# Patient Record
Sex: Female | Born: 1935 | Race: White | Hispanic: No | Smoking: Never smoker
Health system: Southern US, Community
[De-identification: ages and names within clinical notes are randomized; demographics above are authoritative.]

## PROBLEM LIST (undated history)

## (undated) DIAGNOSIS — N6019 Diffuse cystic mastopathy of unspecified breast: Secondary | ICD-10-CM

## (undated) DIAGNOSIS — K4020 Bilateral inguinal hernia, without obstruction or gangrene, not specified as recurrent: Secondary | ICD-10-CM

## (undated) DIAGNOSIS — E782 Mixed hyperlipidemia: Secondary | ICD-10-CM

## (undated) DIAGNOSIS — G5601 Carpal tunnel syndrome, right upper limb: Secondary | ICD-10-CM

## (undated) DIAGNOSIS — M47817 Spondylosis without myelopathy or radiculopathy, lumbosacral region: Secondary | ICD-10-CM

## (undated) DIAGNOSIS — I10 Essential (primary) hypertension: Secondary | ICD-10-CM

## (undated) DIAGNOSIS — K439 Ventral hernia without obstruction or gangrene: Secondary | ICD-10-CM

## (undated) DIAGNOSIS — I341 Nonrheumatic mitral (valve) prolapse: Secondary | ICD-10-CM

## (undated) DIAGNOSIS — E039 Hypothyroidism, unspecified: Secondary | ICD-10-CM

## (undated) DIAGNOSIS — C19 Malignant neoplasm of rectosigmoid junction: Secondary | ICD-10-CM

## (undated) DIAGNOSIS — Z9889 Other specified postprocedural states: Secondary | ICD-10-CM

## (undated) DIAGNOSIS — K458 Other specified abdominal hernia without obstruction or gangrene: Secondary | ICD-10-CM

## (undated) DIAGNOSIS — K635 Polyp of colon: Secondary | ICD-10-CM

## (undated) DIAGNOSIS — J449 Chronic obstructive pulmonary disease, unspecified: Secondary | ICD-10-CM

## (undated) DIAGNOSIS — R112 Nausea with vomiting, unspecified: Secondary | ICD-10-CM

## (undated) DIAGNOSIS — R350 Frequency of micturition: Secondary | ICD-10-CM

## (undated) DIAGNOSIS — N951 Menopausal and female climacteric states: Secondary | ICD-10-CM

## (undated) DIAGNOSIS — Z85828 Personal history of other malignant neoplasm of skin: Secondary | ICD-10-CM

## (undated) HISTORY — DX: Mixed hyperlipidemia: E78.2

## (undated) HISTORY — DX: Nonrheumatic mitral (valve) prolapse: I34.1

## (undated) HISTORY — DX: Diffuse cystic mastopathy of unspecified breast: N60.19

## (undated) HISTORY — DX: Bilateral inguinal hernia, without obstruction or gangrene, not specified as recurrent: K40.20

## (undated) HISTORY — DX: Other specified abdominal hernia without obstruction or gangrene: K45.8

## (undated) HISTORY — DX: Malignant neoplasm of rectosigmoid junction: C19

## (undated) HISTORY — DX: Chronic obstructive pulmonary disease, unspecified: J44.9

## (undated) HISTORY — DX: Essential (primary) hypertension: I10

## (undated) HISTORY — DX: Spondylosis without myelopathy or radiculopathy, lumbosacral region: M47.817

## (undated) HISTORY — DX: Menopausal and female climacteric states: N95.1

## (undated) HISTORY — PX: TONSILLECTOMY: SUR1361

## (undated) HISTORY — PX: APPENDECTOMY: SHX54

## (undated) HISTORY — DX: Polyp of colon: K63.5

## (undated) HISTORY — PX: CATARACT EXTRACTION W/ INTRAOCULAR LENS  IMPLANT, BILATERAL: SHX1307

---

## 1998-11-17 ENCOUNTER — Other Ambulatory Visit: Admission: RE | Admit: 1998-11-17 | Discharge: 1998-11-17 | Payer: Self-pay | Admitting: Family Medicine

## 1999-12-02 ENCOUNTER — Other Ambulatory Visit: Admission: RE | Admit: 1999-12-02 | Discharge: 1999-12-02 | Payer: Self-pay | Admitting: Family Medicine

## 2001-02-25 ENCOUNTER — Other Ambulatory Visit: Admission: RE | Admit: 2001-02-25 | Discharge: 2001-02-25 | Payer: Self-pay | Admitting: Family Medicine

## 2002-10-30 HISTORY — PX: COLON SURGERY: SHX602

## 2003-03-19 ENCOUNTER — Other Ambulatory Visit: Admission: RE | Admit: 2003-03-19 | Discharge: 2003-03-19 | Payer: Self-pay | Admitting: Family Medicine

## 2003-04-16 ENCOUNTER — Ambulatory Visit (HOSPITAL_COMMUNITY): Admission: RE | Admit: 2003-04-16 | Discharge: 2003-04-16 | Payer: Self-pay | Admitting: Gastroenterology

## 2003-04-16 ENCOUNTER — Encounter (INDEPENDENT_AMBULATORY_CARE_PROVIDER_SITE_OTHER): Payer: Self-pay | Admitting: *Deleted

## 2003-05-15 ENCOUNTER — Encounter: Payer: Self-pay | Admitting: General Surgery

## 2003-05-15 ENCOUNTER — Ambulatory Visit (HOSPITAL_COMMUNITY): Admission: RE | Admit: 2003-05-15 | Discharge: 2003-05-15 | Payer: Self-pay | Admitting: General Surgery

## 2003-06-11 ENCOUNTER — Inpatient Hospital Stay (HOSPITAL_COMMUNITY): Admission: RE | Admit: 2003-06-11 | Discharge: 2003-06-20 | Payer: Self-pay | Admitting: General Surgery

## 2003-06-11 ENCOUNTER — Encounter (INDEPENDENT_AMBULATORY_CARE_PROVIDER_SITE_OTHER): Payer: Self-pay | Admitting: Specialist

## 2004-04-04 ENCOUNTER — Other Ambulatory Visit: Admission: RE | Admit: 2004-04-04 | Discharge: 2004-04-04 | Payer: Self-pay | Admitting: Family Medicine

## 2004-07-20 ENCOUNTER — Ambulatory Visit (HOSPITAL_COMMUNITY): Admission: RE | Admit: 2004-07-20 | Discharge: 2004-07-20 | Payer: Self-pay | Admitting: Gastroenterology

## 2004-07-20 ENCOUNTER — Encounter (INDEPENDENT_AMBULATORY_CARE_PROVIDER_SITE_OTHER): Payer: Self-pay | Admitting: *Deleted

## 2006-12-07 ENCOUNTER — Other Ambulatory Visit: Admission: RE | Admit: 2006-12-07 | Discharge: 2006-12-07 | Payer: Self-pay | Admitting: Family Medicine

## 2007-10-17 ENCOUNTER — Ambulatory Visit (HOSPITAL_COMMUNITY): Admission: RE | Admit: 2007-10-17 | Discharge: 2007-10-17 | Payer: Self-pay | Admitting: Ophthalmology

## 2009-07-28 LAB — HM DEXA SCAN

## 2010-03-10 LAB — FECAL OCCULT BLOOD, GUAIAC: Fecal Occult Blood: NEGATIVE

## 2011-02-07 ENCOUNTER — Encounter: Payer: Self-pay | Admitting: Family Medicine

## 2011-02-07 DIAGNOSIS — C19 Malignant neoplasm of rectosigmoid junction: Secondary | ICD-10-CM

## 2011-03-17 NOTE — Op Note (Signed)
Brianna [name], Brianna [name]                           ACCOUNT NO.:  [id number]   MEDICAL RECORD NO.:  [id number]                   PATIENT TYPE:  AMB   LOCATION:  ENDO                                 FACILITY:  [facility]   PHYSICIAN:  Petra Kuba, M.D.                 DATE OF BIRTH:  June 26, 1925   DATE OF PROCEDURE:  05/16/2003  DATE OF DISCHARGE:                                 OPERATIVE REPORT   PROCEDURE:  Colonoscopy with polypectomy.   INDICATIONS FOR PROCEDURE:  Chronic constipation, well overdue  for colonic  screening. Consent was signed  after the risks and benefits, methods and  options were thoroughly discussed in the office.   MEDICATIONS:  Demerol 50, Versed 5.   DESCRIPTION OF PROCEDURE:  The rectal inspection was pertinent for small  external hemorrhoids. The digital examination was negative. The pediatric  video adjustable colonoscope was inserted, and despite a tortuous colon, we  were able to advance to the cecum.   On insertion a large rectal polyp was seen and 2 small  polyps were seen  approximately  at the level of the hepatic flexure. To advance to the cecum  did require rolling her on her back and some abdominal pressure. The cecum  was identified by the appendiceal orifice and the ileocecal valve and we  advanced  into the terminal ileum for a quick look which was normal. The  scope was slowly withdrawn.   The prep was adequate. There was minimal liquid stool that required washing  and suctioning. On slow withdrawal through the colon 2 tiny and small polyps  in the hepatic flexure were seen and hot biopsied x1 and put into the first  container. The scope was further  withdrawn.   In the mid sigmoid at about 30 cm a semi-sessile polyp was seen and snared  x3 and the polyp  was suctioned through the scope and collected in the trap  except for the largest piece which was suctioned onto the head of the scope,  and the scope was withdrawn. We reinserted the scope to  the polypectomy site  and there seemed to be possibly a slight amount of residual polypoid tissue  and 1 hot biopsy of that are was done. These were all put in the 2nd  container.   The scope was then slowly withdrawn back to the rectum and just past the 1st  rectal fold at approximately the sigmoid rectal junction was a large sessile  polyp. We went ahead and took 5 snares of it and pieces were suctioned  through the scope and collected in the trap or suction onto the head of the  scope and removed.   We then went ahead  and took a few hot biopsies of the edge  and put that in  a 4th container. The polyp was not completely removed, but based on the  amount of  cautery done at that junction we elected not to be any further  aggressive, and since we did not know the pathology, elected not to use our  Irby argon plasma coagulator at this time.   The scope was retroflexed which revealed some small internal hemorrhoids.  Again the large rectal polyp was washed and watched. There was no active  bleeding, and again based on the degree of cautery, we elected not  to do  any further therapy. The scope was inserted past the other  polypectomy  site, air was suctioned. The scope was removed.   The patient tolerated the procedure well. There were no obvious immediate  complications.   ENDOSCOPIC DIAGNOSES:  1. Internal and external hemorrhoids.  2. Tortuous colon.  3. Proximal rectal large sessile polyp with 5 snares put in a 3rd container     and the edge hot biopsied and put in the 4th container. It was not     completely removed.  4. Midsigmoid moderately semi-sessile polyp with 3 snares and hot biopsy of     the edge put in the 2nd container.  5. Two tiny small  hepatic flexure polyps, hot biopsied.  6. Otherwise within normal limits to the terminal ileum.   PLAN:  Await pathology to determine surgical options or repeat colonoscopy  with probably using the Irby argon plasma coagulator.  I have discussed post  polypectomy instructions and I have discussed with the patient and her  daughter how there is a small  chance that this dose have some cancerous  cells and we may need to proceed with surgical options, but how I did feel  like there was enough  room for a low anterior resection and not need an  ostomy.                                                 Petra Kuba, M.D.    MEM/MEDQ  D:  04/16/2003  T:  04/17/2003  Job:  [id number]   cc:   Ernestina Penna, M.D.  [address] [place]  [place] [address]  Fax: [phone]

## 2011-03-17 NOTE — Op Note (Signed)
NAMEKRISHANA, Brianna [name]                           ACCOUNT NO.:  [id number]   MEDICAL RECORD NO.:  [id number]                   PATIENT TYPE:  AMB   LOCATION:  DAY                                  FACILITY:  [facility]   PHYSICIAN:  Timothy E. Earlene Plater, M.D.              DATE OF BIRTH:  19-Apr-1925   DATE OF PROCEDURE:  05/16/2003  DATE OF DISCHARGE:                                 OPERATIVE REPORT   PREOPERATIVE DIAGNOSIS:  Villous adenoma of the rectum.   POSTOPERATIVE DIAGNOSIS:  Villous adenoma of the rectum.   PROCEDURES:  1. Examination under anesthesia.  2. Rectal ultrasound.   SURGEON:  Timothy E. Earlene Plater, M.D.   ANESTHESIA:  General.   Brianna [name] was being seen and evaluated by Petra Kuba, M.D., on referral  from Ernestina Penna, M.D., for rectal bleeding, and a colonoscopy was  accomplished revealing multiple villous adenomata; however, a large villous  was found to contain a focal area of intramucosal adenocarcinoma.  The  tissue was pinch biopsies only.  The patient had been seen and evaluated in  the office and is seen today for exam under anesthesia, biopsy if indicated,  and ultrasound.   She was identified and the permit signed and lab data evaluated.   She was taken to the operating room and placed supine, LMA anesthesia  provided.  She was placed in lithotomy.  The perianal area was inspected,  prepped and draped in the usual fashion.  The standard proctoscope was  introduced and advanced with copious lavage, as the patient was not well  cleaned out, and exactly at 14 cm on the right anterior wall of the rectum  was a suspicious-appearing tumor, and grossly it appeared to be carcinoma.  This also appeared to be by gross anatomy to be at the rectosigmoid  junction.  I removed the standard proctoscope, introduced the  ultrasonographic proctoscope, and then introduced the B&K 2201 model  intrarectal ultrasound probe with the water-filled balloon attached.  Multiple  images were made and have been evaluated to indicate a submucosal  component in the right anterior position, as well thickening of the mucosa  as would be expected.  I do not see extension of the tumor beyond this, and  no lymph nodes were clearly identified.  I do believe this, however, to be a  T3 lesion.  The scope was withdrawn and no complications.  The patient was  awakened and taken to the recovery room.   Written and verbal instructions were given her daughter, who attends to her  business with her.  The patient will need a rectosigmoidectomy and low  anterior resection for complete removal of the tumor and clear delineation  of the pathology.  The patient's daughter agrees and understands and will  proceed with scheduling.  Timothy E. Earlene Plater, M.D.    TED/MEDQ  D:  05/15/2003  T:  05/16/2003  Job:  [id number]   cc:   Ernestina Penna, M.D.  [address] [place]  [place] [address]  Fax: [phone]   Petra Kuba, M.D.  [address]  [place]  [place] [address]  Fax: [phone]

## 2011-03-17 NOTE — Op Note (Signed)
Brianna [name], Brianna [name]               ACCOUNT NO.:  [id number]   MEDICAL RECORD NO.:  [id number]          PATIENT TYPE:  AMB   LOCATION:  ENDO                         FACILITY:  [facility]   PHYSICIAN:  Petra Kuba, M.D.    DATE OF BIRTH:  [birth year]   DATE OF PROCEDURE:  07/20/2004  DATE OF DISCHARGE:                                 OPERATIVE REPORT   PROCEDURE:  Colonoscopy with hot biopsy.   INDICATIONS:  Patient with a history of colon cancer and colon polyps, due  for repeat screening.   Consent was signed after risks, benefits, methods, options thoroughly  discussed in the office.   MEDICINES USED:  Demerol 50, Versed 5.   PROCEDURE:  Rectal inspection is pertinent for external hemorrhoids, small.  Digital exam was negative.  The video pediatric adjustable colonoscope was  inserted and easily advanced around the colon to the cecum.  The anastomosis  was seen in the distal sigmoid.  We did require some abdominal pressure to  advance to the cecum.  No obvious abnormality was seen on insertion.  The  cecum was identified by the appendiceal orifice and the ileocecal valve.  The prep was adequate.  There was some liquid stool that required washing  and suctioning.  In the ascending colon, three small sessile polyps were  seen and were all hot biopsied x1-2 and put in the same container.  In the  hepatic flexure and proximal transverse, two additional small polyps were  seen and were hot biopsied as well and put in the same containers.  The  scope was slowly withdrawn.  There was a rare left-sided diverticula seen.  Two tiny mid descending and distal sigmoid polyps were seen, were hot  biopsied, and put in a second container.  The anastomosis appeared normal;  however, there were some slightly raised folds that we went ahead and  biopsied, probably from the sutures.  They were put in the third container.  Anorectal pull-through and retroflexion confirmed some small hemorrhoids.  The  scope was straightened and readvanced a shortways up the left side of  the colon, air was suctioned, and the scope removed.  Patient tolerated the  procedure well.  There was no obvious immediate complication.   ENDOSCOPIC DIAGNOSES:  1.  Internal/external hemorrhoids.  2.  Rare left-sided diverticula.  3.  Sigmoid distal anastomosis, status post biopsy.  4.  Two tiny distal sigmoid and descending polyps, hot biopsied.  5.  Right-sided small sessile polyps, 5-6, hot biopsied.  6.  Otherwise within normal limits to the cecum.   PLAN:  Await pathology to determine future colonic screening.  Happy to see  back p.r.n., otherwise return to the care of Dr. Christell Constant for the customary  health-care maintenance to include yearly rectals and guaiacs.      MEM/MEDQ  D:  07/20/2004  T:  07/20/2004  Job:  [id number]   cc:   Ernestina Penna, M.D.  [address] [place]  [place] [address]  Fax: [phone]   Sheppard Plumber. Earlene Plater, M.D.  [address]  [place]  [place] [address]  Fax: [phone]

## 2011-03-17 NOTE — Op Note (Signed)
Brianna [name], Brianna [name]                           ACCOUNT NO.:  [id number]   MEDICAL RECORD NO.:  [id number]                   PATIENT TYPE:  INP   LOCATION:  X003                                 FACILITY:  [facility]   PHYSICIAN:  Timothy E. Earlene Plater, M.D.              DATE OF BIRTH:  08/22/1925   DATE OF PROCEDURE:  06/11/2003  DATE OF DISCHARGE:                                 OPERATIVE REPORT   PREOPERATIVE DIAGNOSIS:  Carcinoma of the rectum.   POSTOPERATIVE DIAGNOSIS:  Carcinoma of the rectum.   OPERATION PERFORMED:  Rigid sigmoidoscopy with tattoo and low anterior  resection of a rectal carcinoma with a primary anastomosis.   SURGEON:  Timothy E. Earlene Plater, M.D.   ASSISTANT:  Currie Paris, M.D.   ANESTHESIA:  CRNA supervised M.D.   INDICATIONS FOR PROCEDURE:  This is a 77 year old Caucasian female who sees  Dr. Vernon Prey.  She had a routine colonoscopy by Dr. Vida Rigger in May, a  number of tubular adenomas were excised during colonoscopy and one tumor of  the rectum was excised in fragments and showed superficial adenocarcinoma.  The patient has undergone staging by both ultrasonography and scans and is  ready to proceed with a definitive resection.  She was prepared at home, had  considerable nausea, was brought in early this morning.  An IV was started.  Fluids were given.  She was seen, evaluated by anesthesia.  Her laboratory  data are all normal including a normal CEA level.  She said she is ready to  proceed with surgery.  She is identified and the permit signed.   DESCRIPTION OF PROCEDURE:  The patient was taken to the operating room and  placed supine.  General endotracheal anesthesia administered.  PAS hose were  in place.  Nasogastric tube was placed.  A Foley catheter was placed.  The  patient was placed in lithotomy position.  Sigmoidoscopy accomplished.  The  tumor identified at exactly 15 cm on rigid proctoscopy.  Tattooing was  accomplished and the scope  withdrawn.  The patient's perineum was prepped  and then the legs were lowered and then the abdomen was prepped separately.  A long lower abdominal midline incision was made.  The abdomen was entered.  There were no adhesions, careful evaluation of the remainder of the colon  with small bowel was negative for any other masses or tumors.  The appendix  was absent.  The internal genitalia were present.  There was polypoid masses  on the uterus.  The ovaries were atrophic.  The tumor was palpable on the  anterior surface of the highest portion of the rectum at its junction with  the sigmoid colon.  Likewise a small amount of tattoo stain could be seen.  This area was marked.  The liver, gallbladder and upper abdomen were felt to  be normal and the NG tube was positioned.  The  uterus was suspended  inferiorly.  The sigmoid colon was remarkably redundant and because of this,  we decided to resect additional sigmoid colon that would ordinarily be  necessary to prevent any malrotation postoperatively.  The ureters were both  visible through the peritoneum and were kept lateral to the dissection.  The  peritoneum was scored from 4 cm below the tumor along the pelvic rim and up  to a point on the sigmoid colon that was at the proximal sigmoid.  Once that  was accomplished, the area of the sigmoid colon destined for resection was  divided between clamps.  The ends were washed with dilute Betadine and then  the mesentery was divided between clamps carefully down to sacral promontory  and then down into the sacral hollow beyond the tumor distally.  All of  these were securely tied with 2-0 silks.  Then the rectum was carefully  cleared of mesentery at a distance we estimated to be 4 cm below the tumor.  A clamp was placed approximately 6 to 8 cm below the tumor and the rectum  was then divided at what we estimated to be 3 to 4 cm below the tumor.  Specimen was passed off the field.  I did open the  distal specimen and the  tumor was included with the specimen.  It was sent fresh to pathology.  So  the remaining sigmoid easily reached the midpelvis and under direct vision,  a handsewn anastomosis was carried out by placing marker sutures of 2-0 silk  approximating the ends of the bowel and then intervening 3-0 silks were  placed.  The lateral walls of the back margin were placed first and then all  tied at one time.  The colon lay nicely without tension.  Then the corners  and anterior surface were closed in a like fashion.  The anastomosis  appeared well, it was manually and visually inspected and then I went below,  passed a proctoscope.  The new anastomosis lay at about 10 cm and with a  clamp on the descending colon, air was insufflated into the rectum and  sigmoid and there was no air leak.  This was done, of course with saline  filling the pelvis.  The sigmoidoscope was removed.  Instruments and gloves  changed.  The counts were reported as preliminary correct.  The anastomosis  lay nicely in the midpelvis.  The peritoneum could be easily closed on the  medial pelvis and this was done with silk sutures.  All areas were checked  and all the bowel was brought back into its normal anatomic position and all  final counts were correct.  The abdomen was closed in a single layer with #1  PDS suture.  Subcutaneous irrigated.  Skin closed with wide skin staples.  The patient tolerated the procedure well and was removed to the recovery  room in good condition.                                                Timothy E. Earlene Plater, M.D.    TED/MEDQ  D:  06/11/2003  T:  06/11/2003  Job:  [id number]   cc:   Petra Kuba, M.D.  [address]  [place]  [place] [address]  Fax: [phone]   Ernestina Penna, M.D.  [address]  [place]  [place] [address]  Fax: [phone]

## 2011-03-17 NOTE — Discharge Summary (Signed)
   NAMECRISLYN, [name]                           ACCOUNT NO.:  [id number]   MEDICAL RECORD NO.:  [id number]                   PATIENT TYPE:  INP   LOCATION:  0352                                 [facility]ACILITY:  [facility]   PHYSICIAN:  Timothy E. Earlene Plater, M.D.              DATE OF BIRTH:  December 25, 1924   DATE OF ADMISSION:  06/11/2003  DATE OF DISCHARGE:  06/20/2003                                 DISCHARGE SUMMARY   FINAL DIAGNOSES:  Carcinoma rectosigmoid.   HISTORY:  See the enclosed notes.  The patient was seen and evaluated by  Ernestina Penna, M.D., Petra Kuba, M.D.  Colonoscopy showed carcinoma in  the rectosigmoid area.  She was appropriately counseled and prepared for  this surgery.  She was admitted on the day of surgery following outpatient  prep.  Her admitting laboratory data showed a normal CBC, normal chemistry  profile, CEA level 1.7.   She underwent surgery including sigmoidoscopy with tattooing of tumor, low  anterior resection of a high rectal carcinoma with a primary anastomosis.  The patient remained in good spirits, remained alert and appropriate with  satisfactory postoperative progress in terms of bowel function.  She was  ambulatory.  Incision remained clean.  Bowel action did not resume until the  sixth postoperative day when she was tolerating liquids.  Laboratory data  were rechecked at that time and she was advanced on diet and ready for  discharge on August 21.  Staples out.  Steri-Strips applied.  Careful  instructions given.  Vicodin.  Assignment was made for postoperative follow-  up.  A postoperative CBC was also normal.  Postoperative chemistries were  followed.   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  The patient will be  appropriately consulted and appropriate follow-up recommended.                                               Timothy E. Earlene Plater, M.D.    TED/MEDQ  D:   07/07/2003  T:  07/07/2003  Job:  [id number]   cc:   Ernestina Penna, M.D.  [address] [place]  [place] [address]  Fax: [phone]   Petra Kuba, M.D.  [address]  [place]  [place] [address]  Fax: [phone]

## 2011-05-22 ENCOUNTER — Ambulatory Visit: Payer: Medicare Other | Attending: Orthopedic Surgery | Admitting: Physical Therapy

## 2011-05-22 DIAGNOSIS — M545 Low back pain, unspecified: Secondary | ICD-10-CM | POA: Insufficient documentation

## 2011-05-22 DIAGNOSIS — IMO0001 Reserved for inherently not codable concepts without codable children: Secondary | ICD-10-CM | POA: Insufficient documentation

## 2011-05-22 DIAGNOSIS — R5381 Other malaise: Secondary | ICD-10-CM | POA: Insufficient documentation

## 2011-05-22 DIAGNOSIS — R293 Abnormal posture: Secondary | ICD-10-CM | POA: Insufficient documentation

## 2011-05-29 ENCOUNTER — Ambulatory Visit: Payer: Medicare Other | Admitting: Physical Therapy

## 2011-08-07 LAB — BASIC METABOLIC PANEL
Chloride: 101
Creatinine, Ser: 0.83
GFR calc Af Amer: >60
GFR calc non Af Amer: >60
Potassium: 4.1

## 2011-08-07 LAB — HEMOGLOBIN AND HEMATOCRIT, BLOOD
HCT: 36.9
Hemoglobin: 12.4

## 2011-10-06 ENCOUNTER — Encounter (INDEPENDENT_AMBULATORY_CARE_PROVIDER_SITE_OTHER): Payer: Self-pay | Admitting: Surgery

## 2011-10-12 ENCOUNTER — Encounter (INDEPENDENT_AMBULATORY_CARE_PROVIDER_SITE_OTHER): Payer: Self-pay | Admitting: Surgery

## 2011-10-12 ENCOUNTER — Ambulatory Visit (INDEPENDENT_AMBULATORY_CARE_PROVIDER_SITE_OTHER): Payer: Medicare Other | Admitting: Surgery

## 2011-10-12 VITALS — BP 154/96 | HR 76 | Temp 96.9°F | Resp 16 | Ht 60.5 in | Wt 116.4 lb

## 2011-10-12 DIAGNOSIS — M1990 Unspecified osteoarthritis, unspecified site: Secondary | ICD-10-CM | POA: Insufficient documentation

## 2011-10-12 DIAGNOSIS — K432 Incisional hernia without obstruction or gangrene: Secondary | ICD-10-CM | POA: Insufficient documentation

## 2011-10-12 NOTE — Patient Instructions (Signed)
Hernia Repair with Laparoscope A hernia occurs when an internal organ pushes out through a weak spot in the belly (abdominal) wall muscles. Hernias most commonly occur in the groin and around the navel. Hernias can also occur through a cut by the surgeon (incision) after an abdominal operation. A hernia may be caused by:  Lifting heavy objects.   Prolonged coughing.   Straining to move your bowels.  Hernias can often be pushed back into place (reduced). Most hernias tend to get worse over time. Problems occur when abdominal contents get stuck in the opening and the blood supply is blocked or impaired (incarcerated hernia). Because of these risks, you require surgery to repair the hernia. Your hernia will be repaired using a laparoscope. Laparoscopic surgery is a type of minimally invasive surgery. It does not involve making a typical surgical cut (incision) in the skin. A laparoscope is a telescope-like rod and lens system. It is usually connected to a video camera and a light source so your caregiver can clearly see the operative area. The instruments are inserted through  to  inch (5 mm or 10 mm) openings in the skin at specific locations. A working and viewing space is created by blowing a small amount of carbon dioxide gas into the abdominal cavity. The abdomen is essentially blown up like a balloon (insufflated). This elevates the abdominal wall above the internal organs like a dome. The carbon dioxide gas is common to the human body and can be absorbed by tissue and removed by the respiratory system. Once the repair is completed, the small incisions will be closed with either stitches (sutures) or staples (just like a paper stapler only this staple holds the skin together). LET YOUR CAREGIVERS KNOW ABOUT:  Allergies.   Medications taken including herbs, eye drops, over the counter medications, and creams.   Use of steroids (by mouth or creams).   Previous problems with anesthetics or  Novocaine.   Possibility of pregnancy, if this applies.   History of blood clots (thrombophlebitis).   History of bleeding or blood problems.   Previous surgery.   Other health problems.  BEFORE THE PROCEDURE  Laparoscopy can be done either in a hospital or out-patient clinic. You may be given a mild sedative to help you relax before the procedure. Once in the operating room, you will be given a general anesthesia to make you sleep (unless you and your caregiver choose a different anesthetic).  AFTER THE PROCEDURE  After the procedure you will be watched in a recovery area. Depending on what type of hernia was repaired, you might be admitted to the hospital or you might go home the same day. With this procedure you may have less pain and scarring. This usually results in a quicker recovery and less risk of infection. HOME CARE INSTRUCTIONS   Bed rest is not required. You may continue your normal activities but avoid heavy lifting (more than 10 pounds) or straining.   Cough gently. If you are a smoker it is best to stop, as even the best hernia repair can break down with the continual strain of coughing.   Avoid driving until given the OK by your surgeon.   There are no dietary restrictions unless given otherwise.   TAKE ALL MEDICATIONS AS DIRECTED.   Only take over-the-counter or prescription medicines for pain, discomfort, or fever as directed by your caregiver.  SEEK MEDICAL CARE IF:   There is increasing abdominal pain or pain in your incisions.     There is more bleeding from incisions, other than minimal spotting.   You feel light headed or faint.   You develop an unexplained fever, chills, and/or an oral temperature above 102 F (38.9 C).   You have redness, swelling, or increasing pain in the wound.   Pus coming from wound.   A foul smell coming from the wound or dressings.  SEEK IMMEDIATE MEDICAL CARE IF:   You develop a rash.   You have difficulty breathing.    You have any allergic problems.  MAKE SURE YOU:   Understand these instructions.   Will watch your condition.   Will get help right away if you are not doing well or get worse.  Document Released: 10/16/2005 Document Revised: 06/28/2011 Document Reviewed: 09/15/2009 [employer] Patient Information 2012 [employer]. 

## 2011-10-12 NOTE — Progress Notes (Signed)
Subjective:     Patient ID: Brianna [name], female   DOB: [birth year], 77 y.o.   MRN: [id number]  HPI  JESSENIA FILIPPONE  [birth year] [id number]  Patient Care Team: Monica Becton, MD as PCP - General (Family Medicine)  This patient is a 77 y.o.female who presents today for surgical evaluation at the request of Dr. Christell Constant.   Reason for visit: Bulging around bellybutton. A probable hernia.  Patient is an elderly but active woman who was found to have a mass near her belly button. Her primary care physician was concerned about a hernia. Patient does note it is sensitive to touch and uncomfortable with pressure on it. No episodes of severe pain. No nausea or vomiting.  She had been open low anterior resection for ealy Stage 1 cancer within a polyp in 2004. No fevers chills or sweats.  She cannot walk to 4 due to her arthritis. However, she does do home exercise as such it leg lifts etc. a couple times a day. No history of heart attack strokes. Rather active.  Patient Active Problem List  Diagnoses  . Rectosigmoid cancer    Past Medical History  Diagnosis Date  . Other and unspecified hyperlipidemia   . Lumbosacral spondylosis without myelopathy   . Fibrocystic breast   . Osteoporosis   . Symptomatic menopausal or female climacteric states   . COPD (chronic obstructive pulmonary disease)   . Colon polyp   . Hypertension   . 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 Cesarean section   . Tonsillectomy   . Appendectomy   . Cataract extraction w/ intraocular lens  implant, bilateral   . Colon surgery 2004    open LAR for rectosigmoid cancer T1N0 within polyp    History   Social History  . Marital Status: Widowed    Spouse Name: N/A    Number of Children: N/A  . Years of Education: N/A   Occupational History  . Not on file.   Social History Main Topics  . Smoking status: Never Smoker   . Smokeless tobacco: Not on file  . Alcohol  Use: No  . Drug Use: No  . Sexually Active:    Other Topics Concern  . Not on file   Social History Narrative  . No narrative on file    Family History  Problem Relation Age of Onset  . Heart disease Mother   . Heart disease Father   . Cancer Brother     lung    Current outpatient prescriptions:aspirin 81 MG EC tablet, Take 81 mg by mouth daily.  , Disp: , Rfl: ;  atorvastatin (LIPITOR) 10 MG tablet, Take 10 mg by mouth daily.  , Disp: , Rfl: ;  beta carotene w/minerals (OCUVITE) tablet, Take 1 tablet by mouth daily.  , Disp: , Rfl: ;  calcium citrate-vitamin D (CITRACAL+D) 315-200 MG-UNIT per tablet, Take 1 tablet by mouth daily.  , Disp: , Rfl:  Cholecalciferol (VITAMIN D3) 2000 UNITS TABS, Take 1 tablet by mouth. 1 tab qd except 2 tabs on sat and sun , Disp: , Rfl: ;  levothyroxine (SYNTHROID, LEVOTHROID) 25 MCG tablet, Take 25 mcg by mouth daily.  , Disp: , Rfl: ;  meloxicam (MOBIC) 15 MG tablet, Ad lib., Disp: , Rfl: ;  valsartan (DIOVAN) 160 MG tablet, Take 160 mg by mouth daily.  , Disp: , Rfl:   Allergies  Allergen Reactions  .  Actonel   . Fosamax Nausea Only  . Lodine (Etodolac)     BP 154/96  Pulse 76  Temp(Src) 96.9 F (36.1 C) (Temporal)  Resp 16  Ht 5' 0.5" (1.537 m)  Wt 116 lb 6.4 oz (52.799 kg)  BMI 22.36 kg/m2     Review of Systems  Constitutional: Negative for fever, chills, diaphoresis, appetite change and fatigue.  HENT: Negative for ear pain, sore throat, trouble swallowing, neck pain and ear discharge.   Eyes: Negative for photophobia, discharge and visual disturbance.  Respiratory: Negative for cough, choking, chest tightness and shortness of breath.   Cardiovascular: Negative for chest pain and palpitations.  Gastrointestinal: Negative for nausea, vomiting, diarrhea, constipation, blood in stool, abdominal distention, anal bleeding and rectal pain.       Mild sensitivity/disconfort at abd bulge/hernia  Genitourinary: Negative for dysuria,  frequency and difficulty urinating.  Musculoskeletal: Positive for arthralgias. Negative for myalgias and gait problem.  Skin: Negative for color change, pallor and rash.  Neurological: Negative for dizziness, speech difficulty, weakness and numbness.  Hematological: Negative for adenopathy.  Psychiatric/Behavioral: Negative for confusion and agitation. The patient is not nervous/anxious.        Objective:   Physical Exam  Constitutional: She is oriented to person, place, and time. She appears well-developed and well-nourished. No distress.  HENT:  Head: Normocephalic.  Mouth/Throat: Oropharynx is clear and moist. No oropharyngeal exudate.  Eyes: Conjunctivae and EOM are normal. Pupils are equal, round, and reactive to light. No scleral icterus.  Neck: Normal range of motion. Neck supple. No tracheal deviation present.  Cardiovascular: Normal rate, regular rhythm and intact distal pulses.   Pulmonary/Chest: Effort normal and breath sounds normal. No respiratory distress. She exhibits no tenderness.  Abdominal: Soft. She exhibits no distension. There is no tenderness. There is no rebound and no guarding. Hernia confirmed negative in the right inguinal area and confirmed negative in the left inguinal area.       Low midline incision  Periumbilcal 6x6cm mass reduces to 2x3cm VWH  Genitourinary: No vaginal discharge found.  Musculoskeletal: Normal range of motion. She exhibits no tenderness.  Lymphadenopathy:    She has no cervical adenopathy.       Right: No inguinal adenopathy present.       Left: No inguinal adenopathy present.  Neurological: She is alert and oriented to person, place, and time. No cranial nerve deficit. She exhibits normal muscle tone. Coordination normal.  Skin: Skin is warm and dry. No rash noted. She is not diaphoretic. No erythema.  Psychiatric: She has a normal mood and affect. Her behavior is normal. Judgment and thought content normal.       Assessment:       VWH incisional in elderly but active female    Plan:     Given the fact that I strongly suspect a loop of small bowel is up in this hernia, and she is sensitive at the area; I think she would benefit from surgical repair. I think she'll be a good candidate for a laparoscopic approach with an underlay mesh. She may be able to leave that day. However, given her advanced age and likelihood of a moderate-sized mesh to fix this, I would plan for an overnight stay at the least. She is some borderline on her daughter for help postoperatively. Her daughter would like to have this done on Friday if possible.  The anatomy & physiology of the abdominal wall was discussed.  The pathophysiology of hernias was  discussed.  Natural history risks without surgery of enlargement, pain, incarceration & strangulation was discussed.   Contributors to complications such as smoking, obesity, diabetes, prior surgery, etc were discussed.  I feel the risks of no intervention will lead to serious problems that outweigh the operative risks; therefore, I recommended surgery to reduce and repair the hernia.  I explained laparoscopic techniques with possible need for an open approach.  I noted the probable use of mesh to patch and/or buttress hernia repair  Risks such as bleeding, infection, abscess, need for further treatment, heart attack, death, and other risks were discussed.  I noted a good likelihood this will help address the problem.   Goals of post-operative recovery were discussed as well.  Possibility that this will not correct all symptoms was explained.  I stressed the importance of low-impact activity, aggressive pain control, avoiding constipation, & not pushing through pain to minimize risk of post-operative chronic pain or injury. Possibility of reherniation was discussed.  We will work to minimize complications.   An educational handout further explaining the pathology & treatment options was given as well.  Questions  were answered.  The patient expresses understanding & wishes to proceed with surgery.

## 2011-10-18 ENCOUNTER — Other Ambulatory Visit (INDEPENDENT_AMBULATORY_CARE_PROVIDER_SITE_OTHER): Payer: Self-pay | Admitting: Surgery

## 2011-11-07 ENCOUNTER — Encounter (HOSPITAL_COMMUNITY): Payer: Self-pay | Admitting: Pharmacy Technician

## 2011-11-15 ENCOUNTER — Ambulatory Visit (HOSPITAL_COMMUNITY)
Admission: RE | Admit: 2011-11-15 | Discharge: 2011-11-15 | Disposition: A | Discharge status: Home / Self Care | Payer: Medicare Other | Source: Ambulatory Visit | Attending: Surgery | Admitting: Surgery

## 2011-11-15 ENCOUNTER — Encounter (HOSPITAL_COMMUNITY): Payer: Self-pay

## 2011-11-15 ENCOUNTER — Encounter (HOSPITAL_COMMUNITY)
Admission: RE | Admit: 2011-11-15 | Discharge: 2011-11-15 | Disposition: A | Discharge status: Home / Self Care | Payer: Medicare Other | Source: Ambulatory Visit | Attending: Surgery | Admitting: Surgery

## 2011-11-15 ENCOUNTER — Other Ambulatory Visit: Payer: Self-pay

## 2011-11-15 DIAGNOSIS — I10 Essential (primary) hypertension: Secondary | ICD-10-CM | POA: Insufficient documentation

## 2011-11-15 DIAGNOSIS — Z01810 Encounter for preprocedural cardiovascular examination: Secondary | ICD-10-CM | POA: Insufficient documentation

## 2011-11-15 DIAGNOSIS — R9431 Abnormal electrocardiogram [ECG] [EKG]: Secondary | ICD-10-CM | POA: Insufficient documentation

## 2011-11-15 DIAGNOSIS — I77819 Aortic ectasia, unspecified site: Secondary | ICD-10-CM | POA: Insufficient documentation

## 2011-11-15 DIAGNOSIS — Z01812 Encounter for preprocedural laboratory examination: Secondary | ICD-10-CM | POA: Insufficient documentation

## 2011-11-15 DIAGNOSIS — I499 Cardiac arrhythmia, unspecified: Secondary | ICD-10-CM | POA: Insufficient documentation

## 2011-11-15 DIAGNOSIS — Z01818 Encounter for other preprocedural examination: Secondary | ICD-10-CM | POA: Insufficient documentation

## 2011-11-15 HISTORY — DX: Hypothyroidism, unspecified: E03.9

## 2011-11-15 HISTORY — DX: Ventral hernia without obstruction or gangrene: K43.9

## 2011-11-15 HISTORY — DX: Nausea with vomiting, unspecified: R11.2

## 2011-11-15 HISTORY — DX: Other specified postprocedural states: Z98.890

## 2011-11-15 LAB — BASIC METABOLIC PANEL
BUN: 17 mg/dL (ref 6–23)
Chloride: 102 mEq/L (ref 96–112)
Creatinine, Ser: 0.84 mg/dL (ref 0.50–1.10)
GFR calc Af Amer: 71 mL/min — ABNORMAL LOW (ref >90)
GFR calc non Af Amer: 62 mL/min — ABNORMAL LOW (ref >90)

## 2011-11-15 LAB — CBC
HCT: 36.5 % (ref 36.0–46.0)
MCH: 30.2 pg (ref 26.0–34.0)
MCHC: 32.9 g/dL (ref 30.0–36.0)
MCV: 91.9 fL (ref 78.0–100.0)
RDW: 13.3 % (ref 11.5–15.5)

## 2011-11-15 NOTE — Pre-Procedure Instructions (Signed)
11-15-11 EKG/ CXR  To be done today.

## 2011-11-15 NOTE — Patient Instructions (Addendum)
22 Brianna [name]  11/15/2011   Your procedure is scheduled on: 11-21-11  Report to [facility] at 0815 AM.  Call this number if you have problems the morning of surgery: [phone]   Remember:   Do not eat food:After Midnight.  May have clear liquids:until Midnight .  Clear liquids include soda, tea, black coffee, apple or grape juice, broth.  Take these medicines the morning of surgery with A SIP OF WATER: Synthroid only AM of surgery.    Take Diovan, Lipitor, Tylenol as usual, but not am of.   Do not wear jewelry, make-up or nail polish.  Do not wear lotions, powders, or perfumes. You may wear deodorant.  Do not shave 48 hours prior to surgery.  Do not bring valuables to the hospital.  Contacts, dentures or bridgework may not be worn into surgery.  Leave suitcase in the car. After surgery it may be brought to your room.  For patients admitted to the hospital, checkout time is 11:00 AM the day of discharge.   Patients discharged the day of surgery will not be allowed to drive home.  Name and phone number of your driver: Daughter- [name]  Special Instructions: CHG Shower Use Special Wash: 1/2 bottle night before surgery and 1/2 bottle morning of surgery.   Please read over the following fact sheets that you were given: MRSA Information

## 2011-11-21 ENCOUNTER — Ambulatory Visit (HOSPITAL_COMMUNITY): Payer: Medicare Other | Admitting: *Deleted

## 2011-11-21 ENCOUNTER — Encounter (HOSPITAL_COMMUNITY): Payer: Self-pay | Admitting: *Deleted

## 2011-11-21 ENCOUNTER — Inpatient Hospital Stay (HOSPITAL_COMMUNITY)
Admission: AD | Admit: 2011-11-21 | Discharge: 2011-11-23 | DRG: 337 | Disposition: A | Discharge status: Home / Self Care | Payer: Medicare Other | Source: Ambulatory Visit | Attending: Surgery | Admitting: Surgery

## 2011-11-21 ENCOUNTER — Encounter (HOSPITAL_COMMUNITY)
Admission: AD | Disposition: A | Discharge status: Home / Self Care | Payer: Self-pay | Source: Ambulatory Visit | Attending: Surgery

## 2011-11-21 DIAGNOSIS — K660 Peritoneal adhesions (postprocedural) (postinfection): Secondary | ICD-10-CM | POA: Diagnosis present

## 2011-11-21 DIAGNOSIS — K450 Other specified abdominal hernia with obstruction, without gangrene: Secondary | ICD-10-CM | POA: Diagnosis present

## 2011-11-21 DIAGNOSIS — K432 Incisional hernia without obstruction or gangrene: Principal | ICD-10-CM | POA: Diagnosis present

## 2011-11-21 DIAGNOSIS — K4020 Bilateral inguinal hernia, without obstruction or gangrene, not specified as recurrent: Secondary | ICD-10-CM | POA: Diagnosis present

## 2011-11-21 HISTORY — PX: INGUINAL HERNIA REPAIR: SHX194

## 2011-11-21 HISTORY — PX: LAPAROSCOPY: SHX197

## 2011-11-21 HISTORY — PX: VENTRAL HERNIA REPAIR: SHX424

## 2011-11-21 LAB — CBC
Hemoglobin: 10.8 g/dL — ABNORMAL LOW (ref 12.0–15.0)
MCHC: 31.7 g/dL (ref 30.0–36.0)
WBC: 11 10*3/uL — ABNORMAL HIGH (ref 4.0–10.5)

## 2011-11-21 LAB — CREATININE, SERUM
Creatinine, Ser: 0.76 mg/dL (ref 0.50–1.10)
GFR calc Af Amer: 87 mL/min — ABNORMAL LOW (ref >90)
GFR calc non Af Amer: 75 mL/min — ABNORMAL LOW (ref >90)

## 2011-11-21 SURGERY — REPAIR, HERNIA, VENTRAL, LAPAROSCOPIC
Anesthesia: General | Site: Abdomen

## 2011-11-21 MED ORDER — BUPIVACAINE-EPINEPHRINE 0.25% -1:200000 IJ SOLN
INTRAMUSCULAR | Status: DC | PRN
  Administered 2011-11-21: 50 mL

## 2011-11-21 MED ORDER — ASPIRIN 81 MG PO CHEW
81.0000 mg | CHEWABLE_TABLET | ORAL | Status: DC
  Administered 2011-11-22: 81 mg via ORAL
  Filled 2011-11-21: qty 1

## 2011-11-21 MED ORDER — LACTATED RINGERS IR SOLN
Status: DC | PRN
  Administered 2011-11-21: 1000 mL

## 2011-11-21 MED ORDER — OCUVITE PO TABS
1.0000 | ORAL_TABLET | Freq: Every day | ORAL | Status: DC
  Administered 2011-11-21 – 2011-11-22 (×2): 1 via ORAL
  Filled 2011-11-21 (×4): qty 1

## 2011-11-21 MED ORDER — DROPERIDOL 2.5 MG/ML IJ SOLN
INTRAMUSCULAR | Status: DC | PRN
  Administered 2011-11-21: 0.625 mg via INTRAVENOUS

## 2011-11-21 MED ORDER — BISACODYL 10 MG RE SUPP: 10.0000 mg | Freq: Two times a day (BID) | RECTAL | Status: DC | PRN

## 2011-11-21 MED ORDER — EPHEDRINE SULFATE 50 MG/ML IJ SOLN
INTRAMUSCULAR | Status: DC | PRN
  Administered 2011-11-21 (×2): 5 mg via INTRAVENOUS

## 2011-11-21 MED ORDER — PSYLLIUM 95 % PO PACK
1.0000 | PACK | Freq: Two times a day (BID) | ORAL | Status: DC
  Administered 2011-11-21 – 2011-11-23 (×4): 1 via ORAL
  Filled 2011-11-21 (×5): qty 1

## 2011-11-21 MED ORDER — HEPARIN SODIUM (PORCINE) 5000 UNIT/ML IJ SOLN
5000.0000 [IU] | Freq: Three times a day (TID) | INTRAMUSCULAR | Status: DC
  Administered 2011-11-22 – 2011-11-23 (×4): 5000 [IU] via SUBCUTANEOUS
  Filled 2011-11-21 (×8): qty 1

## 2011-11-21 MED ORDER — ALUM & MAG HYDROXIDE-SIMETH 200-200-20 MG/5ML PO SUSP: 30.0000 mL | Freq: Four times a day (QID) | ORAL | Status: DC | PRN

## 2011-11-21 MED ORDER — IBUPROFEN-DIPHENHYDRAMINE HCL 200-25 MG PO CAPS: 1.0000 | ORAL_CAPSULE | Freq: Every day | ORAL | Status: DC

## 2011-11-21 MED ORDER — ONDANSETRON HCL 4 MG/2ML IJ SOLN
INTRAMUSCULAR | Status: DC | PRN
  Administered 2011-11-21: 4 mg via INTRAVENOUS

## 2011-11-21 MED ORDER — GLYCOPYRROLATE 0.2 MG/ML IJ SOLN
INTRAMUSCULAR | Status: DC | PRN
  Administered 2011-11-21: .3 mg via INTRAVENOUS

## 2011-11-21 MED ORDER — LACTATED RINGERS IV SOLN: INTRAVENOUS | Status: DC

## 2011-11-21 MED ORDER — MUSCLE RUB 10-15 % EX CREA
TOPICAL_CREAM | Freq: Two times a day (BID) | CUTANEOUS | Status: DC | PRN
  Filled 2011-11-21: qty 85

## 2011-11-21 MED ORDER — MELOXICAM 15 MG PO TABS
15.0000 mg | ORAL_TABLET | Freq: Every day | ORAL | Status: DC | PRN
  Filled 2011-11-21: qty 1

## 2011-11-21 MED ORDER — GUAIFENESIN-DM 100-10 MG/5ML PO SYRP: 15.0000 mL | ORAL_SOLUTION | ORAL | Status: DC | PRN

## 2011-11-21 MED ORDER — DIPHENHYDRAMINE HCL 25 MG PO CAPS
25.0000 mg | ORAL_CAPSULE | Freq: Every day | ORAL | Status: DC
  Administered 2011-11-21 – 2011-11-22 (×2): 25 mg via ORAL
  Filled 2011-11-21 (×3): qty 1

## 2011-11-21 MED ORDER — OLMESARTAN MEDOXOMIL 20 MG PO TABS
20.0000 mg | ORAL_TABLET | Freq: Every day | ORAL | Status: DC
  Administered 2011-11-21 – 2011-11-23 (×3): 20 mg via ORAL
  Filled 2011-11-21 (×3): qty 1

## 2011-11-21 MED ORDER — PROMETHAZINE HCL 25 MG/ML IJ SOLN: 6.2500 mg | INTRAMUSCULAR | Status: DC | PRN

## 2011-11-21 MED ORDER — METOPROLOL TARTRATE 1 MG/ML IV SOLN
5.0000 mg | Freq: Four times a day (QID) | INTRAVENOUS | Status: DC | PRN
  Administered 2011-11-22: 5 mg via INTRAVENOUS
  Filled 2011-11-21: qty 5

## 2011-11-21 MED ORDER — STERILE WATER FOR IRRIGATION IR SOLN
Status: DC | PRN
  Administered 2011-11-21: 1500 mL

## 2011-11-21 MED ORDER — CEFAZOLIN SODIUM-DEXTROSE 2-3 GM-% IV SOLR
2.0000 g | INTRAVENOUS | Status: AC
  Administered 2011-11-21: 1 g via INTRAVENOUS

## 2011-11-21 MED ORDER — FENTANYL CITRATE 0.05 MG/ML IJ SOLN
INTRAMUSCULAR | Status: DC | PRN
  Administered 2011-11-21 (×2): 50 ug via INTRAVENOUS
  Administered 2011-11-21: 100 ug via INTRAVENOUS
  Administered 2011-11-21: 50 ug via INTRAVENOUS

## 2011-11-21 MED ORDER — PROPOFOL 10 MG/ML IV BOLUS
INTRAVENOUS | Status: DC | PRN
  Administered 2011-11-21: 30 mg via INTRAVENOUS
  Administered 2011-11-21: 100 mg via INTRAVENOUS

## 2011-11-21 MED ORDER — NEOSTIGMINE METHYLSULFATE 1 MG/ML IJ SOLN
INTRAMUSCULAR | Status: DC | PRN
  Administered 2011-11-21: 2 mg via INTRAVENOUS

## 2011-11-21 MED ORDER — LACTATED RINGERS IV SOLN
INTRAVENOUS | Status: DC
  Administered 2011-11-21: 13:00:00 via INTRAVENOUS
  Administered 2011-11-21: 1000 mL via INTRAVENOUS

## 2011-11-21 MED ORDER — LEVOTHYROXINE SODIUM 25 MCG PO TABS
25.0000 ug | ORAL_TABLET | Freq: Every day | ORAL | Status: DC
  Administered 2011-11-22 – 2011-11-23 (×2): 25 ug via ORAL
  Filled 2011-11-21 (×2): qty 1

## 2011-11-21 MED ORDER — ACETAMINOPHEN 10 MG/ML IV SOLN
INTRAVENOUS | Status: DC | PRN
  Administered 2011-11-21: 1000 mg via INTRAVENOUS

## 2011-11-21 MED ORDER — METOCLOPRAMIDE HCL 5 MG/ML IJ SOLN
INTRAMUSCULAR | Status: DC | PRN
  Administered 2011-11-21: 10 mg via INTRAVENOUS

## 2011-11-21 MED ORDER — OXYCODONE HCL 5 MG PO TABS
2.5000 mg | ORAL_TABLET | ORAL | Status: DC | PRN
  Filled 2011-11-21: qty 1

## 2011-11-21 MED ORDER — DIPHENHYDRAMINE HCL 50 MG/ML IJ SOLN
INTRAMUSCULAR | Status: DC | PRN
  Administered 2011-11-21: 12.5 mg via INTRAVENOUS

## 2011-11-21 MED ORDER — LACTATED RINGERS IV SOLN
INTRAVENOUS | Status: DC
  Administered 2011-11-21: 19:00:00 via INTRAVENOUS

## 2011-11-21 MED ORDER — ACETAMINOPHEN 325 MG PO TABS
650.0000 mg | ORAL_TABLET | Freq: Four times a day (QID) | ORAL | Status: DC
  Administered 2011-11-21 – 2011-11-23 (×7): 650 mg via ORAL
  Filled 2011-11-21 (×10): qty 2

## 2011-11-21 MED ORDER — HYDROMORPHONE HCL PF 1 MG/ML IJ SOLN
0.5000 mg | INTRAMUSCULAR | Status: DC | PRN
  Administered 2011-11-21 (×2): 0.5 mg via INTRAVENOUS

## 2011-11-21 MED ORDER — DEXAMETHASONE SODIUM PHOSPHATE 10 MG/ML IJ SOLN
INTRAMUSCULAR | Status: DC | PRN
  Administered 2011-11-21: 10 mg via INTRAVENOUS

## 2011-11-21 MED ORDER — IBUPROFEN 200 MG PO TABS
200.0000 mg | ORAL_TABLET | Freq: Every day | ORAL | Status: DC
  Administered 2011-11-21 – 2011-11-22 (×2): 200 mg via ORAL
  Filled 2011-11-21 (×3): qty 1

## 2011-11-21 MED ORDER — ONDANSETRON HCL 4 MG PO TABS: 4.0000 mg | ORAL_TABLET | Freq: Four times a day (QID) | ORAL | Status: DC | PRN

## 2011-11-21 MED ORDER — DIPHENHYDRAMINE HCL 50 MG/ML IJ SOLN: 12.5000 mg | Freq: Four times a day (QID) | INTRAMUSCULAR | Status: DC | PRN

## 2011-11-21 MED ORDER — TROLAMINE SALICYLATE 10 % EX CREA
TOPICAL_CREAM | Freq: Two times a day (BID) | CUTANEOUS | Status: DC | PRN
  Filled 2011-11-21: qty 85

## 2011-11-21 MED ORDER — TROLAMINE SALICYLATE 10 % EX LOTN: 1.0000 "application " | TOPICAL_LOTION | Freq: Two times a day (BID) | CUTANEOUS | Status: DC | PRN

## 2011-11-21 MED ORDER — FENTANYL BOLUS VIA INFUSION
20.0000 ug | INTRAVENOUS | Status: DC | PRN
  Filled 2011-11-21: qty 50

## 2011-11-21 MED ORDER — ALBUTEROL SULFATE (5 MG/ML) 0.5% IN NEBU: 2.5000 mg | INHALATION_SOLUTION | Freq: Four times a day (QID) | RESPIRATORY_TRACT | Status: DC | PRN

## 2011-11-21 MED ORDER — ROCURONIUM BROMIDE 100 MG/10ML IV SOLN
INTRAVENOUS | Status: DC | PRN
  Administered 2011-11-21: 10 mg via INTRAVENOUS
  Administered 2011-11-21: 30 mg via INTRAVENOUS

## 2011-11-21 MED ORDER — ONDANSETRON HCL 4 MG/2ML IJ SOLN: 4.0000 mg | Freq: Four times a day (QID) | INTRAMUSCULAR | Status: DC | PRN

## 2011-11-21 SURGICAL SUPPLY — 45 items
APPLIER CLIP 5 13 M/L LIGAMAX5 (MISCELLANEOUS)
BINDER ABD UNIV 12 45-62 (WOUND CARE) ×3 IMPLANT
BINDER ABDOMINAL 46IN 62IN (WOUND CARE) ×4
CANISTER SUCTION 2500CC (MISCELLANEOUS) ×4 IMPLANT
CLIP APPLIE 5 13 M/L LIGAMAX5 (MISCELLANEOUS) IMPLANT
CLOSURE STERI STRIP 1/2 X4 (GAUZE/BANDAGES/DRESSINGS) ×4 IMPLANT
CLOTH BEACON ORANGE TIMEOUT ST (SAFETY) ×4 IMPLANT
DECANTER SPIKE VIAL GLASS SM (MISCELLANEOUS) ×4 IMPLANT
DEVICE SECURE STRAP 25 ABSORB (INSTRUMENTS) ×8 IMPLANT
DEVICE TROCAR PUNCTURE CLOSURE (ENDOMECHANICALS) IMPLANT
DRAPE LAPAROSCOPIC ABDOMINAL (DRAPES) ×4 IMPLANT
DRSG TEGADERM 2-3/8X2-3/4 SM (GAUZE/BANDAGES/DRESSINGS) ×4 IMPLANT
ELECT REM PT RETURN 9FT ADLT (ELECTROSURGICAL) ×4
ELECTRODE REM PT RTRN 9FT ADLT (ELECTROSURGICAL) ×3 IMPLANT
FILTER SMOKE EVAC LAPAROSHD (FILTER) IMPLANT
GAUZE SPONGE 2X2 8PLY STRL LF (GAUZE/BANDAGES/DRESSINGS) ×3 IMPLANT
GLOVE ECLIPSE 8.0 STRL XLNG CF (GLOVE) ×4 IMPLANT
GLOVE INDICATOR 8.0 STRL GRN (GLOVE) ×8 IMPLANT
GOWN STRL NON-REIN LRG LVL3 (GOWN DISPOSABLE) ×4 IMPLANT
GOWN STRL REIN XL XLG (GOWN DISPOSABLE) ×8 IMPLANT
HAND ACTIVATED (MISCELLANEOUS) IMPLANT
KIT BASIN OR (CUSTOM PROCEDURE TRAY) ×4 IMPLANT
MESH PHYSIO OVAL 10X15CM (Mesh General) ×4 IMPLANT
MESH ULTRAPRO 6X6 15CM15CM (Mesh General) ×8 IMPLANT
NEEDLE SPNL 22GX3.5 QUINCKE BK (NEEDLE) IMPLANT
NS IRRIG 1000ML POUR BTL (IV SOLUTION) ×4 IMPLANT
PEN SKIN MARKING BROAD (MISCELLANEOUS) ×4 IMPLANT
PENCIL BUTTON HOLSTER BLD 10FT (ELECTRODE) IMPLANT
SCISSORS LAP 5X35 DISP (ENDOMECHANICALS) ×4 IMPLANT
SET IRRIG TUBING LAPAROSCOPIC (IRRIGATION / IRRIGATOR) IMPLANT
SLEEVE Z-THREAD 5X100MM (TROCAR) ×8 IMPLANT
SPONGE GAUZE 2X2 STER 10/PKG (GAUZE/BANDAGES/DRESSINGS) ×1
STRIP CLOSURE SKIN 1/2X4 (GAUZE/BANDAGES/DRESSINGS) IMPLANT
SUT MNCRL AB 4-0 PS2 18 (SUTURE) ×4 IMPLANT
SUT PROLENE 1 CT 1 30 (SUTURE) IMPLANT
SUT VIC AB 2-0 UR6 27 (SUTURE) IMPLANT
SUT VICRYL 0 UR6 27IN ABS (SUTURE) ×4 IMPLANT
TOWEL OR 17X26 10 PK STRL BLUE (TOWEL DISPOSABLE) ×4 IMPLANT
TRAY FOLEY CATH 14FRSI W/METER (CATHETERS) IMPLANT
TRAY LAP CHOLE (CUSTOM PROCEDURE TRAY) ×4 IMPLANT
TROCAR XCEL BLADELESS 5X75MML (TROCAR) ×4 IMPLANT
TROCAR XCEL BLUNT TIP 100MML (ENDOMECHANICALS) ×4 IMPLANT
TROCAR Z-THREAD FIOS 11X100 BL (TROCAR) ×4 IMPLANT
TROCAR Z-THREAD FIOS 5X100MM (TROCAR) ×4 IMPLANT
TUBING INSUFFLATION 10FT LAP (TUBING) ×4 IMPLANT

## 2011-11-21 NOTE — Anesthesia Postprocedure Evaluation (Signed)
  Anesthesia Post-op Note  Patient: Brianna [name]  Procedure(s) Performed:  LAPAROSCOPIC VENTRAL HERNIA - laparoscopic bilateral inguinal hernia repair with mesh and ventral hernia repair with mesh  Patient Location: PACU  Anesthesia Type: General  Level of Consciousness: awake and alert   Airway and Oxygen Therapy: Patient Spontanous Breathing  Post-op Pain: mild  Post-op Assessment: Post-op Vital signs reviewed, Patient's Cardiovascular Status Stable, Respiratory Function Stable, Patent Airway and No signs of Nausea or vomiting  Post-op Vital Signs: stable  Complications: No apparent anesthesia complications

## 2011-11-21 NOTE — Brief Op Note (Signed)
11/21/2011  3:35 PM  PATIENT:  Brianna [name]  78 y.o. female  Patient Care Team: Monica Becton, MD as PCP - General (Family Medicine)  PRE-OPERATIVE DIAGNOSIS:  ventral wall hernia periumbilical  POST-OPERATIVE DIAGNOSIS:    ventral wall hernia periumbilical VWH suprapubic BIH R obturator hernia incarcerated  PROCEDURE:  Procedure(s): LAPAROSCOPIC VENTRAL HERNIA repairs Lap BIH repair Lap R obturator hernia repair  SURGEON:  Surgeon(s): Ardeth Sportsman, MD  PHYSICIAN ASSISTANT:   ASSISTANTS: none   ANESTHESIA:   local and general  EBL:  Total I/O In: 1000 [I.V.:1000] Out: 200 [Urine:200]  BLOOD ADMINISTERED:none  DRAINS: none   LOCAL MEDICATIONS USED:  BUPIVICAINE 50CC  SPECIMEN:  No Specimen  DISPOSITION OF SPECIMEN:  N/A  COUNTS:  YES  TOURNIQUET:  * No tourniquets in log *  DICTATION: .Other Dictation: Dictation Number Dictated  PLAN OF CARE: Admit for overnight observation  PATIENT DISPOSITION:  PACU - hemodynamically stable.   Delay start of Pharmacological VTE agent (>24hrs) due to surgical blood loss or risk of bleeding:  {YES/NO/NOT APPLICABLE:20182

## 2011-11-21 NOTE — Progress Notes (Signed)
Dr. Leta Jungling will tell Dr. Michaell Cowing about patient going to Telemetry.

## 2011-11-21 NOTE — Progress Notes (Signed)
O.K. For patient to go to Telemetry per Dr. Leta Jungling

## 2011-11-21 NOTE — Preoperative (Signed)
Beta Blockers   Reason not to administer Beta Blockers:Not Applicable, pt not on home bb 

## 2011-11-21 NOTE — Anesthesia Procedure Notes (Signed)
Date/Time: 11/21/2011 1:04 PM Performed by: Randon Goldsmith CATHERINE PAYNE Pre-anesthesia Checklist: Patient identified, Emergency Drugs available, Suction available and Patient being monitored Patient Re-evaluated:Patient Re-evaluated prior to inductionOxygen Delivery Method: Circle System Utilized Preoxygenation: Pre-oxygenation with 100% oxygen Intubation Type: IV induction Laryngoscope Size: Mac and 3 Grade View: Grade I Tube type: Oral Tube size: 7.0 mm Number of attempts: 1 Placement Confirmation: ETT inserted through vocal cords under direct vision,  positive ETCO2,  CO2 detector and breath sounds checked- equal and bilateral Secured at: 21 cm Tube secured with: Tape Dental Injury: Teeth and Oropharynx as per pre-operative assessment

## 2011-11-21 NOTE — Progress Notes (Signed)
Foley catheter removed without difficulty- 125 cc of yellow-colored urine in bag. 

## 2011-11-21 NOTE — Transfer of Care (Signed)
Immediate Anesthesia Transfer of Care Note  Patient: Brianna [name]  Procedure(s) Performed:  LAPAROSCOPIC VENTRAL HERNIA - laparoscopic bilateral inguinal hernia repair with mesh and ventral hernia repair with mesh  Patient Location: PACU  Anesthesia Type: General  Level of Consciousness: sedated and patient cooperative  Airway & Oxygen Therapy: Patient Spontanous Breathing and Patient connected to face mask oxygen  Post-op Assessment: Report given to PACU RN, Post -op Vital signs reviewed and stable and Patient moving all extremities  Post vital signs: Reviewed and stable  Complications: No apparent anesthesia complications

## 2011-11-21 NOTE — Anesthesia Preprocedure Evaluation (Addendum)
Anesthesia Evaluation  Patient identified by MRN, date of birth, ID band Patient awake    Reviewed: Allergy & Precautions, H&P , NPO status , Patient's Chart, lab work & pertinent test results  History of Anesthesia Complications (+) PONV  Airway Mallampati: II TM Distance: >3 FB Neck ROM: full    Dental  (+) Caps,    Pulmonary COPD clear to auscultation  Pulmonary exam normal       Cardiovascular Exercise Tolerance: Good hypertension, On Medications neg cardio ROS + dysrhythmias regular Normal    Neuro/Psych Negative Neurological ROS  Negative Psych ROS   GI/Hepatic negative GI ROS, Neg liver ROS,   Endo/Other  Negative Endocrine ROSHypothyroidism   Renal/GU negative Renal ROS  Genitourinary negative   Musculoskeletal   Abdominal   Peds  Hematology negative hematology ROS (+)   Anesthesia Other Findings   Reproductive/Obstetrics negative OB ROS                          Anesthesia Physical Anesthesia Plan  ASA: III  Anesthesia Plan: General   Post-op Pain Management:    Induction: Intravenous  Airway Management Planned: Oral ETT  Additional Equipment:   Intra-op Plan:   Post-operative Plan: Extubation in OR  Informed Consent: I have reviewed the patients History and Physical, chart, labs and discussed the procedure including the risks, benefits and alternatives for the proposed anesthesia with the patient or authorized representative who has indicated his/her understanding and acceptance.   Dental Advisory Given  Plan Discussed with: CRNA and Surgeon  Anesthesia Plan Comments:         Anesthesia Quick Evaluation

## 2011-11-21 NOTE — H&P (Signed)
Brianna [name]  February 20, 1925  [id number]  Patient Care Team:  Monica Becton, MD as PCP - General [facility] Medicine)   Reason for visit: Bulging around bellybutton. A probable hernia.   Her primary care physician was concerned about a hernia. Patient does note it is sensitive to touch and uncomfortable with pressure on it. No episodes of severe pain. No nausea or vomiting.  She had been open low anterior resection for ealy Stage 1 cancer within a polyp in 2004. No fevers chills or sweats. She cannot walk to 4 due to her arthritis. However, she does do home exercise as such it leg lifts etc. a couple times a day. No history of heart attack strokes. Rather active.   No episodes of incarceration/strangulation.  No N/V   Patient Active Problem List  Diagnoses  . Incisional hernia  . Arthritis    Past Medical History  Diagnosis Date  . Other and unspecified hyperlipidemia   . Lumbosacral spondylosis without myelopathy   . Fibrocystic breast   . Osteoporosis   . Symptomatic menopausal or female climacteric states   . Colon polyp   . Hypertension   . 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 PONV (postoperative nausea and vomiting) 11-15-11    after anesthesia  . Dysrhythmia 11-15-11    was told has a slight irregular beat by PCP.  Marland Kitchen COPD (chronic obstructive pulmonary disease) 11-15-11    pt. not aware. &'04 CXR -abnormal findings  . Hypothyroidism 11-15-11    tx. meds.-hypo  . Ventral hernia 11-15-11    surgery is planned    Past Surgical History  Procedure Date  . Cesarean section   . Tonsillectomy   . Appendectomy   . Cataract extraction w/ intraocular lens  implant, bilateral   . Colon surgery 2004    8'04-open LAR for rectosigmoid cancer T1N0 within polyp    History   Social History  . Marital Status: Widowed    Spouse Name: N/A    Number of Children: N/A  . Years of Education: N/A   Occupational History  . Not on file.   Social History Main Topics  . Smoking  status: Never Smoker   . Smokeless tobacco: Not on file  . Alcohol Use: No  . Drug Use: No  . Sexually Active: No   Other Topics Concern  . Not on file   Social History Narrative  . No narrative on file    Family History  Problem Relation Age of Onset  . Heart disease Mother   . Heart disease Father   . Cancer Brother     lung    Current facility-administered medications:ceFAZolin (ANCEF) IVPB 2 g/50 mL premix, 2 g, Intravenous, 60 min Pre-Op, Ardeth Sportsman, MD;  lactated ringers infusion, , Intravenous, Continuous, Gaetano Hawthorne, MD, Last Rate: 100 mL/hr at 11/21/11 0927, 1,000 mL at 11/21/11 4098  Allergies  Allergen Reactions  . Actonel   . Fosamax Nausea Only  . Lodine (Etodolac)     BP 113/75  Pulse 94  Temp(Src) 97.7 F (36.5 C) (Oral)  Resp 18  SpO2 100%     Review of Systems  Constitutional: Negative for fever, chills, diaphoresis, appetite change and fatigue.  HENT: Negative for ear pain, sore throat, trouble swallowing, neck pain and ear discharge.  Eyes: Negative for photophobia, discharge and visual disturbance.  Respiratory: Negative for cough, choking, chest tightness and shortness of breath.  Cardiovascular: Negative for chest pain and palpitations.  Gastrointestinal: Negative for nausea, vomiting, diarrhea, constipation, blood in stool, abdominal distention, anal bleeding and rectal pain.  Mild sensitivity/disconfort at abd bulge/hernia  Genitourinary: Negative for dysuria, frequency and difficulty urinating.  Musculoskeletal: Positive for arthralgias. Negative for myalgias and gait problem.  Skin: Negative for color change, pallor and rash.  Neurological: Negative for dizziness, speech difficulty, weakness and numbness.  Hematological: Negative for adenopathy.  Psychiatric/Behavioral: Negative for confusion and agitation. The patient is not nervous/anxious.  Objective:   Physical Exam  Constitutional: She is oriented to person, place, and  time. She appears well-developed and well-nourished. No distress.  HENT:  Head: Normocephalic.  Mouth/Throat: Oropharynx is clear and moist. No oropharyngeal exudate.  Eyes: Conjunctivae and EOM are normal. Pupils are equal, round, and reactive to light. No scleral icterus.  Neck: Normal range of motion. Neck supple. No tracheal deviation present.  Cardiovascular: Normal rate, regular rhythm and intact distal pulses.  Pulmonary/Chest: Effort normal and breath sounds normal. No respiratory distress. She exhibits no tenderness.  Abdominal: Soft. She exhibits no distension. There is no tenderness. There is no rebound and no guarding. Hernia confirmed negative in the right inguinal area and confirmed negative in the left inguinal area.  Low midline incision  Periumbilcal 6x6cm mass reduces to 2x3cm VWH - reducible Genitourinary: No vaginal discharge found.  Musculoskeletal: Normal range of motion. She exhibits no tenderness.  Lymphadenopathy:  She has no cervical adenopathy.  Right: No inguinal adenopathy present.  Left: No inguinal adenopathy present.  Neurological: She is alert and oriented to person, place, and time. No cranial nerve deficit. She exhibits normal muscle tone. Coordination normal.  Skin: Skin is warm and dry. No rash noted. She is not diaphoretic. No erythema.  Psychiatric: She has a normal mood and affect. Her behavior is normal. Judgment and thought content normal.  Assessment:   VWH incisional in elderly but active female  Plan:   Given the fact that I strongly suspect a loop of small bowel is up in this hernia, and she is sensitive at the area; I think she would benefit from surgical repair. I think she'll be a good candidate for a laparoscopic approach with an underlay mesh. She may be able to leave that day. However, given her advanced age and likelihood of a moderate-sized mesh to fix this, I still would plan for an overnight stay at the least. She has a daughter for help  postoperatively.   The anatomy & physiology of the abdominal wall was discussed. The pathophysiology of hernias was discussed. Natural history risks without surgery of enlargement, pain, incarceration & strangulation was discussed. Contributors to complications such as smoking, obesity, diabetes, prior surgery, etc were discussed. I feel the risks of no intervention will lead to serious problems that outweigh the operative risks; therefore, I recommended surgery to reduce and repair the hernia. I explained laparoscopic techniques with possible need for an open approach. I noted the probable use of mesh to patch and/or buttress hernia repair   Risks such as bleeding, infection, abscess, need for further treatment, heart attack, death, and other risks were discussed. I noted a good likelihood this will help address the problem. Goals of post-operative recovery were discussed as well. Possibility that this will not correct all symptoms was explained. I stressed the importance of low-impact activity, aggressive pain control, avoiding constipation, & not pushing through pain to minimize risk of post-operative chronic pain or injury. Possibility of reherniation was discussed. We will work to minimize complications. An educational handout  further explaining the pathology & treatment options was given as well. Questions were answered. The patient expresses understanding & wishes to proceed with surgery.   I have re-reviewed the the patient's records, history, medications, and allergies.  I have re-examined the patient.  I again discussed intraoperative plans and goals of post-operative recovery.  The patient agrees to proceed.

## 2011-11-21 NOTE — Progress Notes (Signed)
Dr. Leta Jungling in- made aware of patient's EKG readings on monitor in PACU- shown pre-op EKG copy on chart and made aware of readings in the O.R.

## 2011-11-22 MED ORDER — SODIUM CHLORIDE 0.9 % IJ SOLN: 3.0000 mL | INTRAMUSCULAR | Status: DC | PRN

## 2011-11-22 MED ORDER — LACTATED RINGERS IV BOLUS (SEPSIS): 1000.0000 mL | Freq: Three times a day (TID) | INTRAVENOUS | Status: DC | PRN

## 2011-11-22 MED ORDER — OXYCODONE HCL 5 MG PO TABS
2.5000 mg | ORAL_TABLET | Freq: Four times a day (QID) | ORAL | Status: DC | PRN
  Administered 2011-11-22 (×2): 5 mg via ORAL
  Administered 2011-11-22: 2.5 mg via ORAL
  Administered 2011-11-23 (×2): 5 mg via ORAL
  Filled 2011-11-22 (×4): qty 1

## 2011-11-22 MED ORDER — OXYCODONE HCL 5 MG PO TABS: 2.5000 mg | ORAL_TABLET | Freq: Four times a day (QID) | ORAL | Status: AC | PRN

## 2011-11-22 MED ORDER — SODIUM CHLORIDE 0.9 % IJ SOLN
3.0000 mL | Freq: Two times a day (BID) | INTRAMUSCULAR | Status: DC
  Administered 2011-11-22 (×2): 3 mL via INTRAVENOUS

## 2011-11-22 NOTE — Op Note (Signed)
Brianna [name], Brianna [name] NO.:  [id number]  MEDICAL RECORD NO.:  [id number]  LOCATION:  1432                         FACILITY:  [facility]  PHYSICIAN:  Ardeth Sportsman, MD     DATE OF BIRTH:  [birth year]  DATE OF PROCEDURE: DATE OF DISCHARGE:                              OPERATIVE REPORT   PRIMARY CARE PHYSICIAN:  Ernestina Penna, M.D.  SURGEON:  Ardeth Sportsman, MD  ASSISTANT:  RN.  PREOPERATIVE DIAGNOSIS:  Ventral incisional hernia, periumbilical.  POSTOPERATIVE DIAGNOSES: 1. Ventral incisional hernia, periumbilical. 2. Ventral incisional hernia, suprapubic 3. Right greater than left bilateral inguinal herniae. 4. Right obturator hernia, incarcerated.  PROCEDURE PERFORMED: 1. Diagnostic laparoscopy with lysis of adhesions. 2. Laparoscopic bilateral inguinal and right obturator hernia repairs     with preperitoneal mesh (TAPP). 3. Laparoscopic periumbilical ventral hernia repair with mesh.  ANESTHESIA: 1. General anesthesia. 2. Local anesthetic in a field block on all port sites.  SPECIMEN:  None.  DRAINS:  None.  ESTIMATED BLOOD LOSS:  Minimal.  COMPLICATIONS:  None apparent.  INDICATIONS:  Brianna [name] is an 78 year old active female who has had prior abdominal surgeries including an open low anterior resection and cesarean section.  She has had some periumbilical swelling and pain. She had a CAT scan showing small bowel within a ventral hernia.  Because this has increased in size and she has had some abdominal discomfort, I recommended considering diagnostic laparoscopy with repair.  Pathophysiology of incarceration, strangulation, esp with small bowel within this was discussed.  Risks, benefits, alternatives discussed. Questions answered and she agreed to proceed.  OPERATIVE FINDINGS: 1. She had a 3.5 x 2 cm swiss cheese region periumbilical ventral     incisional hernias. 2. She had a 3 x 2 cm suprapubic ventral hernia just at the pubic rim. 3.  She had a right greater than left indirect inguinal herniae. 4. She had a small, but incarcerated obturator hernia.  DESCRIPTION OF PROCEDURE:  Informed consent was confirmed.  The patient underwent general anesthesia without any difficulty.  She was positioned supine with arms tucked.  She received IV antibiotics.  Her abdomen was prepped and draped in sterile fashion.  Surgical time-out confirmed our plan.  I placed a #5 mm port in the right upper quadrant using optical entry technique with the patient in steep Trendelenburg and right side up.  On entry, I did have a small scratch on the surface of the liver.  I placed a 5 mm port in the right mid-abdomen after doing carbon dioxide insufflation.  We used spatula-tipped cautery to ensure hemostasis.  It was a very superficial scratch.  I placed another 5 mm port in the right upper quadrant and another 5 mm port in the left upper quadrant.  I did diagnostic laparoscopy and did lysis of adhesions to free some omental attachments to the anterior abdominal wall.  The adhesions to the anterior abdominal wall were rather small.  I found the periumbilical ventral hernia.  On inspection, I could see an obvious hernia down in the suprapubic and bilateral inguinal regions as well. They were of moderate size and there had been some colon nearby on  the suprapubic hernia.  Based on concerns, I felt that these needed to be repaired as well.  I initially tried to do a TEP procedure by trying to place a 10 mm port in the preperitoneal plane to the left of midline, but she already had a diastasis with scarring, and I could not get a good plane into the preperitoneal plane that way.    Therefore, I decided to do a TAPP approach.  I scored the peritoneum transversely and just a little inferior to the umbilicus starting in the right anterior axillary line and going to the left anterior axillary line.  I got into the preperitoneal avascular plane and  freed that off.  She had some dense adhesions to the midline, but I freed that off.  I found the hernia sac and freed that down in the suprapubic hernia.  I freed off the peritoneum off bilateral round ligaments and transected the round ligaments since she had already had a hysterectomy and oophorectomy.  I peeled the peritoneum off bilateral psoas.  I also freed the anterior medial bladder wall off the right and left side to get in a classic preperitoneal hernia fashion.  I used 15 x 15 cm ultra-lightweight polypropylene mesh (UltraPro mesh) for each side.  I cut a slit such that a 6 x 6 cm flap rest in the true pelvis between the bladder and the pelvic sidewall.  Of note, when I was freeing the right anterior bladder wall, I did see an obvious obturator hernia incarcerated which I freed down.  No femoral hernias were seen. No direct space hernias were seen.  I did lay those meshes as overlapping diamond with the medial flaps overlapping across the midline.  This allowed excellent coverage of both internal rings where the indirect inguinal hernias were and as well as good overlap of the suprapubic region.  I held the meshes in place with an absorbable SecureStrap tacker.  I then re-tacked the peritoneum to the anterior abdominal wall, covering up all defects.  I then turned attention toward the periumbilical ventral hernia.  I used a 10 x 15 cm Physiomesh (ultra-lightweight polypropylene/Monocryl covering).  I secured it to the anterior abdominal wall using #1 Prolene interrupted stitches x10.  This provided at least 2 inches circumferential coverage about the ventral hernia.  I closed the ventral hernia with some interrupted 0 Vicryl stitches on top of that as well. I closed the skin.  I did camera inspection, hemostasis was excellent.  I removed the 5 mm ports and closed the skin with 4-0 Monocryl stitch.  I closed the puncture sites of the transabdominal fascial stitches with  Steri-Strips.  She was extubated to go to recovery room.  I anticipate her needing to stay at least overnight.  I discussed the patient's status to the family.  Questions were answered.  They expressed understanding & appreciation.     Ardeth Sportsman, MD     SCG/MEDQ  D:  11/21/2011  T:  11/22/2011  Job:  [id number]

## 2011-11-22 NOTE — Progress Notes (Signed)
Brianna [name] 1925/05/02 [id number]  PCP: Monica Becton, MD, MD Outpatient Care Team: Patient Care Team: Monica Becton, MD as PCP - General (Family Medicine)  Inpatient Treatment Team: Treatment Team: Attending Provider: Ardeth Sportsman, MD; Physician Assistant: Luberta Robertson, NT; Respiratory Therapist: Carney Harder, RRT; Registered Nurse: Buel Ream, RN; Technician: Merlene Laughter, NT  Subjective:  Sore at bellybutton No nausea Daughter & RN in room Walking well in hallways Voiding ## Afraid to try narcotics PO - thinks she had a reaction to one but cannot recall what it was  Objective:  Vital signs:  Temp:  [97.6 F (36.4 C)-98.7 F (37.1 C)] 97.9 F (36.6 C) (01/23 0453) Pulse Rate:  [59-107] 84  (01/23 0453) Resp:  [11-25] 17  (01/23 0453) BP: (113-180)/(51-76) 180/76 mmHg (01/23 0505) SpO2:  [93 %-100 %] 93 % (01/23 0453) Weight:  [124 lb (56.246 kg)] 124 lb (56.246 kg) (01/22 1759) Last BM Date: 11/21/11  Intake/Output   Yesterday:  01/22 0701 - 01/23 0700 In: 1914.7 [I.V.:2671.3] Out: 1125 [Urine:1075; Blood:50] This shift:  Total I/O In: 871.3 [I.V.:871.3] Out: 750 [Urine:750]  Bowel function:  Flatus: Yes   BM: Small  Physical Exam:  General: Pt awake/alert/oriented x4 in no acute distress Eyes: PERRL, normal EOM.  Sclera clear.  No icterus Neuro: CN II-XII intact w/o focal sensory/motor deficits. Lymph: No head/neck/groin lymphadenopathy Psych:  No delerium/psychosis/paranoia HENT: Normocephalic, Mucus membranes moist.  No thrush Neck: Supple, No tracheal deviation Chest: Clear.  No chest wall pain w good excursion CV:  Pulses intact.  Regular rhythm Abdomen: Soft, Nontender/Nondistended.  No incarcerated hernias.  Dressings c/d/i.  Soreness around umb repair Ext:  SCDs BLE.  No mjr edema.  No cyanosis Skin: No petechiae / purpurae  Results:   Labs: Results for orders placed during the hospital encounter of  11/21/11 (from the past 48 hour(s))  CBC     Status: Abnormal   Collection Time   11/21/11  7:13 PM      Component Value Range Comment   WBC 11.0 (*) 4.0 - 10.5 (K/uL)    RBC 3.72 (*) 3.87 - 5.11 (MIL/uL)    Hemoglobin 10.8 (*) 12.0 - 15.0 (g/dL)    HCT 82.9 (*) 56.2 - 46.0 (%)    MCV 91.7  78.0 - 100.0 (fL)    MCH 29.0  26.0 - 34.0 (pg)    MCHC 31.7  30.0 - 36.0 (g/dL)    RDW 13.0  86.5 - 78.4 (%)    Platelets 198  150 - 400 (K/uL)   CREATININE, SERUM     Status: Abnormal   Collection Time   11/21/11  7:13 PM      Component Value Range Comment   Creatinine, Ser 0.76  0.50 - 1.10 (mg/dL)    GFR calc non Af Amer 75 (*) >90 (mL/min)    GFR calc Af Amer 87 (*) >90 (mL/min)     Imaging / Studies: No results found.  Medications / Allergies: per chart  Antibiotics: Anti-infectives     Start     Dose/Rate Route Frequency Ordered Stop   11/21/11 0815   ceFAZolin (ANCEF) IVPB 2 g/50 mL premix        2 g 100 mL/hr over 30 Minutes Intravenous 60 min pre-op 11/21/11 6962 11/21/11 1308          Assessment  Brianna [name]  78 y.o. female  1 Day Post-Op  Procedure(s): LAPAROSCOPIC VENTRAL HERNIA & BIH &  R obturator hernia repairs w mesh  Problem List:  Principal Problem:  *Incisional hernia Active Problems:  Bilateral inguinal hernia (BIH)  Sore but recovering  Plan: -try PO oxycodone low-dose - change if having problems -adv diet -bowel regimen -VTE prophylaxis- SCDs, etc -mobilize as tolerated to help recovery -D/C later today vs tomorrow if meets goals, esp if adequate PO pain control   Ardeth Sportsman, M.D., F.A.C.S. Gastrointestinal and Minimally Invasive Surgery [facility] [address] [place], [place] [address] [phone] Main / Paging [phone] Voice Mail   11/22/2011

## 2011-11-22 NOTE — Plan of Care (Signed)
Problem: Phase I Progression Outcomes Goal: OOB as tolerated unless otherwise ordered Outcome: Completed/Met Date Met:  11/22/11 Pt ambulated in halls >100 ft    Goal: Initial discharge plan identified Outcome: Completed/Met Date Met:  11/22/11 Pt plans to d/c Home

## 2011-11-23 NOTE — Discharge Summary (Signed)
Physician Discharge Summary  Patient ID: Brianna [name] MRN: [id number] DOB/AGE: Sep 06, 1925 78 y.o.  Admit date: 11/21/2011 Discharge date: 11/23/2011  Admission Diagnoses: Principal Problem:  *Incisional hernia  Discharge Diagnoses:  Principal Problem:  *Incisional hernia Active Problems:  Bilateral inguinal hernia (BIH)   Discharged Condition: pretty good  Hospital Course: Pt was found to have 5 hernias instead of 1 by laparoscopy.  She underwent lap repair.  She was placed on telemetry for anaesthesia's wishes.  She had mil;d PAC/PVCs but no major events.  She initially refused narcotics PO but then started using some.  By the time of D/C, she was tolerating solids PO well, walking well in hallways, urinating fine, and had adequate pain control w PO oxycodone low dose, tylenol, ice, and abd binder.  Therefore, after many discussions, reassurances, and answering questions; I felt it was safe to D/C home w close followup.  Her daughter & RN agreed  Consults: None  Significant Diagnostic Studies:   Treatments: Lap VWH x2, BIH, & R obturator hernia repairs w mesh 11/21/2011  Discharge Exam: Blood pressure 167/82, pulse 90, temperature 98.1 F (36.7 C), temperature source Oral, resp. rate 18, height 5\' 2"  (1.575 m), weight 124 lb (56.246 kg), SpO2 94.00%.  General: Pt awake/alert/oriented x4 in no major acute distress Eyes: PERRL, normal EOM. Neuro: CN II-XII intact w/o focal sensory/motor deficits. Lymph: No head/neck/groin lymphadenopathy Psych:  No delerium/psychosis/paranoia.  Mildly anxious but consolable HEENT: Normocephalic, Mucus membranes moist.  No thrush Neck: Supple, No tracheal deviation Chest: No pain w good excursion CV:  Pulses intact.  Regular rhythm Abdomen: soft, Mildly tender infraumb.  Nondistended.  No incarcerated hernias. Ext:  SCDs BLE.  No mjr edema.  No cyanosis Skin: No petechiae / purpurae   Disposition: Home or Self Care  Discharge Orders      Future Orders Please Complete By Expires   Diet - low sodium heart healthy      Diet - low sodium heart healthy      Diet - low sodium heart healthy      Increase activity slowly      Discharge instructions      Comments:   See [facility] HERNIA sheet   Increase activity slowly      Increase activity slowly        Medication List  As of 11/23/2011 12:52 PM   TAKE these medications         acetaminophen 500 MG tablet   Commonly known as: TYLENOL   Take 500 mg by mouth every 6 (six) hours as needed. Pain        ADVIL PM 200-25 MG Caps   Generic drug: Ibuprofen-Diphenhydramine HCl   Take 1 capsule by mouth at bedtime.      ASPERCREME 10 % Lotn   Generic drug: Trolamine Salicylate   Apply 1 application topically 2 (two) times daily as needed. Pain        aspirin 81 MG EC tablet   Take 81 mg by mouth 3 (three) times a week. Monday, Wednesday, and Friday  .      atorvastatin 10 MG tablet   Commonly known as: LIPITOR   Take 5 mg by mouth at bedtime.      beta carotene w/minerals tablet   Take 1 tablet by mouth daily.      calcium citrate-vitamin D 315-200 MG-UNIT per tablet   Commonly known as: CITRACAL+D   Take 2 tablets by mouth daily.  levothyroxine 25 MCG tablet   Commonly known as: SYNTHROID, LEVOTHROID   Take 25 mcg by mouth daily before breakfast.      meloxicam 15 MG tablet   Commonly known as: MOBIC   Take 15 mg by mouth daily as needed. Pain        oxyCODONE 5 MG immediate release tablet   Commonly known as: Oxy IR/ROXICODONE   Take 0.5-2 tablets (2.5-10 mg total) by mouth every 6 (six) hours as needed.      valsartan 160 MG tablet   Commonly known as: DIOVAN   Take 160 mg by mouth daily after breakfast.      Vitamin D3 2000 UNITS Tabs   Take 1-2 tablets by mouth daily. 1 tab every day except 2 tabs on sat and sun           Follow-up Information    Follow up with Nikolas Casher C., MD in 2 weeks.   Contact information:   [facility] [address] [place] [address] [phone]          Signed: Ardeth Sportsman. 11/23/2011, 12:52 PM

## 2011-11-23 NOTE — Progress Notes (Signed)
Brianna [name] September 02, 1925 [id number]  PCP: Monica Becton, MD, MD Outpatient Care Team: Patient Care Team: Monica Becton, MD as PCP - General (Family Medicine)  Inpatient Treatment Team: Treatment Team: Attending Provider: Ardeth Sportsman, MD; Registered Nurse: Buel Ream, RN; Physician Assistant: Luberta Robertson, NT; Technician: Rayfield Citizen, NT; Registered Nurse: Sheran Luz, RN; Technician: Esmond Camper Kebu, NT  Subjective:  Very sore at infraumb region when standing up.  Denies pain lying in bed.  Using ice / tylenol / low dose oxycodone. No nausea Daughter in room Walking well in hallways Voiding well Was afraid to go home yesterday, less so today  Objective:  Vital signs:  Temp:  [97.9 F (36.6 C)-98.8 F (37.1 C)] 98.1 F (36.7 C) (01/24 0611) Pulse Rate:  [82-90] 90  (01/24 0611) Resp:  [18] 18  (01/24 0611) BP: (153-167)/(68-82) 167/82 mmHg (01/24 0611) SpO2:  [94 %-95 %] 94 % (01/24 0611) Last BM Date: 11/21/11  Intake/Output   Yesterday:  01/23 0701 - 01/24 0700 In: 360 [P.O.:360] Out: -  This shift:     Bowel function:  Flatus: Yes   BM: Small  Physical Exam:  General: Pt awake/alert/oriented x4 in no acute distress Eyes: PERRL, normal EOM.  Sclera clear.  No icterus Neuro: CN II-XII intact w/o focal sensory/motor deficits. Lymph: No head/neck/groin lymphadenopathy Psych:  No delerium/psychosis/paranoia HENT: Normocephalic, Mucus membranes moist.  No thrush Neck: Supple, No tracheal deviation Chest: Clear.  No chest wall pain w good excursion CV:  Pulses intact.  Regular rhythm Abdomen: Soft, Nontender/Nondistended.  No incarcerated hernias.  Dressings c/d/i.  Soreness inferior abdomen.  No peritonitis Ext:  SCDs BLE.  No mjr edema.  No cyanosis Skin: No petechiae / purpurae  Results:   Labs: Results for orders placed during the hospital encounter of 11/21/11 (from the past 48 hour(s))  CBC     Status:  Abnormal   Collection Time   11/21/11  7:13 PM      Component Value Range Comment   WBC 11.0 (*) 4.0 - 10.5 (K/uL)    RBC 3.72 (*) 3.87 - 5.11 (MIL/uL)    Hemoglobin 10.8 (*) 12.0 - 15.0 (g/dL)    HCT 40.9 (*) 81.1 - 46.0 (%)    MCV 91.7  78.0 - 100.0 (fL)    MCH 29.0  26.0 - 34.0 (pg)    MCHC 31.7  30.0 - 36.0 (g/dL)    RDW 91.4  78.2 - 95.6 (%)    Platelets 198  150 - 400 (K/uL)   CREATININE, SERUM     Status: Abnormal   Collection Time   11/21/11  7:13 PM      Component Value Range Comment   Creatinine, Ser 0.76  0.50 - 1.10 (mg/dL)    GFR calc non Af Amer 75 (*) >90 (mL/min)    GFR calc Af Amer 87 (*) >90 (mL/min)     Imaging / Studies: No results found.  Medications / Allergies: per chart  Antibiotics: Anti-infectives     Start     Dose/Rate Route Frequency Ordered Stop   11/21/11 0815   ceFAZolin (ANCEF) IVPB 2 g/50 mL premix        2 g 100 mL/hr over 30 Minutes Intravenous 60 min pre-op 11/21/11 2130 11/21/11 1308          Assessment  Brianna [name]  78 y.o. female  2 Days Post-Op  Procedure(s): LAPAROSCOPIC VENTRAL HERNIA & BIH & R obturator  hernia repairs w mesh  Problem List:  Principal Problem:  *Incisional hernia Active Problems:  Bilateral inguinal hernia (BIH)  Sore but recovering  Plan: -bowel regimen -VTE prophylaxis- SCDs, etc -mobilize as tolerated to help recovery -D/C home - I had a long d/w pt/daughter about goals of recovery & using pain regimen.  ?s answered.  They expressed understanding & appreciation   Ardeth Sportsman, M.D., F.A.C.S. Gastrointestinal and Minimally Invasive Surgery [facility] [address], [address] [place], [place] [address] [phone] Main / Paging [phone] Voice Mail   11/23/2011

## 2011-11-24 ENCOUNTER — Telehealth (INDEPENDENT_AMBULATORY_CARE_PROVIDER_SITE_OTHER): Payer: Self-pay

## 2011-11-24 DIAGNOSIS — Z8719 Personal history of other diseases of the digestive system: Secondary | ICD-10-CM

## 2011-11-24 MED ORDER — PROMETHAZINE HCL 12.5 MG PO TABS: 12.5000 mg | ORAL_TABLET | Freq: Four times a day (QID) | ORAL | Status: AC | PRN

## 2011-11-24 NOTE — Telephone Encounter (Signed)
Calling in this am b/c pt having nausea after taking Oxycodone and requested something be phoned in for nausea. Called Dr Michaell Cowing to ask him about calling in a Rx. Phenergan 12.5 #12 take 1/2-1 tab q 6hrs prn n&v phoned to [facility] [phone] per Dr Michaell Cowing.

## 2011-12-12 ENCOUNTER — Encounter (INDEPENDENT_AMBULATORY_CARE_PROVIDER_SITE_OTHER): Payer: Medicare Other | Admitting: Surgery

## 2011-12-19 ENCOUNTER — Encounter (INDEPENDENT_AMBULATORY_CARE_PROVIDER_SITE_OTHER): Payer: Medicare Other | Admitting: Surgery

## 2011-12-19 ENCOUNTER — Telehealth (INDEPENDENT_AMBULATORY_CARE_PROVIDER_SITE_OTHER): Payer: Self-pay

## 2011-12-19 ENCOUNTER — Encounter (HOSPITAL_COMMUNITY): Payer: Self-pay | Admitting: Surgery

## 2011-12-19 NOTE — Telephone Encounter (Signed)
Spoke to pt's daughter Darletta Moll and we r/s pt's appt for next week 12-25-11 with Dr Michaell Cowing.

## 2011-12-20 ENCOUNTER — Encounter (INDEPENDENT_AMBULATORY_CARE_PROVIDER_SITE_OTHER): Payer: Medicare Other | Admitting: Surgery

## 2012-01-08 ENCOUNTER — Encounter (INDEPENDENT_AMBULATORY_CARE_PROVIDER_SITE_OTHER): Payer: Self-pay | Admitting: Surgery

## 2012-01-08 ENCOUNTER — Ambulatory Visit (INDEPENDENT_AMBULATORY_CARE_PROVIDER_SITE_OTHER): Payer: Medicare Other | Admitting: Surgery

## 2012-01-08 ENCOUNTER — Telehealth (INDEPENDENT_AMBULATORY_CARE_PROVIDER_SITE_OTHER): Payer: Self-pay

## 2012-01-08 DIAGNOSIS — K458 Other specified abdominal hernia without obstruction or gangrene: Secondary | ICD-10-CM

## 2012-01-08 DIAGNOSIS — K4020 Bilateral inguinal hernia, without obstruction or gangrene, not specified as recurrent: Secondary | ICD-10-CM

## 2012-01-08 DIAGNOSIS — K432 Incisional hernia without obstruction or gangrene: Secondary | ICD-10-CM

## 2012-01-08 NOTE — Telephone Encounter (Signed)
N/A. I was calling to see if pt was on their way to their appt today with DR Gross.

## 2012-01-08 NOTE — Patient Instructions (Signed)
HERNIA REPAIR: POST OP INSTRUCTIONS  1. DIET: Follow a light bland diet the first 24 hours after arrival home, such as soup, liquids, crackers, etc.  Be sure to include lots of fluids daily.  Avoid fast food or heavy meals as your are more likely to get nauseated.  Eat a low fat the next few days after surgery. 2. Take your usually prescribed home medications unless otherwise directed. 3. PAIN CONTROL: a. Pain is best controlled by a usual combination of three different methods TOGETHER: i. Ice/Heat ii. Over the counter pain medication iii. Prescription pain medication b. Most patients will experience some swelling and bruising around the hernia(s) such as the bellybutton, groins, or old incisions.  Ice packs or heating pads (30-60 minutes up to 6 times a day) will help. Use ice for the first few days to help decrease swelling and bruising, then switch to heat to help relax tight/sore spots and speed recovery.  Some people prefer to use ice alone, heat alone, alternating between ice & heat.  Experiment to what works for you.  Swelling and bruising can take several weeks to resolve.   c. It is helpful to take an over-the-counter pain medication regularly for the first few weeks.  Choose one of the following that works best for you: i. Naproxen (Aleve, etc)  Two 220mg tabs twice a day ii. Ibuprofen (Advil, etc) Three 200mg tabs four times a day (every meal & bedtime) iii. Acetaminophen (Tylenol, etc) 325-650mg four times a day (every meal & bedtime) d. A  prescription for pain medication should be given to you upon discharge.  Take your pain medication as prescribed.  i. If you are having problems/concerns with the prescription medicine (does not control pain, nausea, vomiting, rash, itching, etc), please call us [phone] to see if we need to switch you to a different pain medicine that will work better for you and/or control your side effect better. ii. If you need a refill on your pain  medication, please contact your pharmacy.  They will contact our office to request authorization. Prescriptions will not be filled after 5 pm or on week-ends. 4. Avoid getting constipated.  Between the surgery and the pain medications, it is common to experience some constipation.  Increasing fluid intake and taking a fiber supplement (such as Metamucil, Citrucel, FiberCon, MiraLax, etc) 1-2 times a day regularly will usually help prevent this problem from occurring.  A mild laxative (prune juice, Milk of Magnesia, MiraLax, etc) should be taken according to package directions if there are no bowel movements after 48 hours.   5. Wash / shower every day.  You may shower over the dressings as they are waterproof.   6. Remove your waterproof bandages 5 days after surgery.  You may leave the incision open to air.  You may replace a dressing/Band-Aid to cover the incision for comfort if you wish.  Continue to shower over incision(s) after the dressing is off.    7. ACTIVITIES as tolerated:   a. You may resume regular (light) daily activities beginning the next day-such as daily self-care, walking, climbing stairs-gradually increasing activities as tolerated.  If you can walk 30 minutes without difficulty, it is safe to try more intense activity such as jogging, treadmill, bicycling, low-impact aerobics, swimming, etc. b. Save the most intensive and strenuous activity for last such as sit-ups, heavy lifting, contact sports, etc  Refrain from any heavy lifting or straining until you are off narcotics for pain control.     c. DO NOT PUSH THROUGH PAIN.  Let pain be your guide: If it hurts to do something, don't do it.  Pain is your body warning you to avoid that activity for another week until the pain goes down. d. You may drive when you are no longer taking prescription pain medication, you can comfortably wear a seatbelt, and you can safely maneuver your car and apply brakes. e. You may have sexual intercourse  when it is comfortable.  8. FOLLOW UP in our office a. Please call [facility] at [phone] to set up an appointment to see your surgeon in the office for a follow-up appointment approximately 2-3 weeks after your surgery. b. Make sure that you call for this appointment the day you arrive home to insure a convenient appointment time. 9.  IF YOU HAVE DISABILITY OR FAMILY LEAVE FORMS, BRING THEM TO THE OFFICE FOR PROCESSING.  DO NOT GIVE THEM TO YOUR DOCTOR.  WHEN TO CALL US [phone]: 1. Poor pain control 2. Reactions / problems with new medications (rash/itching, nausea, etc)  3. Fever over 101.5 F (38.5 C) 4. Inability to urinate 5. Nausea and/or vomiting 6. Worsening swelling or bruising 7. Continued bleeding from incision. 8. Increased pain, redness, or drainage from the incision   The clinic staff is available to answer your questions during regular business hours (8:30am-5pm).  Please don't hesitate to call and ask to speak to one of our nurses for clinical concerns.   If you have a medical emergency, go to the nearest emergency room or call 911.  A surgeon from [facility] is always on call at the hospitals in [place]  [facility] [address], [address], [place], [place]  [address] ?  [address] [address], , [place]   [address] MAIN: [phone] ? TOLL FREE: [phone] ? FAX: [phone] [email or web address]  

## 2012-01-08 NOTE — Progress Notes (Signed)
Subjective:     Patient ID: Brianna [name], female   DOB: 1925-08-30, 78 y.o.   MRN: [id number]  HPI  Brianna [name]  11/11/1924 [id number]  Patient Care Team: Ernestina Penna, MD as PCP - General (Family Medicine)  This patient is a 78 y.o.female who presents today for surgical evaluation.   Procedure: Laparoscopic repair of ventral incisional hernia, bilateral inguinal hernia was, obturator hernia on right side.  The patient comes in today doing well. She had some bruising in her left groin which concerned her but that faded away. She had some pains / pulls when she is active. However they've nearly faded away. She rarely needs a Tylenol now. She is wanting to get back to mowing her lawn. She had some questions about someone told her calcium increased her blood pressure. No constipation. No difficulty with urination. Very much back to regular database stuff now.  Patient Active Problem List  Diagnoses  . Arthritis    Past Medical History  Diagnosis Date  . Other and unspecified hyperlipidemia   . Lumbosacral spondylosis without myelopathy   . Fibrocystic breast   . Osteoporosis   . Symptomatic menopausal or female climacteric states   . Colon polyp   . Hypertension   . 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 PONV (postoperative nausea and vomiting) 11-15-11    after anesthesia  . Dysrhythmia 11-15-11    was told has a slight irregular beat by PCP.  Marland Kitchen COPD (chronic obstructive pulmonary disease) 11-15-11    pt. not aware. &'04 CXR -abnormal findings  . Hypothyroidism 11-15-11    tx. meds.-hypo  . Ventral hernia 11-15-11    surgery is planned  . Bilateral inguinal hernia (BIH) 11/21/2011  . Incisional hernia 10/12/2011  . Obturator hernia, right 01/08/2012    Past Surgical History  Procedure Date  . Cesarean section   . Tonsillectomy   . Appendectomy   . Cataract extraction w/ intraocular lens  implant, bilateral   . Colon surgery 2004    8'04-open LAR for rectosigmoid  cancer T1N0 within polyp  . Ventral hernia repair 11/21/2011    Procedure: LAPAROSCOPIC VENTRAL HERNIA;  Surgeon: Ardeth Sportsman, MD;  Location: [facility];  Service: General;  Laterality: N/A;  laparoscopic bilateral inguinal hernia repair with mesh and ventral hernia repair with mesh  . Laparoscopy 11/21/2011    R obturator hernia repair  . Inguinal hernia repair 11/21/2011    Procedure: LAPAROSCOPIC BILATERAL INGUINAL HERNIA REPAIR;  Surgeon: Ardeth Sportsman, MD;  Location: [facility];  Service: General;  Laterality: N/A;    History   Social History  . Marital Status: Widowed    Spouse Name: N/A    Number of Children: N/A  . Years of Education: N/A   Occupational History  . Not on file.   Social History Main Topics  . Smoking status: Never Smoker   . Smokeless tobacco: Not on file  . Alcohol Use: No  . Drug Use: No  . Sexually Active: No   Other Topics Concern  . Not on file   Social History Narrative  . No narrative on file    Family History  Problem Relation Age of Onset  . Heart disease Mother   . Heart disease Father   . Cancer Brother     lung    Current outpatient prescriptions:acetaminophen (TYLENOL) 500 MG tablet, Take 500 mg by mouth every 6 (six) hours as needed. Pain  , Disp: ,  Rfl: ;  aspirin 81 MG EC tablet, Take 81 mg by mouth 3 (three) times a week. Monday, Wednesday, and Friday ., Disp: , Rfl: ;  atorvastatin (LIPITOR) 10 MG tablet, Take 5 mg by mouth at bedtime. , Disp: , Rfl: ;  beta carotene w/minerals (OCUVITE) tablet, Take 1 tablet by mouth daily.  , Disp: , Rfl:  calcium citrate-vitamin D (CITRACAL+D) 315-200 MG-UNIT per tablet, Take 2 tablets by mouth daily. , Disp: , Rfl: ;  Cholecalciferol (VITAMIN D3) 2000 UNITS TABS, Take 1-2 tablets by mouth daily. 1 tab every day except 2 tabs on sat and sun, Disp: , Rfl: ;  Ibuprofen-Diphenhydramine HCl (ADVIL PM) 200-25 MG CAPS, Take 1 capsule by mouth at bedtime.  , Disp: , Rfl:  levothyroxine (SYNTHROID,  LEVOTHROID) 25 MCG tablet, Take 25 mcg by mouth daily before breakfast. , Disp: , Rfl: ;  meloxicam (MOBIC) 15 MG tablet, Take 15 mg by mouth daily as needed. Pain , Disp: , Rfl: ;  Trolamine Salicylate (ASPERCREME) 10 % LOTN, Apply 1 application topically 2 (two) times daily as needed. Pain  , Disp: , Rfl: ;  valsartan (DIOVAN) 160 MG tablet, Take 160 mg by mouth daily after breakfast. , Disp: , Rfl:   Allergies  Allergen Reactions  . Actonel   . Fosamax Nausea Only  . Lodine (Etodolac)     BP 154/84  Pulse 78  Resp 16  Ht 5\' 2"  (1.575 m)  Wt 112 lb (50.803 kg)  BMI 20.49 kg/m2     Review of Systems  Constitutional: Negative for fever, chills and diaphoresis.  HENT: Negative for ear pain, sore throat and trouble swallowing.   Eyes: Negative for photophobia and visual disturbance.  Respiratory: Negative for cough and choking.   Cardiovascular: Negative for chest pain and palpitations.  Gastrointestinal: Negative for nausea, vomiting, abdominal pain, diarrhea, constipation, anal bleeding and rectal pain.  Genitourinary: Negative for dysuria, frequency and difficulty urinating.  Musculoskeletal: Negative for myalgias and gait problem.  Skin: Negative for color change, pallor and rash.  Neurological: Negative for dizziness, speech difficulty, weakness and numbness.  Hematological: Negative for adenopathy.  Psychiatric/Behavioral: Negative for confusion and agitation. The patient is not nervous/anxious.        Objective:   Physical Exam  Constitutional: She is oriented to person, place, and time. She appears well-developed and well-nourished. No distress.  HENT:  Head: Normocephalic.  Mouth/Throat: Oropharynx is clear and moist. No oropharyngeal exudate.  Eyes: Conjunctivae and EOM are normal. Pupils are equal, round, and reactive to light. No scleral icterus.  Neck: Normal range of motion. No tracheal deviation present.  Cardiovascular: Normal rate and intact distal pulses.     Pulmonary/Chest: Effort normal. No respiratory distress. She exhibits no tenderness.  Abdominal: Soft. She exhibits no distension and no mass. There is no rebound and no guarding. Hernia confirmed negative in the right inguinal area and confirmed negative in the left inguinal area.       Incisions clean with normal healing ridges.  No hernias  Genitourinary: No vaginal discharge found.  Musculoskeletal: Normal range of motion. She exhibits no tenderness.  Lymphadenopathy:       Right: No inguinal adenopathy present.       Left: No inguinal adenopathy present.  Neurological: She is alert and oriented to person, place, and time. No cranial nerve deficit. She exhibits normal muscle tone. Coordination normal.  Skin: Skin is warm and dry. No rash noted. She is not diaphoretic.  Psychiatric:  She has a normal mood and affect. Her behavior is normal.       Assessment:     S/p lap repairs of many hernias: incisional/ventral, BIH, Right obturator hernia    Plan:     Increase activity as tolerated.  Do not push through pain.  Advanced on diet as tolerated. Bowel regimen to avoid problems.  I think she looks pretty darn good. I noted vigorous activity as tolerated. I noted that continue living and happy life and return to clinic p.r.n.  The patient expressed understanding and appreciation

## 2013-01-24 ENCOUNTER — Telehealth: Payer: Self-pay | Admitting: *Deleted

## 2013-01-24 DIAGNOSIS — I10 Essential (primary) hypertension: Secondary | ICD-10-CM

## 2013-01-24 NOTE — Telephone Encounter (Signed)
Pt having hip replacement on 03/04/2013, needs medical clearance, per [facility] pt needs cardiac clearance before he will clear

## 2013-02-16 ENCOUNTER — Encounter: Payer: Self-pay | Admitting: Cardiology

## 2013-02-16 DIAGNOSIS — Z01810 Encounter for preprocedural cardiovascular examination: Secondary | ICD-10-CM | POA: Insufficient documentation

## 2013-02-16 DIAGNOSIS — J449 Chronic obstructive pulmonary disease, unspecified: Secondary | ICD-10-CM | POA: Insufficient documentation

## 2013-02-16 DIAGNOSIS — I10 Essential (primary) hypertension: Secondary | ICD-10-CM | POA: Insufficient documentation

## 2013-02-16 DIAGNOSIS — E782 Mixed hyperlipidemia: Secondary | ICD-10-CM | POA: Insufficient documentation

## 2013-02-17 ENCOUNTER — Ambulatory Visit (INDEPENDENT_AMBULATORY_CARE_PROVIDER_SITE_OTHER): Payer: Medicare Other | Admitting: Cardiology

## 2013-02-17 ENCOUNTER — Ambulatory Visit: Payer: Medicare Other | Admitting: Cardiology

## 2013-02-17 ENCOUNTER — Encounter: Payer: Self-pay | Admitting: Cardiology

## 2013-02-17 VITALS — BP 147/77 | HR 82 | Ht 62.0 in | Wt 113.4 lb

## 2013-02-17 DIAGNOSIS — Z01818 Encounter for other preprocedural examination: Secondary | ICD-10-CM

## 2013-02-17 DIAGNOSIS — E782 Mixed hyperlipidemia: Secondary | ICD-10-CM

## 2013-02-17 DIAGNOSIS — I10 Essential (primary) hypertension: Secondary | ICD-10-CM

## 2013-02-17 DIAGNOSIS — R9431 Abnormal electrocardiogram [ECG] [EKG]: Secondary | ICD-10-CM

## 2013-02-17 DIAGNOSIS — Z01810 Encounter for preprocedural cardiovascular examination: Secondary | ICD-10-CM

## 2013-02-17 NOTE — Assessment & Plan Note (Signed)
On ARB, followed by Dr. Christell Constant.

## 2013-02-17 NOTE — Assessment & Plan Note (Signed)
Patient being considered for elective right hip arthroplasty. No clear history of cardiac disease as detailed above. ECG does show a right bundle branch block. She reports no exertional chest pain or breathlessness, no palpitations. She mentions that Dr. Christell Constant told her that she had an "irregular heartbeat" in the past, details not clear. She is in sinus rhythm at the present time. Main limitation is right hip pain. She describes functional activity exceeding 4 METS. Plan is to obtain an echocardiogram to assess cardiac structure and function, and unless any significant abnormalities are uncovered, I would expect that she should be able to proceed with planned surgery at an acceptable perioperative cardiac risk.

## 2013-02-17 NOTE — Patient Instructions (Addendum)
Your physician has requested that you have an echocardiogram. Echocardiography is a painless test that uses sound waves to create images of your heart. It provides your doctor with information about the size and shape of your heart and how well your heart's chambers and valves are working. This procedure takes approximately one hour. There are no restrictions for this procedure. Your physician recommends that you continue on your current medications as directed. Please refer to the Current Medication list given to you today. We will call you with your results.

## 2013-02-17 NOTE — Progress Notes (Signed)
Clinical Summary Brianna [name] is an 79 y.o.female referred for cardiac preoperative evaluation. Her primary care physician is Dr. Christell Constant. She is being considered for elective right total hip arthroplasty by Dr. Charlann Boxer.  She is here with her daughter today. She has no history of known CAD, myocardial infarction, cardiac arrhythmia, or congestive heart failure based on her recollection or available information. ECG today shows sinus rhythm with right bundle branch block. At baseline, she is limited by right hip pain, uses a cane to walk. She is able to function in her daily activities including housework. She states she is able to walk up a flight of steps without stopping. She reports no exertional chest pain or unusual breathlessness.  Medications were reviewed. She reports long-standing history of hypertension and hyperlipidemia.  She has had no prior cardiac testing.   Allergies  Allergen Reactions  . Alendronate Sodium Nausea Only  . Lodine (Etodolac)   . Risedronate Sodium     Current Outpatient Prescriptions  Medication Sig Dispense Refill  . acetaminophen (TYLENOL) 500 MG tablet Take 500 mg by mouth every 6 (six) hours as needed. Pain        . aspirin 81 MG EC tablet Take 81 mg by mouth 3 (three) times a week. Monday, Wednesday, and Friday .      Marland Kitchen atorvastatin (LIPITOR) 10 MG tablet Take 5 mg by mouth at bedtime.       . beta carotene w/minerals (OCUVITE) tablet Take 1 tablet by mouth daily.        . Calcium Citrate-Vitamin D (CITRACAL + D PO) Take 1 tablet by mouth daily.      . Cholecalciferol (VITAMIN D3) 2000 UNITS TABS Take 1-2 tablets by mouth daily. 1 tab every day except 2 tabs on sat and sun      . Fish Oil OIL Take 5 mLs by mouth daily.      . hydrochlorothiazide (MICROZIDE) 12.5 MG capsule Take 12.5 mg by mouth daily.      Marland Kitchen levothyroxine (SYNTHROID, LEVOTHROID) 25 MCG tablet Take 25 mcg by mouth daily before breakfast.       . meloxicam (MOBIC) 15 MG tablet Take 15 mg  by mouth daily as needed. Pain       . traMADol (ULTRAM) 50 MG tablet Take 50 mg by mouth every 6 (six) hours as needed for pain.      Terrance Mass Salicylate (ASPERCREME) 10 % LOTN Apply 1 application topically 2 (two) times daily as needed. Pain        . valsartan (DIOVAN) 160 MG tablet Take 80 mg by mouth daily after breakfast.        No current facility-administered medications for this visit.    Past Medical History  Diagnosis Date  . Mixed hyperlipidemia   . Lumbosacral spondylosis without myelopathy   . Fibrocystic breast   . Osteoporosis   . Symptomatic menopausal or female climacteric states   . Colon polyp   . Essential hypertension, benign   . Rectosigmoid cancer     T1N0, 2004  . PONV (postoperative nausea and vomiting)   . COPD (chronic obstructive pulmonary disease)   . Hypothyroidism   . Ventral hernia   . Bilateral inguinal hernia (BIH)   . Obturator hernia, right     Past Surgical History  Procedure Laterality Date  . Cesarean section    . Tonsillectomy    . Appendectomy    . Cataract extraction w/ intraocular lens  implant, bilateral    .  Colon surgery  2004    8'04-open LAR for rectosigmoid cancer T1N0 within polyp  . Ventral hernia repair  11/21/2011    Procedure: LAPAROSCOPIC VENTRAL HERNIA;  Surgeon: Ardeth Sportsman, MD;  Location: [facility];  Service: General;  Laterality: N/A;  laparoscopic bilateral inguinal hernia repair with mesh and ventral hernia repair with mesh  . Laparoscopy  11/21/2011    R obturator hernia repair  . Inguinal hernia repair  11/21/2011    Procedure: LAPAROSCOPIC BILATERAL INGUINAL HERNIA REPAIR;  Surgeon: Ardeth Sportsman, MD;  Location: [facility];  Service: General;  Laterality: N/A;    Family History  Problem Relation Age of Onset  . Heart disease Mother   . Heart disease Father   . Lung cancer Brother     Social History Ms. Procell reports that she has never smoked. She does not have any smokeless tobacco history on  file. Ms. Loewe reports that she does not drink alcohol.  Review of Systems Head no palpitations, dizziness, syncope. Otherwise negative.  Physical Examination Filed Vitals:   02/17/13 1446  BP: 147/77  Pulse: 82   Filed Weights   02/17/13 1446  Weight: 113 lb 6.4 oz (51.438 kg)   Patient in no acute distress, appears somewhat younger than stated age. HEENT: Conjunctiva and lids normal, oropharynx clear. Neck: Supple, no elevated JVP or carotid bruits, no thyromegaly. Lungs: Clear to auscultation, nonlabored breathing at rest. Cardiac: Regular rate and rhythm, no S3, soft basal systolic murmur, no pericardial rub. Abdomen: Soft, nontender, bowel sounds present. Extremities: No pitting edema, distal pulses 2+. Skin: Warm and dry. Musculoskeletal: No kyphosis. Neuropsychiatric: Alert and oriented x3, affect grossly appropriate.   Problem List and Plan   Preoperative cardiovascular examination Patient being considered for elective right hip arthroplasty. No clear history of cardiac disease as detailed above. ECG does show a right bundle branch block. She reports no exertional chest pain or breathlessness, no palpitations. She mentions that Dr. Christell Constant told her that she had an "irregular heartbeat" in the past, details not clear. She is in sinus rhythm at the present time. Main limitation is right hip pain. She describes functional activity exceeding 4 METS. Plan is to obtain an echocardiogram to assess cardiac structure and function, and unless any significant abnormalities are uncovered, I would expect that she should be able to proceed with planned surgery at an acceptable perioperative cardiac risk.  Mixed hyperlipidemia On Lipitor, followed by Dr. Christell Constant.  Essential hypertension, benign On ARB, followed by Dr. Christell Constant.    Brianna [name], M.D., F.A.C.C.

## 2013-02-17 NOTE — Assessment & Plan Note (Signed)
On Lipitor, followed by Dr. Christell Constant.

## 2013-02-19 ENCOUNTER — Encounter (HOSPITAL_COMMUNITY): Payer: Self-pay | Admitting: Pharmacy Technician

## 2013-02-20 ENCOUNTER — Other Ambulatory Visit (INDEPENDENT_AMBULATORY_CARE_PROVIDER_SITE_OTHER): Payer: Medicare Other

## 2013-02-20 ENCOUNTER — Other Ambulatory Visit: Payer: Self-pay

## 2013-02-20 DIAGNOSIS — I10 Essential (primary) hypertension: Secondary | ICD-10-CM

## 2013-02-20 DIAGNOSIS — R9431 Abnormal electrocardiogram [ECG] [EKG]: Secondary | ICD-10-CM

## 2013-02-20 DIAGNOSIS — Z01810 Encounter for preprocedural cardiovascular examination: Secondary | ICD-10-CM

## 2013-02-25 ENCOUNTER — Telehealth: Payer: Self-pay | Admitting: *Deleted

## 2013-02-25 ENCOUNTER — Ambulatory Visit (HOSPITAL_COMMUNITY)
Admission: RE | Admit: 2013-02-25 | Discharge: 2013-02-25 | Disposition: A | Discharge status: Home / Self Care | Payer: Medicare Other | Source: Ambulatory Visit | Attending: Orthopedic Surgery | Admitting: Orthopedic Surgery

## 2013-02-25 ENCOUNTER — Encounter (HOSPITAL_COMMUNITY)
Admission: RE | Admit: 2013-02-25 | Discharge: 2013-02-25 | Disposition: A | Discharge status: Home / Self Care | Payer: Medicare Other | Source: Ambulatory Visit | Attending: Orthopedic Surgery | Admitting: Orthopedic Surgery

## 2013-02-25 ENCOUNTER — Encounter (HOSPITAL_COMMUNITY): Payer: Self-pay

## 2013-02-25 DIAGNOSIS — I517 Cardiomegaly: Secondary | ICD-10-CM | POA: Insufficient documentation

## 2013-02-25 DIAGNOSIS — M1610 Unilateral primary osteoarthritis, unspecified hip: Secondary | ICD-10-CM | POA: Insufficient documentation

## 2013-02-25 DIAGNOSIS — Z01812 Encounter for preprocedural laboratory examination: Secondary | ICD-10-CM | POA: Insufficient documentation

## 2013-02-25 DIAGNOSIS — M169 Osteoarthritis of hip, unspecified: Secondary | ICD-10-CM | POA: Insufficient documentation

## 2013-02-25 DIAGNOSIS — I771 Stricture of artery: Secondary | ICD-10-CM | POA: Insufficient documentation

## 2013-02-25 HISTORY — DX: Personal history of other malignant neoplasm of skin: Z85.828

## 2013-02-25 HISTORY — DX: Carpal tunnel syndrome, right upper limb: G56.01

## 2013-02-25 HISTORY — DX: Frequency of micturition: R35.0

## 2013-02-25 LAB — BASIC METABOLIC PANEL
BUN: 18 mg/dL (ref 6–23)
Calcium: 9.9 mg/dL (ref 8.4–10.5)
GFR calc Af Amer: 85 mL/min — ABNORMAL LOW (ref >90)
GFR calc non Af Amer: 74 mL/min — ABNORMAL LOW (ref >90)
Glucose, Bld: 96 mg/dL (ref 70–99)
Potassium: 4.3 mEq/L (ref 3.5–5.1)
Sodium: 134 mEq/L — ABNORMAL LOW (ref 135–145)

## 2013-02-25 LAB — SURGICAL PCR SCREEN: Staphylococcus aureus: POSITIVE — AB

## 2013-02-25 LAB — CBC
Hemoglobin: 12.3 g/dL (ref 12.0–15.0)
MCH: 30.8 pg (ref 26.0–34.0)
MCHC: 33.5 g/dL (ref 30.0–36.0)
Platelets: 271 10*3/uL (ref 150–400)
RBC: 4 MIL/uL (ref 3.87–5.11)

## 2013-02-25 LAB — PROTIME-INR
INR: 0.91 (ref 0.00–1.49)
Prothrombin Time: 12.2 seconds (ref 11.6–15.2)

## 2013-02-25 LAB — URINALYSIS, ROUTINE W REFLEX MICROSCOPIC
Bilirubin Urine: NEGATIVE
Hgb urine dipstick: NEGATIVE
Ketones, ur: NEGATIVE mg/dL
Specific Gravity, Urine: 1.013 (ref 1.005–1.030)
pH: 6 (ref 5.0–8.0)

## 2013-02-25 LAB — ABO/RH: ABO/RH(D): O POS

## 2013-02-25 NOTE — Telephone Encounter (Signed)
Copy of result and office note sent to Dr. Charlann Boxer and [facility].

## 2013-02-25 NOTE — Patient Instructions (Signed)
Brianna [name]  02/25/2013                           YOUR PROCEDURE IS SCHEDULED ON: 5/ 6/14               PLEASE REPORT TO SHORT STAY CENTER AT : 7:30 AM               CALL THIS NUMBER IF ANY PROBLEMS THE DAY OF SURGERY :               [phone]                      REMEMBER:   Do not eat food or drink liquids AFTER MIDNIGHT   Take these medicines the morning of surgery with A SIP OF WATER: LEVOTHYROXINE / MAY TAKE TRAMADOL IF NEED FOR PAIN   Do not wear jewelry, make-up   Do not wear lotions, powders, or perfumes.   Do not shave legs or underarms 12 hrs. before surgery (men may shave face)  Do not bring valuables to the hospital.  Contacts, dentures or bridgework may not be worn into surgery.  Leave suitcase in the car. After surgery it may be brought to your room.  For patients admitted to the hospital more than one night, checkout time is 11:00                          The day of discharge.   Patients discharged the day of surgery will not be allowed to drive home                             If going home same day of surgery, must have someone stay with you first                           24 hrs at home and arrange for some one to drive you home from hospital.    Special Instructions:   Please read over the following fact sheets that you were given:               1. MRSA  INFORMATION                      2. Long PREPARING FOR SURGERY SHEET               3.INCENTIVE SPIROMETER                                                X_____________________________________________________________________        Failure to follow these instructions may result in cancellation of your surgery

## 2013-02-25 NOTE — Telephone Encounter (Signed)
Message copied by Eustace Moore on Tue Feb 25, 2013 10:11 AM ------      Message from: Jonelle Sidle      Created: Fri Feb 21, 2013 10:25 AM       Reviewed report. Her LV function is normal. 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 This is likely a chronic condition, fortunately not leading to major limiting symptomatology at this point. Although this will require ongoing followup and discussion (please schedule visit in 3 months), it should not delay her elective hip surgery based on her recent clinical stability without active heart failure symptoms. Overall perioperative risk estimated in the moderate range, main potential issues being congestive heart failure and arrythmia - need to have careful attention to volume of IV fluids given in the perioperative setting and would have her on telemetry. Forward copy of my recent note as well as this note to Dr. Charlann Boxer and Dr. Christell Constant. ------

## 2013-02-25 NOTE — Telephone Encounter (Signed)
Patient informed. 

## 2013-02-26 NOTE — Progress Notes (Signed)
Abnormal PTT and PCR faxed to Dr, Charlann Boxer thru [facility]

## 2013-02-27 ENCOUNTER — Telehealth: Payer: Self-pay | Admitting: Family Medicine

## 2013-02-27 NOTE — Telephone Encounter (Signed)
Called daugher [name]  She informed us pt will be starting new bp med on Friday.  Called pt --left mess on voice mail to call us next week and let us know how she is doing with med

## 2013-03-02 NOTE — H&P (Signed)
TOTAL HIP ADMISSION H&P  Patient is admitted for right total hip arthroplasty, anterior approach.  Subjective:  Chief Complaint: right hip OA / pain  HPI: Brianna [name], 79 y.o. female, has a history of pain and functional disability in the right hip(s) due to arthritis and patient has failed non-surgical conservative treatments for greater than 12 weeks to include NSAID's and/or analgesics, corticosteriod injections, use of assistive devices and activity modification.  Onset of symptoms was gradual starting 2 years ago with rapidlly worsening course since that time.The patient noted no past surgery on the right hip(s).  Patient currently rates pain in the right hip at 10 out of 10 with activity. Patient has night pain, worsening of pain with activity and weight bearing, trendelenberg gait, pain that interfers with activities of daily living and pain with passive range of motion. Patient has evidence of periarticular osteophytes and joint space narrowing by imaging studies. This condition presents safety issues increasing the risk of falls. There is no current active infection. Risks, benefits and expectations were discussed with the patient. Patient understand the risks, benefits and expectations and wishes to proceed with surgery.   D/C Plans:   Home with HHPT/SNF  Post-op Meds:   No Rx given  Tranexamic Acid:   To be given  Decadron:    To be given  FYI:   Nothing to note   Patient Active Problem List   Diagnosis Date Noted  . Preoperative cardiovascular examination 02/16/2013  . Essential hypertension, benign 02/16/2013  . Mixed hyperlipidemia 02/16/2013  . COPD (chronic obstructive pulmonary disease) 02/16/2013  . Arthritis 10/12/2011   Past Medical History  Diagnosis Date  . Mixed hyperlipidemia   . Lumbosacral spondylosis without myelopathy   . Fibrocystic breast   . Osteoporosis   . Symptomatic menopausal or female climacteric states   . Colon polyp   . Essential  hypertension, benign   . Rectosigmoid cancer     T1N0, 2004  . PONV (postoperative nausea and vomiting)   . COPD (chronic obstructive pulmonary disease)   . Hypothyroidism   . Ventral hernia   . Bilateral inguinal hernia (BIH)   . Obturator hernia, right   . MVP (mitral valve prolapse)     PER 2D ECHO REPORT  . Carpal tunnel syndrome on right   . Frequency of urination   . History of skin cancer     Past Surgical History  Procedure Laterality Date  . Cesarean section    . Appendectomy    . Cataract extraction w/ intraocular lens  implant, bilateral    . Colon surgery  2004    8'04-open LAR for rectosigmoid cancer T1N0 within polyp  . Ventral hernia repair  11/21/2011    Procedure: LAPAROSCOPIC VENTRAL HERNIA;  Surgeon: Ardeth Sportsman, MD;  Location: [facility];  Service: General;  Laterality: N/A;  laparoscopic bilateral inguinal hernia repair with mesh and ventral hernia repair with mesh  . Laparoscopy  11/21/2011    R obturator hernia repair  . Inguinal hernia repair  11/21/2011    Procedure: LAPAROSCOPIC BILATERAL INGUINAL HERNIA REPAIR;  Surgeon: Ardeth Sportsman, MD;  Location: [facility];  Service: General;  Laterality: N/A;  . Tonsillectomy      No prescriptions prior to admission   Allergies  Allergen Reactions  . Alendronate Sodium Nausea Only  . Lodine (Etodolac)     unknown  . Risedronate Sodium     unknown    History  Substance Use Topics  .  Smoking status: Never Smoker   . Smokeless tobacco: Not on file  . Alcohol Use: No    Family History  Problem Relation Age of Onset  . Heart disease Mother   . Heart disease Father   . Lung cancer Brother      Review of Systems  Constitutional: Negative.   HENT: Negative.   Eyes: Negative.   Respiratory: Negative.   Cardiovascular: Negative.   Genitourinary: Positive for urgency and frequency.  Musculoskeletal: Positive for myalgias, back pain and joint pain.  Skin: Negative.   Neurological: Negative.    Endo/Heme/Allergies: Negative.   Psychiatric/Behavioral: Negative.     Objective:  Physical Exam  Constitutional: She is oriented to person, place, and time. She appears well-developed and well-nourished.  HENT:  Head: Normocephalic and atraumatic.  Mouth/Throat: Oropharynx is clear and moist.  Eyes: Pupils are equal, round, and reactive to light.  Neck: Neck supple. No JVD present. No tracheal deviation present. No thyromegaly present.  Cardiovascular: Normal rate, regular rhythm, normal heart sounds and intact distal pulses.   Respiratory: Effort normal and breath sounds normal. No stridor. No respiratory distress. She has no wheezes.  GI: Soft. There is no tenderness. There is no guarding.  Musculoskeletal:       Right hip: She exhibits decreased range of motion, decreased strength, tenderness, bony tenderness and crepitus. She exhibits no swelling, no deformity and no laceration.  Lymphadenopathy:    She has no cervical adenopathy.  Neurological: She is alert and oriented to person, place, and time.  Skin: Skin is dry.  Psychiatric: She has a normal mood and affect.    Labs:  Estimated body mass index is 20.48 kg/(m^2) as calculated from the following:   Height as of 01/08/12: 5\' 2"  (1.575 m).   Weight as of 01/08/12: 50.803 kg (112 lb).   Imaging Review Plain radiographs demonstrate severe degenerative joint disease of the right hip(s). The bone quality appears to be good for age and reported activity level.  Assessment/Plan:  End stage arthritis, right hip(s)  The patient history, physical examination, clinical judgement of the provider and imaging studies are consistent with end stage degenerative joint disease of the right hip(s) and total hip arthroplasty is deemed medically necessary. The treatment options including medical management, injection therapy, arthroscopy and arthroplasty were discussed at length. The risks and benefits of total hip arthroplasty were  presented and reviewed. The risks due to aseptic loosening, infection, stiffness, dislocation/subluxation,  thromboembolic complications and other imponderables were discussed.  The patient acknowledged the explanation, agreed to proceed with the plan and consent was signed. Patient is being admitted for inpatient treatment for surgery, pain control, PT, OT, prophylactic antibiotics, VTE prophylaxis, progressive ambulation and ADL's and discharge planning.The patient is planning to be discharged to skilled nursing facility or home depending on progress.Anastasio Auerbach Vincie Linn   PAC  03/02/2013, 10:40 PM

## 2013-03-04 ENCOUNTER — Encounter (HOSPITAL_COMMUNITY)
Admission: RE | Disposition: A | Discharge status: Skilled Nursing Facility | Payer: Self-pay | Source: Ambulatory Visit | Attending: Orthopedic Surgery

## 2013-03-04 ENCOUNTER — Encounter (HOSPITAL_COMMUNITY): Payer: Self-pay | Admitting: *Deleted

## 2013-03-04 ENCOUNTER — Encounter (HOSPITAL_COMMUNITY): Payer: Self-pay | Admitting: Anesthesiology

## 2013-03-04 ENCOUNTER — Inpatient Hospital Stay (HOSPITAL_COMMUNITY): Payer: Medicare Other | Admitting: Anesthesiology

## 2013-03-04 ENCOUNTER — Inpatient Hospital Stay (HOSPITAL_COMMUNITY): Payer: Medicare Other

## 2013-03-04 ENCOUNTER — Inpatient Hospital Stay (HOSPITAL_COMMUNITY)
Admission: RE | Admit: 2013-03-04 | Discharge: 2013-03-07 | DRG: 470 | Disposition: A | Discharge status: Skilled Nursing Facility | Payer: Medicare Other | Source: Ambulatory Visit | Attending: Orthopedic Surgery | Admitting: Orthopedic Surgery

## 2013-03-04 DIAGNOSIS — J449 Chronic obstructive pulmonary disease, unspecified: Secondary | ICD-10-CM | POA: Diagnosis present

## 2013-03-04 DIAGNOSIS — E782 Mixed hyperlipidemia: Secondary | ICD-10-CM | POA: Diagnosis present

## 2013-03-04 DIAGNOSIS — J4489 Other specified chronic obstructive pulmonary disease: Secondary | ICD-10-CM | POA: Diagnosis present

## 2013-03-04 DIAGNOSIS — E039 Hypothyroidism, unspecified: Secondary | ICD-10-CM | POA: Diagnosis present

## 2013-03-04 DIAGNOSIS — M169 Osteoarthritis of hip, unspecified: Principal | ICD-10-CM | POA: Diagnosis present

## 2013-03-04 DIAGNOSIS — M1610 Unilateral primary osteoarthritis, unspecified hip: Principal | ICD-10-CM | POA: Diagnosis present

## 2013-03-04 DIAGNOSIS — Z96649 Presence of unspecified artificial hip joint: Secondary | ICD-10-CM

## 2013-03-04 DIAGNOSIS — I10 Essential (primary) hypertension: Secondary | ICD-10-CM | POA: Diagnosis present

## 2013-03-04 DIAGNOSIS — I059 Rheumatic mitral valve disease, unspecified: Secondary | ICD-10-CM | POA: Diagnosis present

## 2013-03-04 DIAGNOSIS — M47817 Spondylosis without myelopathy or radiculopathy, lumbosacral region: Secondary | ICD-10-CM | POA: Diagnosis present

## 2013-03-04 DIAGNOSIS — D62 Acute posthemorrhagic anemia: Secondary | ICD-10-CM | POA: Diagnosis not present

## 2013-03-04 DIAGNOSIS — Z85038 Personal history of other malignant neoplasm of large intestine: Secondary | ICD-10-CM

## 2013-03-04 DIAGNOSIS — D500 Iron deficiency anemia secondary to blood loss (chronic): Secondary | ICD-10-CM

## 2013-03-04 HISTORY — PX: TOTAL HIP ARTHROPLASTY: SHX124

## 2013-03-04 LAB — TYPE AND SCREEN
ABO/RH(D): O POS
Antibody Screen: NEGATIVE

## 2013-03-04 SURGERY — ARTHROPLASTY, HIP, TOTAL, ANTERIOR APPROACH
Anesthesia: General | Site: Hip | Laterality: Right | Wound class: Clean

## 2013-03-04 MED ORDER — BISACODYL 10 MG RE SUPP: 10.0000 mg | Freq: Every day | RECTAL | Status: DC | PRN

## 2013-03-04 MED ORDER — HYDROMORPHONE HCL PF 1 MG/ML IJ SOLN
0.2500 mg | INTRAMUSCULAR | Status: DC | PRN
  Administered 2013-03-04 (×4): 0.5 mg via INTRAVENOUS

## 2013-03-04 MED ORDER — HYDROCODONE-ACETAMINOPHEN 7.5-325 MG PO TABS
1.0000 | ORAL_TABLET | ORAL | Status: DC
  Administered 2013-03-04 – 2013-03-05 (×3): 1 via ORAL
  Administered 2013-03-05: 2 via ORAL
  Administered 2013-03-05: 1 via ORAL
  Administered 2013-03-05 – 2013-03-06 (×3): 2 via ORAL
  Filled 2013-03-04 (×2): qty 1
  Filled 2013-03-04 (×5): qty 2
  Filled 2013-03-04: qty 1

## 2013-03-04 MED ORDER — PHENYLEPHRINE HCL 10 MG/ML IJ SOLN
INTRAMUSCULAR | Status: DC | PRN
  Administered 2013-03-04 (×5): 40 ug via INTRAVENOUS

## 2013-03-04 MED ORDER — LACTATED RINGERS IV SOLN
INTRAVENOUS | Status: DC
  Administered 2013-03-04: 1000 mL via INTRAVENOUS

## 2013-03-04 MED ORDER — SODIUM CHLORIDE 0.9 % IV SOLN
100.0000 mL/h | INTRAVENOUS | Status: DC
  Administered 2013-03-04: 100 mL/h via INTRAVENOUS
  Filled 2013-03-04 (×8): qty 1000

## 2013-03-04 MED ORDER — DEXAMETHASONE 6 MG PO TABS
10.0000 mg | ORAL_TABLET | Freq: Once | ORAL | Status: AC
  Administered 2013-03-05: 10 mg via ORAL
  Filled 2013-03-04: qty 1

## 2013-03-04 MED ORDER — LEVOTHYROXINE SODIUM 25 MCG PO TABS
25.0000 ug | ORAL_TABLET | Freq: Every day | ORAL | Status: DC
  Administered 2013-03-05 – 2013-03-07 (×3): 25 ug via ORAL
  Filled 2013-03-04 (×4): qty 1

## 2013-03-04 MED ORDER — CHLORHEXIDINE GLUCONATE 4 % EX LIQD: 60.0000 mL | Freq: Once | CUTANEOUS | Status: DC

## 2013-03-04 MED ORDER — ONDANSETRON HCL 4 MG PO TABS: 4.0000 mg | ORAL_TABLET | Freq: Four times a day (QID) | ORAL | Status: DC | PRN

## 2013-03-04 MED ORDER — FENTANYL CITRATE 0.05 MG/ML IJ SOLN
INTRAMUSCULAR | Status: DC | PRN
  Administered 2013-03-04 (×2): 25 ug via INTRAVENOUS
  Administered 2013-03-04 (×2): 50 ug via INTRAVENOUS

## 2013-03-04 MED ORDER — PROMETHAZINE HCL 25 MG/ML IJ SOLN
6.2500 mg | INTRAMUSCULAR | Status: DC | PRN
  Administered 2013-03-04: 6.25 mg via INTRAVENOUS

## 2013-03-04 MED ORDER — CEFAZOLIN SODIUM-DEXTROSE 2-3 GM-% IV SOLR
2.0000 g | Freq: Four times a day (QID) | INTRAVENOUS | Status: AC
  Administered 2013-03-04 (×2): 2 g via INTRAVENOUS
  Filled 2013-03-04 (×2): qty 50

## 2013-03-04 MED ORDER — ONDANSETRON HCL 4 MG/2ML IJ SOLN
INTRAMUSCULAR | Status: DC | PRN
  Administered 2013-03-04: 4 mg via INTRAVENOUS

## 2013-03-04 MED ORDER — TRANEXAMIC ACID 100 MG/ML IV SOLN
1000.0000 mg | Freq: Once | INTRAVENOUS | Status: AC
  Administered 2013-03-04: 1000 mg via INTRAVENOUS
  Filled 2013-03-04: qty 10

## 2013-03-04 MED ORDER — METHOCARBAMOL 100 MG/ML IJ SOLN
500.0000 mg | Freq: Four times a day (QID) | INTRAVENOUS | Status: DC | PRN
  Administered 2013-03-04: 500 mg via INTRAVENOUS
  Filled 2013-03-04: qty 5

## 2013-03-04 MED ORDER — METHOCARBAMOL 500 MG PO TABS
500.0000 mg | ORAL_TABLET | Freq: Four times a day (QID) | ORAL | Status: DC | PRN
  Administered 2013-03-04 – 2013-03-07 (×6): 500 mg via ORAL
  Filled 2013-03-04 (×6): qty 1

## 2013-03-04 MED ORDER — PROPOFOL 10 MG/ML IV BOLUS
INTRAVENOUS | Status: DC | PRN
  Administered 2013-03-04: 80 mg via INTRAVENOUS

## 2013-03-04 MED ORDER — LIDOCAINE HCL (CARDIAC) 20 MG/ML IV SOLN
INTRAVENOUS | Status: DC | PRN
  Administered 2013-03-04: 40 mg via INTRAVENOUS

## 2013-03-04 MED ORDER — ALUM & MAG HYDROXIDE-SIMETH 200-200-20 MG/5ML PO SUSP: 30.0000 mL | ORAL | Status: DC | PRN

## 2013-03-04 MED ORDER — METOCLOPRAMIDE HCL 10 MG PO TABS: 5.0000 mg | ORAL_TABLET | Freq: Three times a day (TID) | ORAL | Status: DC | PRN

## 2013-03-04 MED ORDER — ROCURONIUM BROMIDE 100 MG/10ML IV SOLN
INTRAVENOUS | Status: DC | PRN
  Administered 2013-03-04: 30 mg via INTRAVENOUS

## 2013-03-04 MED ORDER — ACETAMINOPHEN 10 MG/ML IV SOLN
INTRAVENOUS | Status: DC | PRN
  Administered 2013-03-04: 1000 mg via INTRAVENOUS

## 2013-03-04 MED ORDER — FERROUS SULFATE 325 (65 FE) MG PO TABS
325.0000 mg | ORAL_TABLET | Freq: Three times a day (TID) | ORAL | Status: DC
  Administered 2013-03-05 – 2013-03-07 (×5): 325 mg via ORAL
  Filled 2013-03-04 (×11): qty 1

## 2013-03-04 MED ORDER — MENTHOL 3 MG MT LOZG
1.0000 | LOZENGE | OROMUCOSAL | Status: DC | PRN
  Filled 2013-03-04: qty 9

## 2013-03-04 MED ORDER — DEXAMETHASONE SODIUM PHOSPHATE 10 MG/ML IJ SOLN
10.0000 mg | Freq: Once | INTRAMUSCULAR | Status: AC
  Administered 2013-03-04: 10 mg via INTRAVENOUS

## 2013-03-04 MED ORDER — ATORVASTATIN CALCIUM 10 MG PO TABS
5.0000 mg | ORAL_TABLET | Freq: Every day | ORAL | Status: DC
  Administered 2013-03-04: 15:00:00 via ORAL
  Administered 2013-03-05 – 2013-03-06 (×2): 5 mg via ORAL
  Filled 2013-03-04 (×5): qty 0.5

## 2013-03-04 MED ORDER — DIPHENHYDRAMINE HCL 25 MG PO CAPS: 25.0000 mg | ORAL_CAPSULE | Freq: Four times a day (QID) | ORAL | Status: DC | PRN

## 2013-03-04 MED ORDER — IRBESARTAN 300 MG PO TABS
300.0000 mg | ORAL_TABLET | Freq: Every day | ORAL | Status: DC
  Administered 2013-03-05 – 2013-03-07 (×3): 300 mg via ORAL
  Filled 2013-03-04 (×5): qty 1

## 2013-03-04 MED ORDER — DOCUSATE SODIUM 100 MG PO CAPS
100.0000 mg | ORAL_CAPSULE | Freq: Two times a day (BID) | ORAL | Status: DC
  Administered 2013-03-04 – 2013-03-07 (×6): 100 mg via ORAL

## 2013-03-04 MED ORDER — FLEET ENEMA 7-19 GM/118ML RE ENEM: 1.0000 | ENEMA | Freq: Once | RECTAL | Status: AC | PRN

## 2013-03-04 MED ORDER — LACTATED RINGERS IV SOLN
INTRAVENOUS | Status: DC | PRN
  Administered 2013-03-04: 09:00:00 via INTRAVENOUS

## 2013-03-04 MED ORDER — GLYCOPYRROLATE 0.2 MG/ML IJ SOLN
INTRAMUSCULAR | Status: DC | PRN
  Administered 2013-03-04: 0.2 mg via INTRAVENOUS

## 2013-03-04 MED ORDER — ZOLPIDEM TARTRATE 5 MG PO TABS: 5.0000 mg | ORAL_TABLET | Freq: Every evening | ORAL | Status: DC | PRN

## 2013-03-04 MED ORDER — DEXAMETHASONE SODIUM PHOSPHATE 10 MG/ML IJ SOLN: 10.0000 mg | Freq: Once | INTRAMUSCULAR | Status: DC

## 2013-03-04 MED ORDER — CELECOXIB 200 MG PO CAPS
200.0000 mg | ORAL_CAPSULE | Freq: Two times a day (BID) | ORAL | Status: DC
  Administered 2013-03-04 – 2013-03-07 (×7): 200 mg via ORAL
  Filled 2013-03-04 (×8): qty 1

## 2013-03-04 MED ORDER — METOCLOPRAMIDE HCL 5 MG/ML IJ SOLN: 5.0000 mg | Freq: Three times a day (TID) | INTRAMUSCULAR | Status: DC | PRN

## 2013-03-04 MED ORDER — FENTANYL CITRATE 0.05 MG/ML IJ SOLN
25.0000 ug | INTRAMUSCULAR | Status: DC | PRN
  Administered 2013-03-04 (×2): 50 ug via INTRAVENOUS

## 2013-03-04 MED ORDER — POLYETHYLENE GLYCOL 3350 17 G PO PACK
17.0000 g | PACK | Freq: Two times a day (BID) | ORAL | Status: DC
  Administered 2013-03-05 – 2013-03-06 (×4): 17 g via ORAL

## 2013-03-04 MED ORDER — CEFAZOLIN SODIUM-DEXTROSE 2-3 GM-% IV SOLR
2.0000 g | INTRAVENOUS | Status: AC
  Administered 2013-03-04: 2 g via INTRAVENOUS

## 2013-03-04 MED ORDER — HYDROCHLOROTHIAZIDE 25 MG PO TABS
12.5000 mg | ORAL_TABLET | Freq: Every day | ORAL | Status: DC
  Filled 2013-03-04 (×2): qty 0.5

## 2013-03-04 MED ORDER — RIVAROXABAN 10 MG PO TABS
10.0000 mg | ORAL_TABLET | ORAL | Status: DC
  Administered 2013-03-05 – 2013-03-07 (×3): 10 mg via ORAL
  Filled 2013-03-04 (×4): qty 1

## 2013-03-04 MED ORDER — HYDROMORPHONE HCL PF 1 MG/ML IJ SOLN: 0.5000 mg | INTRAMUSCULAR | Status: DC | PRN

## 2013-03-04 MED ORDER — OCUVITE PO TABS
1.0000 | ORAL_TABLET | Freq: Every day | ORAL | Status: DC
  Administered 2013-03-05 – 2013-03-07 (×3): 1 via ORAL
  Filled 2013-03-04 (×4): qty 1

## 2013-03-04 MED ORDER — 0.9 % SODIUM CHLORIDE (POUR BTL) OPTIME
TOPICAL | Status: DC | PRN
  Administered 2013-03-04: 1000 mL

## 2013-03-04 MED ORDER — PHENOL 1.4 % MT LIQD: 1.0000 | OROMUCOSAL | Status: DC | PRN

## 2013-03-04 MED ORDER — ONDANSETRON HCL 4 MG/2ML IJ SOLN: 4.0000 mg | Freq: Four times a day (QID) | INTRAMUSCULAR | Status: DC | PRN

## 2013-03-04 MED ORDER — LACTATED RINGERS IV SOLN
INTRAVENOUS | Status: DC
  Administered 2013-03-04: 12:00:00 via INTRAVENOUS

## 2013-03-04 MED ORDER — NEOSTIGMINE METHYLSULFATE 1 MG/ML IJ SOLN
INTRAMUSCULAR | Status: DC | PRN
  Administered 2013-03-04: 1.5 mg via INTRAVENOUS

## 2013-03-04 SURGICAL SUPPLY — 39 items
ADH SKN CLS APL DERMABOND .7 (GAUZE/BANDAGES/DRESSINGS) ×1
BAG ZIPLOCK 12X15 (MISCELLANEOUS) ×4 IMPLANT
BLADE SAW SGTL 18X1.27X75 (BLADE) ×2 IMPLANT
CLOTH BEACON ORANGE TIMEOUT ST (SAFETY) ×2 IMPLANT
DERMABOND ADVANCED (GAUZE/BANDAGES/DRESSINGS) ×1
DERMABOND ADVANCED .7 DNX12 (GAUZE/BANDAGES/DRESSINGS) ×1 IMPLANT
DRAPE C-ARM 42X72 X-RAY (DRAPES) ×2 IMPLANT
DRAPE STERI IOBAN 125X83 (DRAPES) ×2 IMPLANT
DRAPE U-SHAPE 47X51 STRL (DRAPES) ×6 IMPLANT
DRSG AQUACEL AG ADV 3.5X10 (GAUZE/BANDAGES/DRESSINGS) ×2 IMPLANT
DRSG TEGADERM 4X4.75 (GAUZE/BANDAGES/DRESSINGS) ×2 IMPLANT
DURAPREP 26ML APPLICATOR (WOUND CARE) ×2 IMPLANT
ELECT BLADE TIP CTD 4 INCH (ELECTRODE) ×2 IMPLANT
ELECT REM PT RETURN 9FT ADLT (ELECTROSURGICAL) ×2
ELECTRODE REM PT RTRN 9FT ADLT (ELECTROSURGICAL) ×1 IMPLANT
EVACUATOR 1/8 PVC DRAIN (DRAIN) IMPLANT
FACESHIELD LNG OPTICON STERILE (SAFETY) ×8 IMPLANT
GAUZE SPONGE 2X2 8PLY STRL LF (GAUZE/BANDAGES/DRESSINGS) ×1 IMPLANT
GLOVE BIOGEL PI IND STRL 7.5 (GLOVE) ×1 IMPLANT
GLOVE BIOGEL PI IND STRL 8 (GLOVE) ×1 IMPLANT
GLOVE BIOGEL PI INDICATOR 7.5 (GLOVE) ×1
GLOVE BIOGEL PI INDICATOR 8 (GLOVE) ×1
GLOVE ECLIPSE 8.0 STRL XLNG CF (GLOVE) ×2 IMPLANT
GLOVE ORTHO TXT STRL SZ7.5 (GLOVE) ×4 IMPLANT
GOWN BRE IMP PREV XXLGXLNG (GOWN DISPOSABLE) ×2 IMPLANT
GOWN STRL NON-REIN LRG LVL3 (GOWN DISPOSABLE) ×2 IMPLANT
KIT BASIN OR (CUSTOM PROCEDURE TRAY) ×2 IMPLANT
PACK TOTAL JOINT (CUSTOM PROCEDURE TRAY) ×2 IMPLANT
PADDING CAST COTTON 6X4 STRL (CAST SUPPLIES) ×2 IMPLANT
SPONGE GAUZE 2X2 STER 10/PKG (GAUZE/BANDAGES/DRESSINGS) ×1
SUCTION FRAZIER 12FR DISP (SUCTIONS) ×2 IMPLANT
SUT MNCRL AB 4-0 PS2 18 (SUTURE) ×2 IMPLANT
SUT VIC AB 1 CT1 36 (SUTURE) ×6 IMPLANT
SUT VIC AB 2-0 CT1 27 (SUTURE) ×2
SUT VIC AB 2-0 CT1 TAPERPNT 27 (SUTURE) ×2 IMPLANT
SUT VLOC 180 0 24IN GS25 (SUTURE) ×2 IMPLANT
TOWEL OR 17X26 10 PK STRL BLUE (TOWEL DISPOSABLE) ×4 IMPLANT
TRAY FOLEY CATH 14FRSI W/METER (CATHETERS) ×2 IMPLANT
WATER STERILE IRR 1500ML POUR (IV SOLUTION) ×4 IMPLANT

## 2013-03-04 NOTE — Op Note (Signed)
NAME:  Brianna [name]                ACCOUNT NO.: [id number]      MEDICAL RECORD NO.: [id number]      FACILITY:  [facility]      PHYSICIAN:  Durene Romans D  DATE OF BIRTH:  Aug 11, 1925     DATE OF PROCEDURE:  03/04/2013                                 OPERATIVE REPORT         PREOPERATIVE DIAGNOSIS: Right  hip osteoarthritis.      POSTOPERATIVE DIAGNOSIS:  Right hip osteoarthritis.      PROCEDURE:  Right total hip replacement through an anterior approach   utilizing DePuy THR system, component size 50mm pinnacle cup, a size 32+4 neutral   Altrex liner, a size 6 standard Tri Lock stem with a 32+9 metal ball.      SURGEON:  Madlyn Frankel. Charlann Boxer, M.D.      ASSISTANT:  Lanney Gins, PA-C      ANESTHESIA:  General.      SPECIMENS:  None.      COMPLICATIONS:  None.      BLOOD LOSS:  250 cc     DRAINS:  One Hemovac.      INDICATION OF THE PROCEDURE:  Brianna [name] is a 79 y.o. female who had   presented to office for evaluation of right hip pain.  Radiographs revealed   progressive degenerative changes with bone-on-bone   articulation to the  hip joint.  The patient had painful limited range of   motion significantly affecting their overall quality of life.  The patient was failing to    respond to conservative measures, and at this point was ready   to proceed with more definitive measures.  The patient has noted progressive   degenerative changes in his hip, progressive problems and dysfunction   with regarding the hip prior to surgery.  Consent was obtained for   benefit of pain relief.  Specific risk of infection, DVT, component   failure, dislocation, need for revision surgery, as well discussion of   the anterior versus posterior approach were reviewed.  Consent was   obtained for benefit of anterior pain relief through an anterior   approach.      PROCEDURE IN DETAIL:  The patient was brought to operative theater.   Once adequate anesthesia,  preoperative antibiotics, 2gm Ancef administered.   The patient was positioned supine on the OSI Hanna table.  Once adequate   padding of boney process was carried out, we had predraped out the hip, and  used fluoroscopy to confirm orientation of the pelvis and position.      The right hip was then prepped and draped from proximal iliac crest to   mid thigh with shower curtain technique.      Time-out was performed identifying the patient, planned procedure, and   extremity.     An incision was then made 2 cm distal and lateral to the   anterior superior iliac spine extending over the orientation of the   tensor fascia lata muscle and sharp dissection was carried down to the   fascia of the muscle and protractor placed in the soft tissues.      The fascia was then incised.  The muscle belly was identified and swept  laterally and retractor placed along the superior neck.  Following   cauterization of the circumflex vessels and removing some pericapsular   fat, a second cobra retractor was placed on the inferior neck.  A third   retractor was placed on the anterior acetabulum after elevating the   anterior rectus.  A L-capsulotomy was along the line of the   superior neck to the trochanteric fossa, then extended proximally and   distally.  Tag sutures were placed and the retractors were then placed   intracapsular.  We then identified the trochanteric fossa and   orientation of my neck cut, confirmed this radiographically   and then made a neck osteotomy with the femur on traction.  The femoral   head was removed without difficulty or complication.  Traction was let   off and retractors were placed posterior and anterior around the   acetabulum.      The labrum and foveal tissue were debrided.  I began reaming with a 45mm   reamer and reamed up to 49mm reamer with good bony bed preparation and a 50   cup was chosen.  The final 50mm Pinnacle cup was then impacted under fluoroscopy  to  confirm the depth of penetration and orientation with respect to   abduction.  A screw was placed followed by the hole eliminator.  The final   32+4 neutral Altrex liner was impacted with good visualized rim fit.  The cup was positioned anatomically within the acetabular portion of the pelvis.      At this point, the femur was rolled at 80 degrees.  Further capsule was   released off the inferior aspect of the femoral neck.  I then   released the superior capsule proximally.  The hook was placed laterally   along the femur and elevated manually and held in position with the bed   hook.  The leg was then extended and adducted with the leg rolled to 100   degrees of external rotation.  Once the proximal femur was fully   exposed, I used a box osteotome to set orientation.  I then began   broaching with the starting chili pepper broach and passed this by hand and then broached up to 6.  With the 6 broach in place I chose a standard offset neck and did a trial reduction first with a +1 trial ball but chose to increase to +9 to match both offset and length.  With the +9 ball the offset was appropriate, leg lengths   appeared to be equal, confirmed radiographically.   Given these findings, I went ahead and dislocated the hip, repositioned all   retractors and positioned the right hip in the extended and abducted position.  The final 6 standard Tri Lock stem was   chosen and it was impacted down to the level of neck cut.  Based on this   and the trial reduction, a 32+9 meatl ball was chosen based on her age and projected activity level and   impacted onto a clean and dry trunnion, and the hip was reduced.  The   hip had been irrigated throughout the case again at this point.  I did   reapproximate the superior capsular leaflet to the anterior leaflet   using #1 Vicryl, placed a medium Hemovac drain deep.  The fascia of the   tensor fascia lata muscle was then reapproximated using #1 Vicryl.  The    remaining wound was closed with 2-0 Vicryl  and running 4-0 Monocryl.   The hip was cleaned, dried, and dressed sterilely using Dermabond and   Aquacel dressing.  Drain site dressed separately.  She was then brought   to recovery room in stable condition tolerating the procedure well.    Lanney Gins, PA-C was present for the entirety of the case involved from   preoperative positioning, perioperative retractor management, general   facilitation of the case, as well as primary wound closure as assistant.            Madlyn Frankel Charlann Boxer, M.D.            MDO/MEDQ  D:  08/22/2011  T:  08/22/2011  Job:  [id number]      Electronically Signed by Durene Romans M.D. on 08/28/2011 09:15:38 AM

## 2013-03-04 NOTE — Transfer of Care (Signed)
Immediate Anesthesia Transfer of Care Note  Patient: Brianna [name]  Procedure(s) Performed: Procedure(s): RIGHT TOTAL HIP ARTHROPLASTY ANTERIOR APPROACH (Right)  Patient Location: PACU  Anesthesia Type:General  Level of Consciousness: oriented, sedated, patient cooperative and responds to stimulation  Airway & Oxygen Therapy: Patient Spontanous Breathing and Patient connected to face mask oxygen  Post-op Assessment: Report given to PACU RN and Post -op Vital signs reviewed and stable  Post vital signs: Reviewed and stable  Complications: No apparent anesthesia complications

## 2013-03-04 NOTE — Anesthesia Postprocedure Evaluation (Signed)
Anesthesia Post Note  Patient: Brianna [name]  Procedure(s) Performed: Procedure(s) (LRB): RIGHT TOTAL HIP ARTHROPLASTY ANTERIOR APPROACH (Right)  Anesthesia type: General  Patient location: PACU  Post pain: Pain level controlled  Post assessment: Post-op Vital signs reviewed  Last Vitals:  Filed Vitals:   03/04/13 1420  BP: 132/66  Pulse: 78  Temp: 36.7 C  Resp: 16    Post vital signs: Reviewed  Level of consciousness: sedated  Complications: No apparent anesthesia complications

## 2013-03-04 NOTE — Progress Notes (Signed)
Clinical Social Work Department CLINICAL SOCIAL WORK PLACEMENT NOTE 03/04/2013  Patient:  TENAE, GRAZIOSI  Account Number:  [id number] Admit date:  03/04/2013  Clinical Social Worker:  Cori Razor, LCSW  Date/time:  03/04/2013 03:37 PM  Clinical Social Work is seeking post-discharge placement for this patient at the following level of care:   SKILLED NURSING   (*CSW will update this form in Epic as items are completed)     Patient/family provided with [facility] Department of Clinical Social Work's list of facilities offering this level of care within the geographic area requested by the patient (or if unable, by the patient's family).  03/04/2013  Patient/family informed of their freedom to choose among providers that offer the needed level of care, that participate in Medicare, Medicaid or managed care program needed by the patient, have an available bed and are willing to accept the patient.  03/04/2013  Patient/family informed of [facility]' ownership interest in [facility], as well as of the fact that they are under no obligation to receive care at this facility.  PASARR submitted to EDS on 03/04/2013 PASARR number received from [employer] on 03/04/2013  FL2 transmitted to all facilities in geographic area requested by pt/family on  03/04/2013 FL2 transmitted to all facilities within larger geographic area on   Patient informed that his/her managed care company has contracts with or will negotiate with  certain facilities, including the following:     Patient/family informed of bed offers received:   Patient chooses bed at  Physician recommends and patient chooses bed at    Patient to be transferred to  on   Patient to be transferred to facility by   The following physician request were entered in Epic:   Additional Comments:  Cori Razor LCSW [phone]

## 2013-03-04 NOTE — Anesthesia Preprocedure Evaluation (Addendum)
Anesthesia Evaluation  Patient identified by MRN, date of birth, ID band Patient awake    Reviewed: Allergy & Precautions, H&P , NPO status , Patient's Chart, lab work & pertinent test results  History of Anesthesia Complications (+) PONV  Airway Mallampati: II TM Distance: >3 FB Neck ROM: Full    Dental  (+) Teeth Intact, Dental Advisory Given and Caps,    Pulmonary COPD         Cardiovascular hypertension, Pt. on medications negative cardio ROS  + Valvular Problems/Murmurs MVP Rhythm:Regular Rate:Normal     Neuro/Psych  Neuromuscular disease negative neurological ROS  negative psych ROS   GI/Hepatic negative GI ROS, Neg liver ROS,   Endo/Other  Hypothyroidism   Renal/GU negative Renal ROS  negative genitourinary   Musculoskeletal negative musculoskeletal ROS (+)   Abdominal   Peds  Hematology negative hematology ROS (+)   Anesthesia Other Findings   Reproductive/Obstetrics                           Anesthesia Physical Anesthesia Plan  ASA: II  Anesthesia Plan: General   Post-op Pain Management:    Induction: Intravenous  Airway Management Planned: Oral ETT  Additional Equipment:   Intra-op Plan:   Post-operative Plan: Extubation in OR  Informed Consent: I have reviewed the patients History and Physical, chart, labs and discussed the procedure including the risks, benefits and alternatives for the proposed anesthesia with the patient or authorized representative who has indicated his/her understanding and acceptance.   Dental advisory given  Plan Discussed with: CRNA  Anesthesia Plan Comments:         Anesthesia Quick Evaluation

## 2013-03-04 NOTE — Evaluation (Signed)
Physical Therapy Evaluation Patient Details Name: Brianna [name] MRN: [id number] DOB: 1925/09/25 Today's Date: 03/04/2013 Time: 4098-1191 PT Time Calculation (min): 18 min  PT Assessment / Plan / Recommendation Clinical Impression  79 yo female s/p R THA-direct anterior. On, eval pt required Min assist for mobility-able to ambulate ~40 feet with RW. Pt states her plan is for ST rehab. Recommend SNF to improve strength, activity tolerance, ROM, gait and balance in order to regain functional independence.     PT Assessment  Patient needs continued PT services    Follow Up Recommendations  SNF    Does the patient have the potential to tolerate intense rehabilitation      Barriers to Discharge        Equipment Recommendations  Rolling walker with 5" wheels    Recommendations for Other Services OT consult   Frequency 7X/week    Precautions / Restrictions Precautions Precautions: Fall Restrictions Weight Bearing Restrictions: No RLE Weight Bearing: Weight bearing as tolerated   Pertinent Vitals/Pain 5/10 R hip with activity      Mobility  Bed Mobility Bed Mobility: Supine to Sit;Sit to Supine Supine to Sit: 4: Min assist Sit to Supine: 4: Min assist Details for Bed Mobility Assistance: Assist for R LE off/onto bed. vcs safety, technique, hand placement Transfers Transfers: Sit to Stand;Stand to Sit Sit to Stand: 4: Min assist;From bed;From elevated surface Stand to Sit: 4: Min assist;To bed;To elevated surface Details for Transfer Assistance: vcs safety, technique, hand placement. assist to rise, stabilize, control descent Ambulation/Gait Ambulation/Gait Assistance: 4: Min assist Ambulation Distance (Feet): 40 Feet Assistive device: Rolling walker Ambulation/Gait Assistance Details: vcs safety, sequence, technique. assist to stabilize throughout ambulation.  Gait Pattern: Step-to pattern;Decreased stride length;Decreased step length - right    Exercises     PT  Diagnosis: Difficulty walking;Abnormality of gait;Acute pain  PT Problem List: Decreased strength;Decreased range of motion;Decreased activity tolerance;Decreased mobility;Pain;Decreased knowledge of use of DME PT Treatment Interventions: DME instruction;Gait training;Functional mobility training;Therapeutic activities;Therapeutic exercise;Patient/family education   PT Goals Acute Rehab PT Goals PT Goal Formulation: With patient Time For Goal Achievement: 03/11/13 Potential to Achieve Goals: Good Pt will go Supine/Side to Sit: with supervision PT Goal: Supine/Side to Sit - Progress: Goal set today Pt will go Sit to Supine/Side: with supervision PT Goal: Sit to Supine/Side - Progress: Goal set today Pt will go Sit to Stand: with supervision PT Goal: Sit to Stand - Progress: Goal set today Pt will Ambulate: 51 - 150 feet;with supervision;with rolling walker PT Goal: Ambulate - Progress: Goal set today Pt will Perform Home Exercise Program: with supervision, verbal cues required/provided PT Goal: Perform Home Exercise Program - Progress: Goal set today  Visit Information  Last PT Received On: 03/04/13 Assistance Needed: +1    Subjective Data  Subjective: that was better than i thought it would be Patient Stated Goal: rehab   Prior Functioning  Home Living Lives With: Alone Additional Comments: pt plans to d/c to snf for rehab Prior Function Level of Independence: Independent with assistive device(s) Able to Take Stairs?: Yes Driving: Yes Communication Communication: No difficulties    Cognition  Cognition Arousal/Alertness: Awake/alert Behavior During Therapy: WFL for tasks assessed/performed Overall Cognitive Status: Within Functional Limits for tasks assessed    Extremity/Trunk Assessment Right Lower Extremity Assessment RLE ROM/Strength/Tone: Deficits RLE ROM/Strength/Tone Deficits: hip flex 2/5, hip abd/add 2/5, moves anke well Left Lower Extremity Assessment LLE  ROM/Strength/Tone: Deficits Trunk Assessment Trunk Assessment: Normal   Balance  End of Session PT - End of Session Activity Tolerance: Patient tolerated treatment well Patient left: in bed;with call bell/phone within reach;with family/visitor present  GP     [name], MPT Pager: [phone]

## 2013-03-04 NOTE — Progress Notes (Signed)
Clinical Social Work Department BRIEF PSYCHOSOCIAL ASSESSMENT 03/04/2013  Patient:  AVALEEN, BROWNLEY     Account Number:  [id number]     Admit date:  03/04/2013  Clinical Social Worker:  Candie Chroman  Date/Time:  03/04/2013 03:28 PM  Referred by:  Physician  Date Referred:  03/04/2013 Referred for  SNF Placement   Other Referral:   Interview type:  Patient Other interview type:    PSYCHOSOCIAL DATA Living Status:  ALONE Admitted from facility:   Level of care:   Primary support name:  Derl Barrow Primary support relationship to patient:  CHILD, ADULT Degree of support available:   supportive    CURRENT CONCERNS Current Concerns  Post-Acute Placement   Other Concerns:    SOCIAL WORK ASSESSMENT / PLAN Pt is an 79 yr old female living at home prior to hospitalization. CSW met with pt / family to assist with d/c planning. ST Rehab is needed following hospital d/c. Pt is interested in [facility]. SNF'S have been contacted and CSW is waiting for a response. Pt has [employer] which requires prior authorization. CSW will assist with this process.     Assessment/plan status:  Psychosocial Support/Ongoing Assessment of Needs Other assessment/ plan:   Information/referral to community resources:   Insurance coverage reviewed.    PATIENT'S/FAMILY'S RESPONSE TO PLAN OF CARE: Pt looking forward to ST Rehab when ready for d/c.

## 2013-03-04 NOTE — Progress Notes (Signed)
Utilization review completed.  

## 2013-03-04 NOTE — Preoperative (Signed)
Beta Blockers   Reason not to administer Beta Blockers:Not Applicable 

## 2013-03-04 NOTE — Interval H&P Note (Signed)
History and Physical Interval Note:  03/04/2013 8:31 AM  Brianna [name]  has presented today for surgery, with the diagnosis of Right Hip Osteoarthritis  The various methods of treatment have been discussed with the patient and family. After consideration of risks, benefits and other options for treatment, the patient has consented to  Procedure(s): RIGHT TOTAL HIP ARTHROPLASTY ANTERIOR APPROACH (Right) as a surgical intervention .  The patient's history has been reviewed, patient examined, no change in status, stable for surgery.  I have reviewed the patient's chart and labs.  Questions were answered to the patient's satisfaction.     Shelda Pal

## 2013-03-05 ENCOUNTER — Encounter (HOSPITAL_COMMUNITY): Payer: Self-pay | Admitting: Orthopedic Surgery

## 2013-03-05 DIAGNOSIS — D500 Iron deficiency anemia secondary to blood loss (chronic): Secondary | ICD-10-CM

## 2013-03-05 LAB — BASIC METABOLIC PANEL
BUN: 17 mg/dL (ref 6–23)
CO2: 27 mEq/L (ref 19–32)
Chloride: 103 mEq/L (ref 96–112)
GFR calc non Af Amer: 62 mL/min — ABNORMAL LOW (ref >90)
Glucose, Bld: 99 mg/dL (ref 70–99)
Potassium: 4.4 mEq/L (ref 3.5–5.1)
Sodium: 135 mEq/L (ref 135–145)

## 2013-03-05 LAB — CBC
HCT: 28.1 % — ABNORMAL LOW (ref 36.0–46.0)
Hemoglobin: 9.1 g/dL — ABNORMAL LOW (ref 12.0–15.0)
RBC: 3.06 MIL/uL — ABNORMAL LOW (ref 3.87–5.11)
WBC: 6.9 10*3/uL (ref 4.0–10.5)

## 2013-03-05 MED ORDER — HYDROCHLOROTHIAZIDE 12.5 MG PO CAPS
12.5000 mg | ORAL_CAPSULE | Freq: Every day | ORAL | Status: DC
  Administered 2013-03-05 – 2013-03-07 (×3): 12.5 mg via ORAL
  Filled 2013-03-05 (×5): qty 1

## 2013-03-05 NOTE — Evaluation (Signed)
Occupational Therapy Evaluation Patient Details Name: Brianna [name] MRN: [id number] DOB: 1925/04/28 Today's Date: 03/05/2013 Time: 4098-1191 OT Time Calculation (min): 14 min  OT Assessment / Plan / Recommendation Clinical Impression  Pt is s/p direct  anterior THA and displays some decreased strength and independence with ADL. She will benefit from skilled OT services to improve independence with these tasks for next venue of care.     OT Assessment  Patient needs continued OT Services    Follow Up Recommendations  SNF;Supervision/Assistance - 24 hour    Barriers to Discharge      Equipment Recommendations  3 in 1 bedside comode    Recommendations for Other Services    Frequency  Min 2X/week    Precautions / Restrictions Precautions Precautions: Fall Restrictions Weight Bearing Restrictions: No RLE Weight Bearing: Weight bearing as tolerated        ADL  Eating/Feeding: Independent Where Assessed - Eating/Feeding: Chair Grooming: Min guard;Wash/dry hands Where Assessed - Grooming: Unsupported standing Upper Body Bathing: Chest;Right arm;Left arm;Abdomen;Set up Where Assessed - Upper Body Bathing: Unsupported sitting Lower Body Bathing: Minimal assistance Where Assessed - Lower Body Bathing: Supported sit to stand Upper Body Dressing: Set up Where Assessed - Upper Body Dressing: Unsupported sitting Lower Body Dressing: Minimal assistance Where Assessed - Lower Body Dressing: Supported sit to stand Toilet Transfer: Hydrographic surveyor Method: Other (comment) (into bathroom with RW) Toilet Transfer Equipment: Raised toilet seat with arms (or 3-in-1 over toilet) Toileting - Clothing Manipulation and Hygiene: Min guard Where Assessed - Engineer, mining and Hygiene: Standing Equipment Used: Rolling walker ADL Comments: Pt doing well. Needs cues throughout session for hand placement as she tends to reach for the back bars on the walker to help sit  down. Planning SNF    OT Diagnosis: Generalized weakness  OT Problem List: Decreased strength;Decreased knowledge of use of DME or AE OT Treatment Interventions: Self-care/ADL training;DME and/or AE instruction;Therapeutic activities;Patient/family education   OT Goals Acute Rehab OT Goals OT Goal Formulation: With patient Time For Goal Achievement: 03/12/13 Potential to Achieve Goals: Good ADL Goals Pt Will Perform Grooming: with supervision;Standing at sink ADL Goal: Grooming - Progress: Goal set today Pt Will Perform Lower Body Bathing: with supervision;Sit to stand from chair;Sit to stand from bed ADL Goal: Lower Body Bathing - Progress: Goal set today Pt Will Perform Lower Body Dressing: with supervision;Sit to stand from chair;Sit to stand from bed ADL Goal: Lower Body Dressing - Progress: Goal set today Pt Will Transfer to Toilet: with supervision;3-in-1;Ambulation ADL Goal: Toilet Transfer - Progress: Goal set today Pt Will Perform Toileting - Clothing Manipulation: with supervision;Standing ADL Goal: Toileting - Clothing Manipulation - Progress: Goal set today  Visit Information  Last OT Received On: 03/05/13 Assistance Needed: +1 PT/OT Co-Evaluation/Treatment: Yes    Subjective Data  Subjective: I could probably use the bathroom Patient Stated Goal: none stated. agreeable up with PT/OT   Prior Functioning     Home Living Lives With: Alone Additional Comments: pt plans to d/c to snf for rehab Prior Function Level of Independence: Independent with assistive device(s) Able to Take Stairs?: Yes Driving: Yes Communication Communication: No difficulties         Vision/Perception     Cognition  Cognition Arousal/Alertness: Awake/alert Behavior During Therapy: WFL for tasks assessed/performed Overall Cognitive Status: Within Functional Limits for tasks assessed    Extremity/Trunk Assessment Right Upper Extremity Assessment RUE ROM/Strength/Tone: [facility] for  tasks assessed Left Upper Extremity Assessment LUE  ROM/Strength/Tone: [facility] for tasks assessed     Mobility Bed Mobility Bed Mobility: Supine to Sit Supine to Sit: HOB elevated;4: Min assist Details for Bed Mobility Assistance: Assist for R LE off/onto bed. vcs safety, technique, hand placement Transfers Transfers: Sit to Stand;Stand to Sit Sit to Stand: 4: Min guard;With upper extremity assist;From bed;From chair/3-in-1 Stand to Sit: 4: Min guard;To chair/3-in-1 Details for Transfer Assistance: verbal cues for hand placement each transfer     Exercise     Balance Balance Balance Assessed: Yes Dynamic Standing Balance Dynamic Standing - Level of Assistance: 5: Stand by assistance   End of Session OT - End of Session Activity Tolerance: Patient tolerated treatment well Patient left: in chair;with call bell/phone within reach  GO     Lennox Laity [phone] 03/05/2013, 10:23 AM

## 2013-03-05 NOTE — Progress Notes (Signed)
Physical Therapy Treatment Patient Details Name: Brianna [name] MRN: [id number] DOB: 1925-03-05 Today's Date: 03/05/2013 Time: 1325-1350 PT Time Calculation (min): 25 min  PT Assessment / Plan / Recommendation Comments on Treatment Session  Progressing with mobility. Had pt ambulate with shoe on L foot to decrease feeling of r LE feeling longer, however pt stated that she didn't think it helped (even though gait did improve-less vaulting). Continue to recommend SNF    Follow Up Recommendations  SNF     Does the patient have the potential to tolerate intense rehabilitation     Barriers to Discharge        Equipment Recommendations  Rolling walker with 5" wheels    Recommendations for Other Services    Frequency 7X/week   Plan Discharge plan remains appropriate    Precautions / Restrictions Precautions Precautions: Fall Restrictions Weight Bearing Restrictions: No RLE Weight Bearing: Weight bearing as tolerated   Pertinent Vitals/Pain 4/10 R hip    Mobility  Bed Mobility Bed Mobility: Supine to Sit;Sit to Supine Supine to Sit: 4: Min assist Sit to Supine: 4: Min assist Details for Bed Mobility Assistance: Assist for R LE off/onto bed. vcs safety, technique, hand placement Transfers Transfers: Sit to Stand;Stand to Sit Sit to Stand: 4: Min guard;From bed;From toilet Stand to Sit: 4: Min guard;To bed;To toilet Details for Transfer Assistance: verbal cues for hand placement each transfer Ambulation/Gait Ambulation/Gait Assistance: 4: Min assist Ambulation Distance (Feet): 115 Feet Assistive device: Rolling walker Ambulation/Gait Assistance Details: Assist to stabilize intermittently.  Gait Pattern: Step-to pattern;Step-through pattern;Decreased stride length;Wide base of support    Exercises Total Joint Exercises Ankle Circles/Pumps: AROM;Both;10 reps;Supine Quad Sets: AROM;Both;10 reps;Supine Heel Slides: AAROM;Right;10 reps;Supine Hip ABduction/ADduction:  AAROM;Right;10 reps;Supine   PT Diagnosis:    PT Problem List:   PT Treatment Interventions:     PT Goals Acute Rehab PT Goals Pt will go Supine/Side to Sit: with supervision PT Goal: Supine/Side to Sit - Progress: Progressing toward goal Pt will go Sit to Supine/Side: with supervision PT Goal: Sit to Supine/Side - Progress: Progressing toward goal Pt will go Sit to Stand: with supervision PT Goal: Sit to Stand - Progress: Progressing toward goal Pt will Ambulate: 51 - 150 feet;with supervision;with rolling walker PT Goal: Ambulate - Progress: Progressing toward goal Pt will Perform Home Exercise Program: with supervision, verbal cues required/provided PT Goal: Perform Home Exercise Program - Progress: Progressing toward goal  Visit Information  Last PT Received On: 03/05/13 Assistance Needed: +1    Subjective Data  Subjective: Im more sore today Patient Stated Goal: rehab   Cognition       Balance     End of Session PT - End of Session Activity Tolerance: Patient tolerated treatment well Patient left: in bed;with call bell/phone within reach   GP     [name], MPT Pager: [phone]

## 2013-03-05 NOTE — Progress Notes (Signed)
   Subjective: 1 Day Post-Op Procedure(s) (LRB): RIGHT TOTAL HIP ARTHROPLASTY ANTERIOR APPROACH (Right)   Patient reports pain as mild, pain well controlled. No events throughout the night.  Objective:   VITALS:   Filed Vitals:   03/05/13 0538  BP: 128/84  Pulse: 99  Temp: 97.7 F (36.5 C)  Resp: 16    Neurovascular intact Dorsiflexion/Plantar flexion intact Incision: dressing C/D/I No cellulitis present Compartment soft  LABS  Recent Labs  03/05/13 0503  HGB 9.1*  HCT 28.1*  WBC 6.9  PLT 192     Recent Labs  03/05/13 0503  NA 135  K 4.4  BUN 17  CREATININE 0.83  GLUCOSE 99     Assessment/Plan: 1 Day Post-Op Procedure(s) (LRB): RIGHT TOTAL HIP ARTHROPLASTY ANTERIOR APPROACH (Right) HV drain d/c'ed Foley cath d/c'ed Advance diet Up with therapy D/C IV fluids Discharge to SNF eventually when ready  Expected ABLA  Treated with iron and will observe   Anastasio Auerbach. Audianna Landgren   PAC  03/05/2013, 9:07 AM

## 2013-03-05 NOTE — Progress Notes (Signed)
Physical Therapy Treatment Patient Details Name: Brianna [name] MRN: [id number] DOB: 1924/11/17 Today's Date: 03/05/2013 Time: 4098-1191 PT Time Calculation (min): 16 min  PT Assessment / Plan / Recommendation Comments on Treatment Session  Progressing with mobility. Pt states she feels R LE is longer than the other. May try ambulation with 1 shoe on the L next visit. Recommend SNF.     Follow Up Recommendations  SNF     Does the patient have the potential to tolerate intense rehabilitation     Barriers to Discharge        Equipment Recommendations  Rolling walker with 5" wheels    Recommendations for Other Services OT consult  Frequency 7X/week   Plan Discharge plan remains appropriate    Precautions / Restrictions Precautions Precautions: Fall Restrictions Weight Bearing Restrictions: No RLE Weight Bearing: Weight bearing as tolerated   Pertinent Vitals/Pain 5/10 R hip    Mobility  Bed Mobility Bed Mobility: Supine to Sit Supine to Sit: HOB elevated;4: Min assist Details for Bed Mobility Assistance: Assist for R LE off/onto bed. vcs safety, technique, hand placement Transfers Transfers: Sit to Stand;Stand to Sit Sit to Stand: 4: Min guard;With upper extremity assist;From bed;From chair/3-in-1 Stand to Sit: 4: Min guard;To chair/3-in-1 Details for Transfer Assistance: verbal cues for hand placement each transfer Ambulation/Gait Ambulation/Gait Assistance: 4: Min assist Ambulation Distance (Feet): 70 Feet Ambulation/Gait Assistance Details: VCS safety, technique, sequence. Assist to stabilize throughout ambulation.  Gait Pattern: Step-to pattern;Step-through pattern;Decreased stride length;Antalgic;Wide base of support    Exercises     PT Diagnosis:    PT Problem List:   PT Treatment Interventions:     PT Goals Acute Rehab PT Goals Pt will go Supine/Side to Sit: with supervision PT Goal: Supine/Side to Sit - Progress: Progressing toward goal Pt will go Sit  to Stand: with supervision PT Goal: Sit to Stand - Progress: Progressing toward goal Pt will Ambulate: 51 - 150 feet;with supervision;with rolling walker PT Goal: Ambulate - Progress: Progressing toward goal  Visit Information  Last PT Received On: 03/05/13 Assistance Needed: +1 PT/OT Co-Evaluation/Treatment: Yes    Subjective Data  Subjective: Im more sore today Patient Stated Goal: rehab   Cognition  Cognition Arousal/Alertness: Awake/alert Behavior During Therapy: WFL for tasks assessed/performed Overall Cognitive Status: Within Functional Limits for tasks assessed    Balance  Balance Balance Assessed: Yes Dynamic Standing Balance Dynamic Standing - Level of Assistance: 5: Stand by assistance  End of Session PT - End of Session Activity Tolerance: Patient tolerated treatment well Patient left: in chair;with call bell/phone within reach   GP     [name], MPT Pager: [phone]

## 2013-03-06 LAB — BASIC METABOLIC PANEL
BUN: 21 mg/dL (ref 6–23)
CO2: 28 mEq/L (ref 19–32)
Calcium: 9.1 mg/dL (ref 8.4–10.5)
GFR calc non Af Amer: 53 mL/min — ABNORMAL LOW (ref >90)
Glucose, Bld: 140 mg/dL — ABNORMAL HIGH (ref 70–99)
Sodium: 135 mEq/L (ref 135–145)

## 2013-03-06 LAB — CBC
HCT: 30 % — ABNORMAL LOW (ref 36.0–46.0)
Hemoglobin: 9.5 g/dL — ABNORMAL LOW (ref 12.0–15.0)
MCH: 29.2 pg (ref 26.0–34.0)
MCHC: 31.7 g/dL (ref 30.0–36.0)
RBC: 3.25 MIL/uL — ABNORMAL LOW (ref 3.87–5.11)

## 2013-03-06 MED ORDER — TRAMADOL HCL 50 MG PO TABS
50.0000 mg | ORAL_TABLET | Freq: Four times a day (QID) | ORAL | Status: DC | PRN
  Administered 2013-03-06 – 2013-03-07 (×3): 50 mg via ORAL
  Filled 2013-03-06 (×3): qty 1

## 2013-03-06 MED ORDER — TRAMADOL HCL 50 MG PO TABS: 50.0000 mg | ORAL_TABLET | Freq: Four times a day (QID) | ORAL | Status: DC

## 2013-03-06 NOTE — Progress Notes (Signed)
Physical Therapy Treatment Patient Details Name: Brianna [name] MRN: [id number] DOB: Mar 23, 1925 Today's Date: 03/06/2013 Time: 5621-3086 PT Time Calculation (min): 8 min  PT Assessment / Plan / Recommendation Comments on Treatment Session  Progressing with mobility. Pt preferred to ambulate in shoes.Plan is for SNF tomorrow.     Follow Up Recommendations  SNF     Does the patient have the potential to tolerate intense rehabilitation     Barriers to Discharge        Equipment Recommendations  Rolling walker with 5" wheels    Recommendations for Other Services    Frequency 7X/week   Plan Discharge plan remains appropriate    Precautions / Restrictions Precautions Precautions: Fall Restrictions Weight Bearing Restrictions: No RLE Weight Bearing: Weight bearing as tolerated   Pertinent Vitals/Pain 5/10 R hip thigh area "sore"    Mobility  Bed Mobility Bed Mobility: Supine to Sit;Sit to Supine Supine to Sit: 4: Min assist Sit to Supine: 4: Min assist Details for Bed Mobility Assistance: Assist for R LE off/onto bed. vcs safety, technique, hand placement Transfers Transfers: Sit to Stand;Stand to Sit Sit to Stand: 4: Min guard Stand to Sit: 4: Min guard Details for Transfer Assistance: verbal cues for hand placement each transfer Ambulation/Gait Ambulation/Gait Assistance: 4: Min guard Ambulation Distance (Feet): 160 Feet Assistive device: Rolling walker Ambulation/Gait Assistance Details: pt wore shoes this session Gait Pattern: Step-through pattern;Antalgic;Decreased stride length    Exercises Total Joint Exercises Ankle Circles/Pumps: AROM;Both;10 reps;Seated Quad Sets: AROM;Both;10 reps;Seated Short Arc Quad: AROM;Right;10 reps;Seated Heel Slides: AAROM;Right;10 reps;Seated Hip ABduction/ADduction: AAROM;Right;10 reps;Seated;AROM   PT Diagnosis:    PT Problem List:   PT Treatment Interventions:     PT Goals Acute Rehab PT Goals Pt will go Supine/Side to  Sit: with supervision PT Goal: Supine/Side to Sit - Progress: Progressing toward goal Pt will go Sit to Supine/Side: with supervision PT Goal: Sit to Supine/Side - Progress: Progressing toward goal Pt will go Sit to Stand: with supervision PT Goal: Sit to Stand - Progress: Progressing toward goal Pt will Ambulate: 51 - 150 feet;with supervision;with rolling walker PT Goal: Ambulate - Progress: Progressing toward goal Pt will Perform Home Exercise Program: with supervision, verbal cues required/provided PT Goal: Perform Home Exercise Program - Progress: Progressing toward goal  Visit Information  Last PT Received On: 03/06/13 Assistance Needed: +1    Subjective Data  Subjective: Im more sore today Patient Stated Goal: rehab   Cognition  Cognition Arousal/Alertness: Awake/alert Behavior During Therapy: WFL for tasks assessed/performed Overall Cognitive Status: Within Functional Limits for tasks assessed    Balance     End of Session PT - End of Session Activity Tolerance: Patient tolerated treatment well Patient left: in bed;with call bell/phone within reach   GP     [name], MPT Pager: [phone]

## 2013-03-06 NOTE — Progress Notes (Signed)
Physical Therapy Treatment Patient Details Name: Brianna [name] MRN: [id number] DOB: 1925-02-10 Today's Date: 03/06/2013 Time: 0940-1003 PT Time Calculation (min): 23 min  PT Assessment / Plan / Recommendation Comments on Treatment Session  Progressing with mobility. Pt preferred to ambulate in shoes.Plan is for SNF tomorrow.     Follow Up Recommendations  SNF     Does the patient have the potential to tolerate intense rehabilitation     Barriers to Discharge        Equipment Recommendations  Rolling walker with 5" wheels    Recommendations for Other Services    Frequency 7X/week   Plan Discharge plan remains appropriate    Precautions / Restrictions Precautions Precautions: Fall Restrictions Weight Bearing Restrictions: No RLE Weight Bearing: Weight bearing as tolerated   Pertinent Vitals/Pain 5/10 R hip    Mobility  Bed Mobility Bed Mobility: Supine to Sit Supine to Sit: 4: Min assist Details for Bed Mobility Assistance: Assist for R LE off/onto bed. vcs safety, technique, hand placement Transfers Transfers: Sit to Stand;Stand to Sit Sit to Stand: 4: Min guard;From bed;From toilet Stand to Sit: 4: Min guard;To chair/3-in-1;To toilet Details for Transfer Assistance: verbal cues for hand placement each transfer Ambulation/Gait Ambulation/Gait Assistance: 4: Min guard Ambulation Distance (Feet): 145 Feet Assistive device: Rolling walker Ambulation/Gait Assistance Details: pt wore shoes this session Gait Pattern: Step-through pattern;Antalgic;Decreased stride length    Exercises Total Joint Exercises Ankle Circles/Pumps: AROM;Both;10 reps;Seated Quad Sets: AROM;Both;10 reps;Seated Short Arc Quad: AROM;Right;10 reps;Seated Heel Slides: AAROM;Right;10 reps;Seated Hip ABduction/ADduction: AAROM;Right;10 reps;Seated;AROM   PT Diagnosis:    PT Problem List:   PT Treatment Interventions:     PT Goals Acute Rehab PT Goals Pt will go Supine/Side to Sit: with  supervision PT Goal: Supine/Side to Sit - Progress: Progressing toward goal Pt will go Sit to Stand: with supervision PT Goal: Sit to Stand - Progress: Progressing toward goal Pt will Ambulate: 51 - 150 feet;with supervision;with rolling walker PT Goal: Ambulate - Progress: Progressing toward goal Pt will Perform Home Exercise Program: with supervision, verbal cues required/provided PT Goal: Perform Home Exercise Program - Progress: Progressing toward goal  Visit Information  Last PT Received On: 03/06/13 Assistance Needed: +1    Subjective Data  Subjective: Im more sore today Patient Stated Goal: rehab   Cognition  Cognition Arousal/Alertness: Awake/alert Behavior During Therapy: WFL for tasks assessed/performed Overall Cognitive Status: Within Functional Limits for tasks assessed    Balance     End of Session PT - End of Session Activity Tolerance: Patient tolerated treatment well Patient left: in chair;with call bell/phone within reach   GP     [name], MPT Pager: [phone]

## 2013-03-06 NOTE — Progress Notes (Signed)
   Subjective: 2 Days Post-Op Procedure(s) (LRB): RIGHT TOTAL HIP ARTHROPLASTY ANTERIOR APPROACH (Right)   Patient reports pain as mild, pain well controlled. No events throughout the night.   Objective:   VITALS:   Filed Vitals:   03/06/13 0526  BP: 136/66  Pulse: 89  Temp: 98.5 F (36.9 C)  Resp: 16    Neurovascular intact Dorsiflexion/Plantar flexion intact Incision: dressing C/D/I No cellulitis present Compartment soft  LABS  Recent Labs  03/05/13 0503 03/06/13 0508  HGB 9.1* 9.5*  HCT 28.1* 30.0*  WBC 6.9 9.3  PLT 192 199     Recent Labs  03/05/13 0503 03/06/13 0508  NA 135 135  K 4.4 4.6  BUN 17 21  CREATININE 0.83 0.95  GLUCOSE 99 140*     Assessment/Plan: 2 Days Post-Op Procedure(s) (LRB): RIGHT TOTAL HIP ARTHROPLASTY ANTERIOR APPROACH (Right) Change Norco to tramadol Up with therapy Discharge to SNF [facility]) eventually, when ready  Expected ABLA  Treated with iron and will observe       Anastasio Auerbach. Sheree Lalla   PAC  03/06/2013, 8:32 AM

## 2013-03-06 NOTE — Care Management Note (Signed)
    Page 1 of 1   03/06/2013     3:28:02 PM   CARE MANAGEMENT NOTE 03/06/2013  Patient:  ELVERNA, CAFFEE   Account Number:  [id number]  Date Initiated:  03/06/2013  Documentation initiated by:  Colleen Can  Subjective/Objective Assessment:   dx right hip osteoarthritis; total hip replacemnt -anterior approach     Action/Plan:   SNF rehab   Anticipated DC Date:  03/07/2013   Anticipated DC Plan:  SKILLED NURSING FACILITY  In-house referral  Clinical Social Worker      DC Planning Services  CM consult      Choice offered to / List presented to:             Status of service:  Completed, signed off Medicare Important Message given?  NA - LOS <3 / Initial given by admissions (If response is "NO", the following Medicare IM given date fields will be blank) Date Medicare IM given:   Date Additional Medicare IM given:    Discharge Disposition:    Per UR Regulation:    If discussed at Long Length of Stay Meetings, dates discussed:    Comments:

## 2013-03-07 LAB — BASIC METABOLIC PANEL
BUN: 18 mg/dL (ref 6–23)
Chloride: 101 mEq/L (ref 96–112)
Creatinine, Ser: 0.88 mg/dL (ref 0.50–1.10)
GFR calc non Af Amer: 58 mL/min — ABNORMAL LOW (ref >90)
Glucose, Bld: 94 mg/dL (ref 70–99)
Potassium: 4.2 mEq/L (ref 3.5–5.1)

## 2013-03-07 MED ORDER — TRAMADOL HCL 50 MG PO TABS: 50.0000 mg | ORAL_TABLET | Freq: Four times a day (QID) | ORAL | Status: DC | PRN

## 2013-03-07 MED ORDER — ASPIRIN EC 325 MG PO TBEC: 325.0000 mg | DELAYED_RELEASE_TABLET | Freq: Two times a day (BID) | ORAL | Status: DC

## 2013-03-07 MED ORDER — POLYETHYLENE GLYCOL 3350 17 G PO PACK: 17.0000 g | PACK | Freq: Two times a day (BID) | ORAL | Status: DC

## 2013-03-07 MED ORDER — DSS 100 MG PO CAPS: 100.0000 mg | ORAL_CAPSULE | Freq: Two times a day (BID) | ORAL | Status: DC

## 2013-03-07 MED ORDER — FERROUS SULFATE 325 (65 FE) MG PO TABS: 325.0000 mg | ORAL_TABLET | Freq: Three times a day (TID) | ORAL | Status: DC

## 2013-03-07 MED ORDER — METHOCARBAMOL 500 MG PO TABS: 500.0000 mg | ORAL_TABLET | Freq: Four times a day (QID) | ORAL | Status: DC | PRN

## 2013-03-07 NOTE — Discharge Summary (Signed)
Physician Discharge Summary  Patient ID: Brianna [name] MRN: [id number] DOB/AGE: [birth year] 79 y.o.  Admit date: 03/04/2013 Discharge date:  03/07/2013  Procedures:  Procedure(s) (LRB): RIGHT TOTAL HIP ARTHROPLASTY ANTERIOR APPROACH (Right)  Attending Physician:  Dr. Durene Romans   Admission Diagnoses:   Right hip OA / pain  Discharge Diagnoses:  Principal Problem:   S/P right THA, AA Active Problems:   Expected blood loss anemia  Past Medical History  Diagnosis Date  . Mixed hyperlipidemia   . Lumbosacral spondylosis without myelopathy   . Fibrocystic breast   . Osteoporosis   . Symptomatic menopausal or female climacteric states   . Colon polyp   . Essential hypertension, benign   . Rectosigmoid cancer     T1N0, 2004  . PONV (postoperative nausea and vomiting)   . COPD (chronic obstructive pulmonary disease)   . Hypothyroidism   . Ventral hernia   . Bilateral inguinal hernia (BIH)   . Obturator hernia, right   . MVP (mitral valve prolapse)     PER 2D ECHO REPORT  . Carpal tunnel syndrome on right   . Frequency of urination   . History of skin cancer     HPI: Brianna [name], 79 y.o. female, has a history of pain and functional disability in the right hip(s) due to arthritis and patient has failed non-surgical conservative treatments for greater than 12 weeks to include NSAID's and/or analgesics, corticosteriod injections, use of assistive devices and activity modification. Onset of symptoms was gradual starting 2 years ago with rapidlly worsening course since that time.The patient noted no past surgery on the right hip(s). Patient currently rates pain in the right hip at 10 out of 10 with activity. Patient has night pain, worsening of pain with activity and weight bearing, trendelenberg gait, pain that interfers with activities of daily living and pain with passive range of motion. Patient has evidence of periarticular osteophytes and joint space narrowing by imaging  studies. This condition presents safety issues increasing the risk of falls. There is no current active infection. Risks, benefits and expectations were discussed with the patient. Patient understand the risks, benefits and expectations and wishes to proceed with surgery.   PCP: Rudi Heap, MD   Discharged Condition: good  Hospital Course:  Patient underwent the above stated procedure on 03/04/2013. Patient tolerated the procedure well and brought to the recovery room in good condition and subsequently to the floor.  POD #1 BP: 128/84 ; Pulse: 99 ; Temp: 97.7 F (36.5 C) ; Resp: 16  Pt's foley was removed, as well as the hemovac drain removed. IV was changed to a saline lock. Patient reports pain as mild, pain well controlled. No events throughout the night. Neurovascular intact, dorsiflexion/plantar flexion intact, incision: dressing C/D/I, no cellulitis present and compartment soft.   LABS  Basename  03/05/13    0503   HGB  9.1  HCT  28.1   POD #2  BP: 136/66 ; Pulse: 89 ; Temp: 98.5 F (36.9 C) ; Resp: 16  Patient reports pain as mild, pain well controlled. No events throughout the night.  Neurovascular intact, dorsiflexion/plantar flexion intact, incision: dressing C/D/I, no cellulitis present and compartment soft.   LABS  Basename  03/06/13    0508  HGB  9.5  HCT  30.0   POD #3  BP: 148/70 ; Pulse: 92 ; Temp: 98 F (36.7 C) ; Resp: 16  Patient reports pain as mild, pain well controlled. Complaining of some  soreness, but not too bad. No events throughout the night. Ready to be discharged to SNF. Neurovascular intact, dorsiflexion/plantar flexion intact, incision: dressing C/D/I, no cellulitis present and compartment soft.   LABS   No new labs  Discharge Exam: General appearance: alert, cooperative and no distress Extremities: Homans sign is negative, no sign of DVT, no edema, redness or tenderness in the calves or thighs and no ulcers, gangrene or trophic  changes  Disposition: SNF  with follow up in 2 weeks   Follow-up Information   Follow up with Shelda Pal, MD. Schedule an appointment as soon as possible for a visit in 2 weeks.   Contact information:   [address] [place] [address] [phone]       Discharge Orders   Future Appointments Provider Department Dept Phone   05/15/2013 10:00 AM Ernestina Penna, MD [facility] [phone]   05/27/2013 1:00 PM Jonelle Sidle, MD [address] (near [place]) [phone]   Future Orders Complete By Expires     Call MD / Call 911  As directed     Comments:      If you experience chest pain or shortness of breath, CALL 911 and be transported to the hospital emergency room.  If you develope a fever above 101 F, pus (white drainage) or increased drainage or redness at the wound, or calf pain, call your surgeon's office.    Change dressing  As directed     Comments:      Maintain surgical dressing for 10-14 days, then replace with 4x4 guaze and tape. Keep the area dry and clean.    Constipation Prevention  As directed     Comments:      Drink plenty of fluids.  Prune juice may be helpful.  You may use a stool softener, such as Colace (over the counter) 100 mg twice a day.  Use MiraLax (over the counter) for constipation as needed.    Diet - low sodium heart healthy  As directed     Discharge instructions  As directed     Comments:      Maintain surgical dressing for 10-14 days, then replace with gauze and tape. Keep the area dry and clean until follow up. Follow up in 2 weeks at [facility]. Call with any questions or concerns.    Increase activity slowly as tolerated  As directed     TED hose  As directed     Comments:      Use stockings (TED hose) for 2 weeks on both leg(s).  You may remove them at night for sleeping.    Weight bearing as tolerated  As directed          Medication List    STOP taking these  medications       ASPERCREME 10 % Lotn  Generic drug:  Trolamine Salicylate     diphenhydramine-acetaminophen 25-500 MG Tabs  Commonly known as:  TYLENOL PM     meloxicam 15 MG tablet  Commonly known as:  MOBIC      TAKE these medications       acetaminophen 500 MG tablet  Commonly known as:  TYLENOL  Take 500 mg by mouth every 6 (six) hours as needed. Pain       aspirin EC 325 MG tablet  Take 1 tablet (325 mg total) by mouth 2 (two) times daily.     atorvastatin 10 MG tablet  Commonly known as:  LIPITOR  Take 5 mg by mouth at bedtime. Takes 1/2 tablet     beta carotene w/minerals tablet  Take 1 tablet by mouth daily.     CITRACAL + D PO  Take 2 tablets by mouth daily.     DSS 100 MG Caps  Take 100 mg by mouth 2 (two) times daily.     ferrous sulfate 325 (65 FE) MG tablet  Take 1 tablet (325 mg total) by mouth 3 (three) times daily after meals.     Fish Oil Oil  Take 5 mLs by mouth daily.     hydrochlorothiazide 25 MG tablet  Commonly known as:  HYDRODIURIL  Take 12.5 mg by mouth daily before breakfast. Takes 1/2 tablet     levothyroxine 25 MCG tablet  Commonly known as:  SYNTHROID, LEVOTHROID  Take 25 mcg by mouth daily before breakfast.     methocarbamol 500 MG tablet  Commonly known as:  ROBAXIN  Take 1 tablet (500 mg total) by mouth every 6 (six) hours as needed (muscle spasms).     polyethylene glycol packet  Commonly known as:  MIRALAX / GLYCOLAX  Take 17 g by mouth 2 (two) times daily.     traMADol 50 MG tablet  Commonly known as:  ULTRAM  Take 1-2 tablets (50-100 mg total) by mouth every 6 (six) hours as needed for pain.     valsartan 320 MG tablet  Commonly known as:  DIOVAN  Take 160 mg by mouth daily before breakfast. Takes 1/2     Vitamin D3 2000 UNITS Tabs  Take 1 tablet by mouth daily.         Signed: Anastasio Auerbach. Jomes Giraldo   PAC  03/07/2013, 8:20 AM

## 2013-03-07 NOTE — Progress Notes (Signed)
   Subjective: 3 Days Post-Op Procedure(s) (LRB): RIGHT TOTAL HIP ARTHROPLASTY ANTERIOR APPROACH (Right)   Patient reports pain as mild, pain well controlled. Complaining of some soreness, but not too bad. No events throughout the night. Ready to be discharged to SNF.  Objective:   VITALS:   Filed Vitals:   03/07/13 0643  BP: 148/70  Pulse: 92  Temp: 98 F (36.7 C)  Resp: 16    Neurovascular intact Dorsiflexion/Plantar flexion intact Incision: dressing C/D/I No cellulitis present Compartment soft  LABS  Recent Labs  03/05/13 0503 03/06/13 0508  HGB 9.1* 9.5*  HCT 28.1* 30.0*  WBC 6.9 9.3  PLT 192 199     Recent Labs  03/05/13 0503 03/06/13 0508 03/07/13 0505  NA 135 135 136  K 4.4 4.6 4.2  BUN 17 21 18   CREATININE 0.83 0.95 0.88  GLUCOSE 99 140* 94     Assessment/Plan: 3 Days Post-Op Procedure(s) (LRB): RIGHT TOTAL HIP ARTHROPLASTY ANTERIOR APPROACH (Right) Up with therapy Discharge to SNF ([facility]) Follow up in 2 weeks at [facility]. Follow up with OLIN,Eriana Suliman D in 2 weeks.  Contact information:  [facility] [address], [address] [place] [address] [phone]       Anastasio Auerbach. Trygg Mantz   PAC  03/07/2013, 7:46 AM

## 2013-03-07 NOTE — Progress Notes (Signed)
Clinical Social Work Department CLINICAL SOCIAL WORK PLACEMENT NOTE 03/07/2013  Patient:  Brianna [name], Brianna [name]  Account Number:  [id number] Admit date:  03/04/2013  Clinical Social Worker:  Cori Razor, LCSW  Date/time:  03/04/2013 03:37 PM  Clinical Social Work is seeking post-discharge placement for this patient at the following level of care:   SKILLED NURSING   (*CSW will update this form in Epic as items are completed)     Patient/family provided with [facility] Department of Clinical Social Work's list of facilities offering this level of care within the geographic area requested by the patient (or if unable, by the patient's family).  03/04/2013  Patient/family informed of their freedom to choose among providers that offer the needed level of care, that participate in Medicare, Medicaid or managed care program needed by the patient, have an available bed and are willing to accept the patient.  03/04/2013  Patient/family informed of [facility]' ownership interest in [facility], as well as of the fact that they are under no obligation to receive care at this facility.  PASARR submitted to EDS on 03/04/2013 PASARR number received from EDS on 03/04/2013  FL2 transmitted to all facilities in geographic area requested by pt/family on  03/04/2013 FL2 transmitted to all facilities within larger geographic area on   Patient informed that his/her managed care company has contracts with or will negotiate with  certain facilities, including the following:     Patient/family informed of bed offers received:  03/05/2013 Patient chooses bed at [facility] SNF Physician recommends and patient chooses bed at    Patient to be transferred to [facility] SNF on  03/07/2013 Patient to be transferred to facility by FAMILY  The following physician request were entered in Epic:   Additional Comments:   Cori Razor LCSW [phone]

## 2013-03-07 NOTE — Progress Notes (Signed)
Physical Therapy Treatment Patient Details Name: Brianna [name] MRN: [id number] DOB: [birth year] Today's Date: 03/07/2013 Time: 0850-0906 PT Time Calculation (min): 16 min  PT Assessment / Plan / Recommendation Comments on Treatment Session  Pt questioning whether she should take ambulance or have daughter drive her. Explained to pt that she is moving around well enough to ride in car if she so chose to. Pt then stated daughter is worried/nervous that something will go wrong if she drives her. Recommended pt choose option she is most comfortable with. Recommend SNf    Follow Up Recommendations  SNF     Does the patient have the potential to tolerate intense rehabilitation     Barriers to Discharge        Equipment Recommendations  Rolling walker with 5" wheels    Recommendations for Other Services OT consult  Frequency 7X/week   Plan Discharge plan remains appropriate    Precautions / Restrictions Precautions Precautions: Fall Restrictions Weight Bearing Restrictions: No RLE Weight Bearing: Weight bearing as tolerated   Pertinent Vitals/Pain 3/10 R hip/thigh area    Mobility  Bed Mobility Bed Mobility: Supine to Sit Supine to Sit: 4: Min assist Details for Bed Mobility Assistance: Assist for R LE off bed.  Transfers Transfers: Sit to Stand;Stand to Sit Sit to Stand: 5: Supervision;From bed;From toilet Stand to Sit: 5: Supervision;To chair/3-in-1;To toilet Details for Transfer Assistance: verbal cues for hand placement each transfer Ambulation/Gait Ambulation/Gait Assistance: 4: Min guard Ambulation Distance (Feet): 175 Feet Assistive device: Rolling walker Ambulation/Gait Assistance Details: Pt continues to report R LE feels longer Gait Pattern: Decreased stride length    Exercises Total Joint Exercises Heel Slides: AROM;Right;10 reps;Standing Knee Flexion: AROM;Right;10 reps;Standing Marching in Standing: AROM;Right;10 reps;Standing General Exercises - Lower  Extremity Heel Raises: AROM;Both;10 reps;Standing   PT Diagnosis:    PT Problem List:   PT Treatment Interventions:     PT Goals Acute Rehab PT Goals Pt will go Supine/Side to Sit: with supervision PT Goal: Supine/Side to Sit - Progress: Progressing toward goal Pt will go Sit to Stand: with supervision PT Goal: Sit to Stand - Progress: Met Pt will Ambulate: 51 - 150 feet;with supervision;with rolling walker PT Goal: Ambulate - Progress: Progressing toward goal Pt will Perform Home Exercise Program: with supervision, verbal cues required/provided PT Goal: Perform Home Exercise Program - Progress: Progressing toward goal  Visit Information  Last PT Received On: 03/07/13    Subjective Data  Subjective: Im sore Patient Stated Goal: rehab   Cognition  Cognition Arousal/Alertness: Awake/alert Behavior During Therapy: WFL for tasks assessed/performed Overall Cognitive Status: Within Functional Limits for tasks assessed    Balance     End of Session PT - End of Session Activity Tolerance: Patient tolerated treatment well Patient left: in chair;with call bell/phone within reach   GP     [name], MPT Pager: [phone]

## 2013-03-13 ENCOUNTER — Telehealth: Payer: Self-pay | Admitting: Family Medicine

## 2013-03-13 NOTE — Telephone Encounter (Signed)
CALLED [facility] WHO CALLED TO MAKE APPT FOR MS. [place]. THEY WILL FIND OUT AND HAVE THEM CALL us BACK FOR APPT.

## 2013-03-13 NOTE — Telephone Encounter (Signed)
Please advise 

## 2013-03-25 ENCOUNTER — Encounter: Payer: Self-pay | Admitting: Family Medicine

## 2013-03-25 ENCOUNTER — Ambulatory Visit (INDEPENDENT_AMBULATORY_CARE_PROVIDER_SITE_OTHER): Payer: Medicare Other | Admitting: Family Medicine

## 2013-03-25 VITALS — BP 133/70 | HR 83 | Temp 97.5°F | Ht 61.0 in | Wt 110.4 lb

## 2013-03-25 DIAGNOSIS — D649 Anemia, unspecified: Secondary | ICD-10-CM

## 2013-03-25 DIAGNOSIS — E785 Hyperlipidemia, unspecified: Secondary | ICD-10-CM | POA: Insufficient documentation

## 2013-03-25 DIAGNOSIS — I10 Essential (primary) hypertension: Secondary | ICD-10-CM | POA: Insufficient documentation

## 2013-03-25 LAB — HEPATIC FUNCTION PANEL
Albumin: 3.5 g/dL (ref 3.5–5.2)
Alkaline Phosphatase: 84 U/L (ref 39–117)
Total Protein: 6.3 g/dL (ref 6.0–8.3)

## 2013-03-25 LAB — BASIC METABOLIC PANEL WITH GFR
Chloride: 98 mEq/L (ref 96–112)
Creat: 0.88 mg/dL (ref 0.50–1.10)
GFR, Est Non African American: 60 mL/min

## 2013-03-25 LAB — POCT CBC
Granulocyte percent: 55.5 %G (ref 37–80)
HCT, POC: 31.8 % — AB (ref 37.7–47.9)
Hemoglobin: 10.6 g/dL — AB (ref 12.2–16.2)
MCV: 90.3 fL (ref 80–97)
Platelet Count, POC: 327 10*3/uL (ref 142–424)
RBC: 3.5 M/uL — AB (ref 4.04–5.48)

## 2013-03-25 LAB — FERRITIN: Ferritin: 164 ng/mL (ref 10–291)

## 2013-03-25 LAB — LIPID PANEL
HDL: 63 mg/dL (ref >39)
LDL Cholesterol: 92 mg/dL (ref 0–99)
Triglycerides: 60 mg/dL (ref <150)
VLDL: 12 mg/dL (ref 0–40)

## 2013-03-25 NOTE — Patient Instructions (Signed)
Continue current medications. Continue current physical therapy. May reduce aspirin to 325 mg one daily. Discussed with Dr. Charlann Boxer about reducing that further upon his next visit. Continue to be careful at home and not fall We will call as soon as lab results are available.

## 2013-03-25 NOTE — Progress Notes (Signed)
Subjective:    Patient ID: Brianna [name], female    DOB: 28-Mar-1925, 79 y.o.   MRN: [id number]  HPI Patient returns to clinic today following her right anterior approach to hip replacement. She is cheerful and currently receiving physical therapy at home. She will see Dr. Charlann Boxer again in 3 weeks. She is still taking an aspirin 325 mg one daily. She is also on one iron by mouth daily.   Review of Systems  Constitutional: Negative for fatigue.  Respiratory: Negative for shortness of breath.   Musculoskeletal: Positive for arthralgias (slight R hip post replacement, continued weakness).       Objective:   Physical Exam BP 133/70  Pulse 83  Temp(Src) 97.5 F (36.4 C) (Oral)  Ht 5\' 1"  (1.549 m)  Wt 110 lb 6.4 oz (50.077 kg)  BMI 20.87 kg/m2  The patient appeared well nourished and normally developed, alert and oriented to time and place. Speech, behavior and judgement appear normal. Vital signs as documented.  Head exam is unremarkable. No scleral icterus or pallor noted. Slight nasal congestion bilaterally.  Neck is without jugular venous distension, thyromegally, or carotid bruits. Carotid upstrokes are brisk bilaterally. No cervical adenopathy. Lungs are clear anteriorly and posteriorly to auscultation. Normal respiratory effort. Cardiac exam reveals slightly irregular rate and rhythm at 72 per minute. First and second heart sounds normal.  No murmurs, rubs or gallops.  Abdominal exam reveals normal bowl sounds, no masses, no organomegaly and no aortic enlargement. No inguinal adenopathy. Extremities are nonedematous and both femoral and pedal pulses are normal. She is somewhat hesitant with her movements but has full range of motion of all her extremities. There is no significant pain other than just some soreness in the right hip. Skin without pallor or jaundice.  Warm and dry, without rash. Neurologic exam reveals normal deep tendon reflexes and normal sensation.  Results for  orders placed in visit on 03/25/13  POCT CBC      Result Value Range   WBC 3.7 (*) 4.6 - 10.2 K/uL   Lymph, poc 1.2  0.6 - 3.4   POC LYMPH PERCENT 31.6  10 - 50 %L   POC Granulocyte 2.1  2 - 6.9   Granulocyte percent 55.5  37 - 80 %G   RBC 3.5 (*) 4.04 - 5.48 M/uL   Hemoglobin 10.6 (*) 12.2 - 16.2 g/dL   HCT, POC 84.6 (*) 96.2 - 47.9 %   MCV 90.3  80 - 97 fL   MCH, POC 30.1  27 - 31.2 pg   MCHC 33.4  31.8 - 35.4 g/dL   RDW, POC 95.2     Platelet Count, POC 327.0  142 - 424 K/uL   MPV 6.9  0 - 99.8 fL            Assessment & Plan:   1. Anemia - POCT CBC - Ferritin  2. Hyperlipidemia - Lipid panel - Hepatic function panel  3. Hypertension - BASIC METABOLIC PANEL WITH GFR        Patient Instructions  Continue current medications. Continue current physical therapy. May reduce aspirin to 325 mg one daily. Discussed with Dr. Charlann Boxer about reducing that further upon his next visit. Continue to be careful at home and not fall We will call as soon as lab results are available.   For now continue iron daily, may need occasional  extra 1 for example on Monday Wednesday and Friday We will recheck a CBC in about 4-6 weeks

## 2013-03-30 IMAGING — CR DG CHEST 2V
2 series · 2 of 2 positions shown · non-contrast
Comparison: None.

CLINICAL DATA: Preoperative evaluation.  History of irregular heart
rate.  History of controlled hypertension.

CHEST - 2 VIEW

[w chest pa]
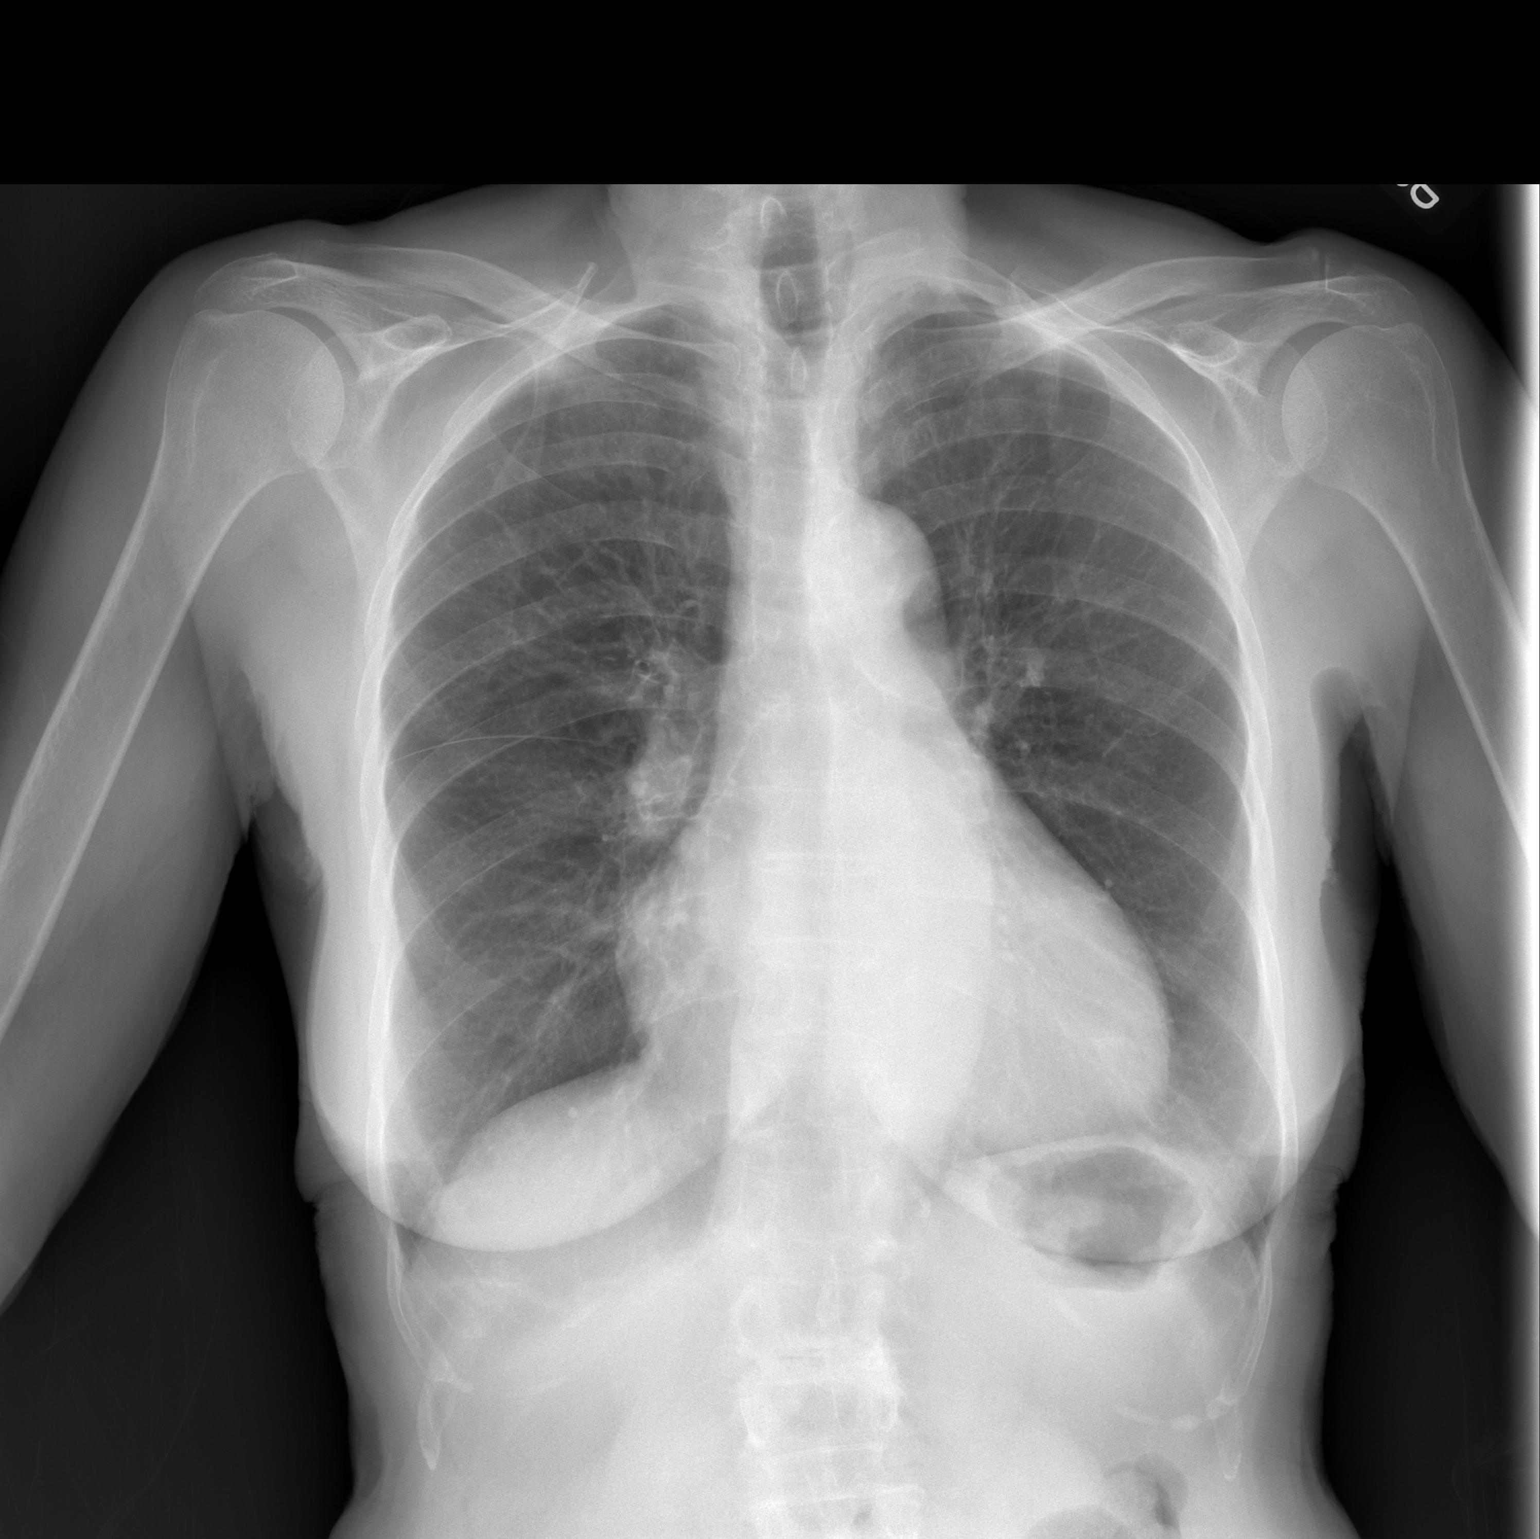

[w chest lat]
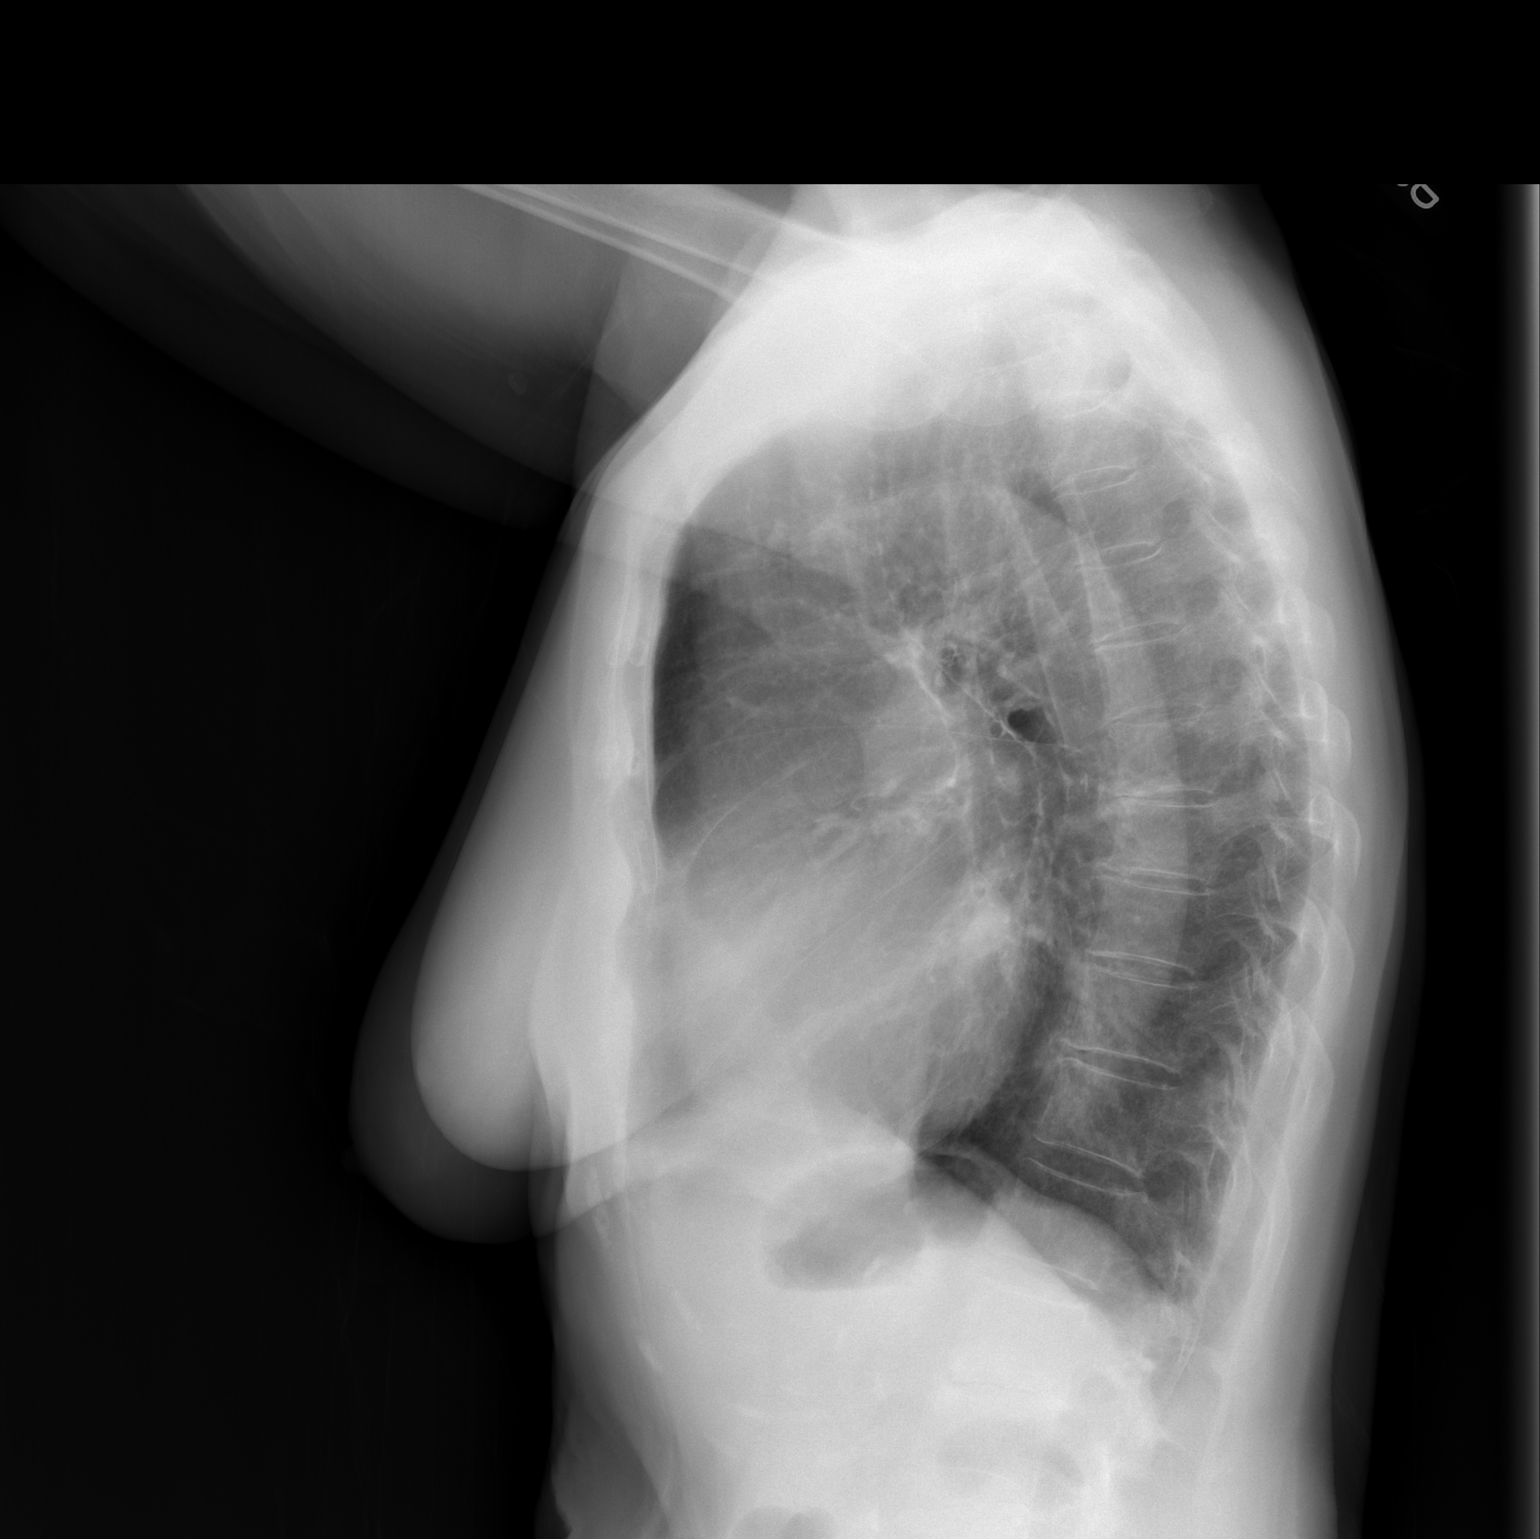

[2 of 2 positions shown; findings below may reference images not displayed]

FINDINGS: There is moderate cardiac silhouette enlargement.  There
is tortuosity of descending aorta with ectasia.  There is slight
flattening of the diaphragm on lateral image.  This suggest slight
hyperinflation configuration.  No pulmonary infiltrates or nodules
are seen. No pleural abnormality is evident. There is a mildly
osteopenic appearance of the bones..  Changes of degenerative disc
disease and degenerative spondylosis are seen.
IMPRESSION: Moderate cardiac silhouette enlargement.  Slight hyperinflation
configuration.  No pulmonary edema or pulmonary infiltrates are
seen.

## 2013-04-21 ENCOUNTER — Other Ambulatory Visit (INDEPENDENT_AMBULATORY_CARE_PROVIDER_SITE_OTHER): Payer: Medicare Other

## 2013-04-21 DIAGNOSIS — R7989 Other specified abnormal findings of blood chemistry: Secondary | ICD-10-CM

## 2013-04-21 LAB — POCT CBC
HCT, POC: 35 % — AB (ref 37.7–47.9)
Hemoglobin: 11.8 g/dL — AB (ref 12.2–16.2)
MCH, POC: 30.8 pg (ref 27–31.2)
MCHC: 33.7 g/dL (ref 31.8–35.4)
MCV: 91.3 fL (ref 80–97)
POC LYMPH PERCENT: 35.7 %L (ref 10–50)

## 2013-05-15 ENCOUNTER — Ambulatory Visit: Payer: Self-pay | Admitting: Family Medicine

## 2013-05-27 ENCOUNTER — Ambulatory Visit (INDEPENDENT_AMBULATORY_CARE_PROVIDER_SITE_OTHER): Payer: Medicare Other | Admitting: Cardiology

## 2013-05-27 ENCOUNTER — Encounter: Payer: Self-pay | Admitting: Cardiology

## 2013-05-27 VITALS — BP 133/68 | HR 79 | Ht 61.0 in | Wt 113.0 lb

## 2013-05-27 DIAGNOSIS — I10 Essential (primary) hypertension: Secondary | ICD-10-CM

## 2013-05-27 DIAGNOSIS — E782 Mixed hyperlipidemia: Secondary | ICD-10-CM

## 2013-05-27 DIAGNOSIS — I059 Rheumatic mitral valve disease, unspecified: Secondary | ICD-10-CM

## 2013-05-27 NOTE — Assessment & Plan Note (Signed)
Continues on Lipitor. Keep follow up with Dr. Christell Constant.

## 2013-05-27 NOTE — Assessment & Plan Note (Signed)
Myxomatous valve with mild prolapse associated with moderate to severe eccentric mitral regurgitation. PASP only 32 mmHg. LV size and function are normal. At this point we plan observation and conservative management. Followup arranged.

## 2013-05-27 NOTE — Assessment & Plan Note (Signed)
No change to current regimen, presently on ARB.

## 2013-05-27 NOTE — Patient Instructions (Signed)
Your physician recommends that you schedule a follow-up appointment in: 6 months with Dr. Diona Browner you should receive a letter in mail in 4 months (November 2014). If you do not receive this letter call our office around November 2014 to schedule this appointment.   Continue your current medications. Refer to the list given to you today.

## 2013-05-27 NOTE — Progress Notes (Signed)
Clinical Summary Ms. Titzer is an 79 y.o.female last seen in April for preoperative evaluation prior to right total hip arthroplasty. She ultimately underwent surgery in May without obvious cardiac complications. She states that she has been doing well, back to her typical activities. No progressive breathlessness, no chest pain or palpitations. States that her head feels much better.  Echocardiogram from April of this year demonstrated mild LVH with LVEF 60-65%, grade 1 diastolic dysfunction, mildly calcified aortic valve with mild aortic regurgitation, myxomatous mitral valve with mild prolapse and moderate to severe eccentric mitral regurgitation, moderate left atrial enlargement, mild right atrial enlargement, mild tricuspid regurgitation with PASP 32 mmHg.  I discussed the mitral valve findings with the patient and reviewed the implications. Fortunately, she is not particularly symptomatic at this time. Obviously a major cardiac operation to repair her valve would be risky at her age. We are electing to follow her conservatively at this point in time.   Allergies  Allergen Reactions  . Alendronate Sodium Nausea Only  . Lodine (Etodolac)     unknown  . Risedronate Sodium     unknown    Current Outpatient Prescriptions  Medication Sig Dispense Refill  . acetaminophen (TYLENOL) 500 MG tablet Take 500 mg by mouth every 6 (six) hours as needed. Pain        . aspirin 81 MG tablet Take 81 mg by mouth 3 (three) times a week.      Marland Kitchen atorvastatin (LIPITOR) 10 MG tablet Take 5 mg by mouth at bedtime. Takes 1/2 tablet      . beta carotene w/minerals (OCUVITE) tablet Take 1 tablet by mouth daily.        . Calcium Citrate-Vitamin D (CITRACAL + D PO) Take 2 tablets by mouth daily.       . Cholecalciferol (VITAMIN D3) 2000 UNITS TABS Take 1 tablet by mouth daily.       Tery Sanfilippo Sodium (DSS) 100 MG CAPS Take 100 mg by mouth daily.      . ferrous sulfate 325 (65 FE) MG tablet Take 325 mg by  mouth daily.      . Fish Oil OIL Take 5 mLs by mouth daily.      Marland Kitchen levothyroxine (SYNTHROID, LEVOTHROID) 25 MCG tablet Take 50 mcg by mouth daily before breakfast.       . losartan (COZAAR) 100 MG tablet Take 100 mg by mouth daily.       No current facility-administered medications for this visit.    Past Medical History  Diagnosis Date  . Mixed hyperlipidemia   . Lumbosacral spondylosis without myelopathy   . Fibrocystic breast   . Osteoporosis   . Symptomatic menopausal or female climacteric states   . Colon polyp   . Essential hypertension, benign   . Rectosigmoid cancer     T1N0, 2004  . PONV (postoperative nausea and vomiting)   . COPD (chronic obstructive pulmonary disease)   . Hypothyroidism   . Ventral hernia   . Bilateral inguinal hernia (BIH)   . Obturator hernia, right   . Myxomatous mitral valve     Mild prolapse with eccentric, moderate to severe regurgitation  . Carpal tunnel syndrome on right   . Frequency of urination   . History of skin cancer     Social History Ms. Peery reports that she has never smoked. She does not have any smokeless tobacco history on file. Ms. Fettes reports that she does not drink alcohol.  Review of Systems  Negative except as outlined.  Physical Examination Filed Vitals:   05/27/13 1254  BP: 133/68  Pulse: 79   Filed Weights   05/27/13 1254  Weight: 113 lb (51.256 kg)    Patient in no acute distress, appears somewhat younger than stated age.  HEENT: Conjunctiva and lids normal, oropharynx clear.  Neck: Supple, no elevated JVP or carotid bruits, no thyromegaly.  Lungs: Clear to auscultation, nonlabored breathing at rest.  Cardiac: Regular rate and rhythm, no S3, mid systolic murmur after click heard in mid systole mainly in the lateral thorax and even into the back , no pericardial rub.  Abdomen: Soft, nontender, bowel sounds present.  Extremities: No pitting edema, distal pulses 2+.    Problem List and Plan    Mitral valve disease Myxomatous valve with mild prolapse associated with moderate to severe eccentric mitral regurgitation. PASP only 32 mmHg. LV size and function are normal. At this point we plan observation and conservative management. Followup arranged.  Hypertension No change to current regimen, presently on ARB.  Mixed hyperlipidemia Continues on Lipitor. Keep follow up with Dr. Christell Constant.    Jonelle Sidle, M.D., F.A.C.C.

## 2013-06-24 ENCOUNTER — Telehealth: Payer: Self-pay | Admitting: Family Medicine

## 2013-06-26 MED ORDER — LEVOTHYROXINE SODIUM 25 MCG PO TABS: 50.0000 ug | ORAL_TABLET | Freq: Every day | ORAL | Status: DC

## 2013-06-26 MED ORDER — ATORVASTATIN CALCIUM 10 MG PO TABS: 10.0000 mg | ORAL_TABLET | Freq: Every day | ORAL | Status: DC

## 2013-06-26 NOTE — Telephone Encounter (Signed)
done

## 2013-08-18 ENCOUNTER — Encounter: Payer: Self-pay | Admitting: Family Medicine

## 2013-08-18 ENCOUNTER — Ambulatory Visit (INDEPENDENT_AMBULATORY_CARE_PROVIDER_SITE_OTHER): Payer: Medicare Other | Admitting: Family Medicine

## 2013-08-18 ENCOUNTER — Encounter (INDEPENDENT_AMBULATORY_CARE_PROVIDER_SITE_OTHER): Payer: Self-pay

## 2013-08-18 VITALS — BP 148/75 | HR 79 | Temp 97.2°F | Ht 61.0 in | Wt 116.0 lb

## 2013-08-18 DIAGNOSIS — J449 Chronic obstructive pulmonary disease, unspecified: Secondary | ICD-10-CM

## 2013-08-18 DIAGNOSIS — C189 Malignant neoplasm of colon, unspecified: Secondary | ICD-10-CM | POA: Insufficient documentation

## 2013-08-18 DIAGNOSIS — E559 Vitamin D deficiency, unspecified: Secondary | ICD-10-CM

## 2013-08-18 DIAGNOSIS — E782 Mixed hyperlipidemia: Secondary | ICD-10-CM

## 2013-08-18 DIAGNOSIS — Z23 Encounter for immunization: Secondary | ICD-10-CM

## 2013-08-18 DIAGNOSIS — Z85038 Personal history of other malignant neoplasm of large intestine: Secondary | ICD-10-CM

## 2013-08-18 DIAGNOSIS — I10 Essential (primary) hypertension: Secondary | ICD-10-CM

## 2013-08-18 DIAGNOSIS — M1990 Unspecified osteoarthritis, unspecified site: Secondary | ICD-10-CM

## 2013-08-18 DIAGNOSIS — M129 Arthropathy, unspecified: Secondary | ICD-10-CM

## 2013-08-18 MED ORDER — LOSARTAN POTASSIUM 100 MG PO TABS: 100.0000 mg | ORAL_TABLET | Freq: Every day | ORAL | Status: DC

## 2013-08-18 NOTE — Progress Notes (Signed)
Subjective:    Patient ID: Brianna [name], female    DOB: 02-15-1925, 79 y.o.   MRN: [id number]  HPI Pt here for follow up and mangement of chronic medical problems. History of total right hip arthroplasty. She continues to complain of arthritic pain and some fatigue. Health maintenance parameters she will be due for pelvic exam in December. She will be due a mammogram in January. She'll her flu shot and Prevnar today. She will return to clinic for her fasting lab work. She also needs to stay up with her FOBT's and this will be given today.      Patient Active Problem List   Diagnosis Date Noted  . Mitral valve disease 05/27/2013  . Hyperlipidemia 03/25/2013  . Hypertension 03/25/2013  . Expected blood loss anemia 03/05/2013  . S/P right THA, AA 03/04/2013  . Preoperative cardiovascular examination 02/16/2013  . Essential hypertension, benign 02/16/2013  . Mixed hyperlipidemia 02/16/2013  . COPD (chronic obstructive pulmonary disease) 02/16/2013  . Arthritis 10/12/2011   Outpatient Encounter Prescriptions as of 08/18/2013  Medication Sig Dispense Refill  . acetaminophen (TYLENOL) 500 MG tablet Take 500 mg by mouth every 6 (six) hours as needed. Pain        . aspirin 81 MG tablet Take 81 mg by mouth 3 (three) times a week.      Marland Kitchen atorvastatin (LIPITOR) 10 MG tablet Take 1 tablet (10 mg total) by mouth at bedtime.  90 tablet  0  . beta carotene w/minerals (OCUVITE) tablet Take 1 tablet by mouth daily.        . Calcium Citrate-Vitamin D (CITRACAL + D PO) Take 2 tablets by mouth daily.       . Cholecalciferol (VITAMIN D3) 2000 UNITS TABS Take 1 tablet by mouth daily.       Tery Sanfilippo Sodium (DSS) 100 MG CAPS Take 100 mg by mouth daily.      . Fish Oil OIL Take 5 mLs by mouth daily.      Marland Kitchen levothyroxine (SYNTHROID, LEVOTHROID) 25 MCG tablet Take 2 tablets (50 mcg total) by mouth daily before breakfast.  180 tablet  0  . losartan (COZAAR) 100 MG tablet Take 100 mg by mouth daily.       . [DISCONTINUED] ferrous sulfate 325 (65 FE) MG tablet Take 325 mg by mouth daily.       No facility-administered encounter medications on file as of 08/18/2013.    Review of Systems  Constitutional: Negative.   HENT: Negative.   Eyes: Negative.   Respiratory: Negative.   Cardiovascular: Negative.   Gastrointestinal: Negative.   Endocrine: Negative.   Genitourinary: Negative.   Musculoskeletal: Positive for arthralgias.  Skin: Negative.   Allergic/Immunologic: Negative.   Neurological: Negative.   Hematological: Negative.   Psychiatric/Behavioral: Negative.        Objective:   Physical Exam  Nursing note and vitals reviewed. Constitutional: She is oriented to person, place, and time. She appears well-developed and well-nourished. No distress.  For age  of 13 years  HENT:  Head: Normocephalic and atraumatic.  Right Ear: External ear normal.  Left Ear: External ear normal.  Nose: Nose normal.  Mouth/Throat: Oropharynx is clear and moist. No oropharyngeal exudate.  Eyes: Conjunctivae and EOM are normal. Right eye exhibits no discharge. Left eye exhibits no discharge. No scleral icterus.  Neck: Normal range of motion. Neck supple. No JVD present. No tracheal deviation present. No thyromegaly present.  Cardiovascular: Normal rate, normal heart sounds and intact  distal pulses.  Exam reveals no gallop and no friction rub.   No murmur heard. On initial exam patient had a bigeminy rhythm at 84 per minute. This rhythm became normal sinus when she was auscultated  later in the visit  Pulmonary/Chest: Effort normal and breath sounds normal. No respiratory distress. She has no wheezes. She has no rales. She exhibits no tenderness.  There was no axillary adenopathy  Abdominal: Soft. Bowel sounds are normal. She exhibits no mass. There is no tenderness. There is no rebound and no guarding.  Musculoskeletal: Normal range of motion. She exhibits no edema and no tenderness.   Lymphadenopathy:    She has no cervical adenopathy.  Neurological: She is alert and oriented to person, place, and time. She has normal reflexes. No cranial nerve deficit.  Not quite as responsive and reply to questions  Skin: Skin is warm and dry.  Psychiatric: She has a normal mood and affect. Her behavior is normal. Judgment and thought content normal.   BP 148/75  Pulse 79  Temp(Src) 97.2 F (36.2 C) (Oral)  Ht 5\' 1"  (1.549 m)  Wt 116 lb (52.617 kg)  BMI 21.93 kg/m2        Assessment & Plan:   1. Arthritis   2. Essential hypertension, benign   3. Mixed hyperlipidemia   4. COPD (chronic obstructive pulmonary disease)   5. Vitamin D deficiency   6. History of colon cancer    Orders Placed This Encounter  Procedures  . Hepatic function panel    Standing Status: Future     Number of Occurrences:      Standing Expiration Date: 08/18/2014  . BMP8+EGFR    Standing Status: Future     Number of Occurrences:      Standing Expiration Date: 08/18/2014  . NMR, lipoprofile    Standing Status: Future     Number of Occurrences:      Standing Expiration Date: 08/18/2014  . Vit D  25 hydroxy (rtn osteoporosis monitoring)    Standing Status: Future     Number of Occurrences:      Standing Expiration Date: 08/18/2014  . POCT CBC    Standing Status: Future     Number of Occurrences:      Standing Expiration Date: 09/18/2013   Meds ordered this encounter  Medications  . losartan (COZAAR) 100 MG tablet    Sig: Take 1 tablet (100 mg total) by mouth daily.    Dispense:  90 tablet    Refill:  3   Nyra Capes MD

## 2013-08-18 NOTE — Addendum Note (Signed)
Addended by: Baltazar Apo on: 08/18/2013 01:01 PM   Modules accepted: Orders

## 2013-08-18 NOTE — Patient Instructions (Addendum)
Continue current medications. Continue good therapeutic lifestyle changes.  Fall precautions discussed with patient. Follow up as planned and earlier as needed.  Try to increase her physical activity Return FOBT Return to clinic for lab work Mammogram will be scheduled for January Pelvic exam will be scheduled for December

## 2013-08-21 ENCOUNTER — Other Ambulatory Visit (INDEPENDENT_AMBULATORY_CARE_PROVIDER_SITE_OTHER): Payer: Medicare Other

## 2013-08-21 DIAGNOSIS — M129 Arthropathy, unspecified: Secondary | ICD-10-CM

## 2013-08-21 DIAGNOSIS — J449 Chronic obstructive pulmonary disease, unspecified: Secondary | ICD-10-CM

## 2013-08-21 DIAGNOSIS — Z1212 Encounter for screening for malignant neoplasm of rectum: Secondary | ICD-10-CM

## 2013-08-21 DIAGNOSIS — E782 Mixed hyperlipidemia: Secondary | ICD-10-CM

## 2013-08-21 DIAGNOSIS — E559 Vitamin D deficiency, unspecified: Secondary | ICD-10-CM

## 2013-08-21 DIAGNOSIS — J4489 Other specified chronic obstructive pulmonary disease: Secondary | ICD-10-CM

## 2013-08-21 DIAGNOSIS — M1990 Unspecified osteoarthritis, unspecified site: Secondary | ICD-10-CM

## 2013-08-21 DIAGNOSIS — I10 Essential (primary) hypertension: Secondary | ICD-10-CM

## 2013-08-21 LAB — POCT CBC
Granulocyte percent: 58.3 %G (ref 37–80)
HCT, POC: 37.5 % — AB (ref 37.7–47.9)
Hemoglobin: 12.2 g/dL (ref 12.2–16.2)
POC Granulocyte: 2.7 (ref 2–6.9)
RBC: 4.2 M/uL (ref 4.04–5.48)

## 2013-08-21 NOTE — Progress Notes (Signed)
Patient came in for labs only.

## 2013-08-21 NOTE — Addendum Note (Signed)
Addended by: Roselyn Reef on: 08/21/2013 05:26 PM   Modules accepted: Orders

## 2013-08-23 LAB — BMP8+EGFR
BUN/Creatinine Ratio: 21 (ref 11–26)
Chloride: 101 mmol/L (ref 97–108)
GFR calc Af Amer: 62 mL/min/{1.73_m2} (ref >59)
GFR calc non Af Amer: 54 mL/min/{1.73_m2} — ABNORMAL LOW (ref >59)
Potassium: 4.3 mmol/L (ref 3.5–5.2)

## 2013-08-23 LAB — NMR, LIPOPROFILE
Cholesterol: 172 mg/dL (ref <200)
HDL Particle Number: 33.2 umol/L (ref >=30.5)
LDL Size: 21.1 nm (ref >20.5)
LDLC SERPL CALC-MCNC: 79 mg/dL (ref <100)
LP-IR Score: <25 (ref <=45)
Triglycerides by NMR: 54 mg/dL (ref <150)

## 2013-08-23 LAB — HEPATIC FUNCTION PANEL
Albumin: 4.3 g/dL (ref 3.5–4.7)
Alkaline Phosphatase: 67 IU/L (ref 39–117)
Bilirubin, Direct: 0.11 mg/dL (ref 0.00–0.40)
Total Protein: 6.9 g/dL (ref 6.0–8.5)

## 2013-10-07 ENCOUNTER — Other Ambulatory Visit: Payer: Self-pay | Admitting: Family Medicine

## 2013-10-15 MED ORDER — LEVOTHYROXINE SODIUM 25 MCG PO TABS: 50.0000 ug | ORAL_TABLET | Freq: Every day | ORAL | Status: DC

## 2013-10-15 MED ORDER — ATORVASTATIN CALCIUM 10 MG PO TABS: 10.0000 mg | ORAL_TABLET | Freq: Every day | ORAL | Status: DC

## 2013-10-15 NOTE — Telephone Encounter (Signed)
Prescriptions are okay for one year

## 2013-10-15 NOTE — Telephone Encounter (Signed)
Patient wanting mail order meds  Last seen 08/18/13  DWM  No thyroid labs in EPIC  Lipids 08/21/13  If approved print for mail order and route to nurse

## 2013-11-04 ENCOUNTER — Ambulatory Visit (INDEPENDENT_AMBULATORY_CARE_PROVIDER_SITE_OTHER): Payer: Medicare Other | Admitting: Nurse Practitioner

## 2013-11-04 ENCOUNTER — Encounter: Payer: Self-pay | Admitting: Nurse Practitioner

## 2013-11-04 VITALS — BP 189/88 | HR 83 | Temp 96.7°F | Ht 61.0 in | Wt 116.0 lb

## 2013-11-04 DIAGNOSIS — Z124 Encounter for screening for malignant neoplasm of cervix: Secondary | ICD-10-CM

## 2013-11-04 DIAGNOSIS — Z01419 Encounter for gynecological examination (general) (routine) without abnormal findings: Secondary | ICD-10-CM

## 2013-11-04 LAB — POCT URINALYSIS DIPSTICK
Bilirubin, UA: NEGATIVE
GLUCOSE UA: NEGATIVE
Ketones, UA: NEGATIVE
LEUKOCYTES UA: NEGATIVE
NITRITE UA: NEGATIVE
PH UA: 7
Protein, UA: NEGATIVE
Spec Grav, UA: 1.01
UROBILINOGEN UA: NEGATIVE

## 2013-11-04 NOTE — Progress Notes (Signed)
   Subjective:    Patient ID: Brianna [name], female    DOB: [birth year], 80 y.o.   MRN: [id number]  HPI Patient is a regular patient of Dr. Laurance Flatten that is here today for PAP and pelvic exam only- She is doing well and has no complaints.    Review of Systems  Constitutional: Negative.   HENT: Negative.   Eyes: Negative.   Respiratory: Negative.   Cardiovascular: Negative.   Gastrointestinal: Negative.   Genitourinary: Negative.   Musculoskeletal: Negative.   Neurological: Negative.   Hematological: Negative.   Psychiatric/Behavioral: Negative.   All other systems reviewed and are negative.       Objective:   Physical Exam  Constitutional: She is oriented to person, place, and time. She appears well-developed and well-nourished.  HENT:  Head: Normocephalic.  Right Ear: Hearing, tympanic membrane, external ear and ear canal normal.  Left Ear: Hearing, tympanic membrane, external ear and ear canal normal.  Nose: Nose normal.  Mouth/Throat: Uvula is midline and oropharynx is clear and moist.  Eyes: Conjunctivae and EOM are normal. Pupils are equal, round, and reactive to light.  Neck: Normal range of motion and full passive range of motion without pain. Neck supple. No JVD present. Carotid bruit is not present. No mass and no thyromegaly present.  Cardiovascular: Normal rate, normal heart sounds and intact distal pulses.   No murmur heard. Pulmonary/Chest: Effort normal and breath sounds normal.  Abdominal: Soft. Bowel sounds are normal. She exhibits no mass. There is no tenderness.  Genitourinary: Vagina normal and uterus normal. No breast swelling, tenderness, discharge or bleeding.  bimanual exam-No adnexal masses or tenderness. Cervix nonparous and pink no discharge  Musculoskeletal: Normal range of motion.  Lymphadenopathy:    She has no cervical adenopathy.  Neurological: She is alert and oriented to person, place, and time.  Skin: Skin is warm and dry.  Psychiatric:  She has a normal mood and affect. Her behavior is normal. Judgment and thought content normal.    BP 189/88  Pulse 83  Temp(Src) 96.7 F (35.9 C) (Oral)  Ht 5\' 1"  (1.549 m)  Wt 116 lb (52.617 kg)  BMI 21.93 kg/m2       Assessment & Plan:   1. Encounter for routine gynecological examination    Orders Placed This Encounter  Procedures  . POCT urinalysis dipstick   Keep follow up with Dr. Mayra Neer, FNP

## 2013-11-04 NOTE — Patient Instructions (Signed)

## 2013-11-06 ENCOUNTER — Telehealth: Payer: Self-pay | Admitting: Family Medicine

## 2013-11-06 LAB — PAP IG (IMAGE GUIDED): PAP SMEAR COMMENT: 0

## 2013-11-06 NOTE — Telephone Encounter (Signed)
Message copied by Waverly Ferrari on Thu Nov 06, 2013  3:56 PM ------      Message from: Chevis Pretty      Created: Thu Nov 06, 2013  2:04 PM       Urine -WNL      PAP normal- repeat in 2 years ------

## 2013-11-17 NOTE — Telephone Encounter (Signed)
Message copied by Thana Ates on Mon Nov 17, 2013 10:32 AM ------      Message from: Chevis Pretty      Created: Thu Nov 06, 2013  2:04 PM       Urine -WNL      PAP normal- repeat in 2 years ------

## 2013-11-17 NOTE — Telephone Encounter (Signed)
PT AWARE OF PAP / URINE RESULTS.  WILL MAKE APPT IN 1YRS FOR PAP.  RS

## 2013-11-27 ENCOUNTER — Encounter: Payer: Self-pay | Admitting: Cardiology

## 2013-11-27 ENCOUNTER — Ambulatory Visit (INDEPENDENT_AMBULATORY_CARE_PROVIDER_SITE_OTHER): Payer: Medicare Other | Admitting: Cardiology

## 2013-11-27 VITALS — BP 181/80 | HR 69 | Ht 61.0 in | Wt 117.0 lb

## 2013-11-27 DIAGNOSIS — Z79899 Other long term (current) drug therapy: Secondary | ICD-10-CM

## 2013-11-27 DIAGNOSIS — I059 Rheumatic mitral valve disease, unspecified: Secondary | ICD-10-CM

## 2013-11-27 DIAGNOSIS — I10 Essential (primary) hypertension: Secondary | ICD-10-CM

## 2013-11-27 MED ORDER — HYDROCHLOROTHIAZIDE 12.5 MG PO CAPS: 12.5000 mg | ORAL_CAPSULE | Freq: Every day | ORAL | Status: DC

## 2013-11-27 NOTE — Patient Instructions (Signed)
   Begin HCTZ 12.5mg  daily - new sent to pharm Continue all other medications.   Lab for BMET - due in 2 weeks Office will contact with results via phone or letter.   Your physician wants you to follow up in: 6 months.  You will receive a reminder letter in the mail one-two months in advance.  If you don't receive a letter, please call our office to schedule the follow up appointment

## 2013-11-27 NOTE — Progress Notes (Signed)
Clinical Summary Ms. Khurana is an 80 y.o.female last seen in July 2014. She has done relatively well from a cardiac perspective. She reports compliance with her medications. Blood pressure was elevated today, we discussed options for management.  Lab work from October 2014 showed BUN 20, creatinine 0.9, potassium 4.3.  Echocardiogram from April 2014 demonstrated mild LVH with LVEF 21-19%, grade 1 diastolic dysfunction, mildly calcified aortic valve with mild aortic regurgitation, myxomatous mitral valve with mild prolapse and moderate to severe eccentric mitral regurgitation, moderate left atrial enlargement, mild right atrial enlargement, mild tricuspid regurgitation with PASP 32 mmHg.  We have been managing her mitral valve disease conservatively.   Allergies  Allergen Reactions  . Alendronate Sodium Nausea Only  . Lodine [Etodolac]     unknown  . Risedronate Sodium     unknown    Current Outpatient Prescriptions  Medication Sig Dispense Refill  . acetaminophen (TYLENOL) 500 MG tablet Take 500 mg by mouth every 6 (six) hours as needed. Pain        . aspirin 81 MG tablet Take 81 mg by mouth 3 (three) times a week.      Marland Kitchen atorvastatin (LIPITOR) 10 MG tablet Take 1 tablet (10 mg total) by mouth at bedtime.  90 tablet  0  . beta carotene w/minerals (OCUVITE) tablet Take 1 tablet by mouth daily.        . Calcium Citrate-Vitamin D (CITRACAL + D PO) Take 2 tablets by mouth daily.       . Cholecalciferol (VITAMIN D3) 2000 UNITS TABS Take 1 tablet by mouth daily.       Mariane Baumgarten Sodium (DSS) 100 MG CAPS Take 100 mg by mouth daily.      . Fish Oil OIL Take 5 mLs by mouth daily.      Marland Kitchen levothyroxine (SYNTHROID, LEVOTHROID) 25 MCG tablet Take 25-50 mcg by mouth as directed. Take 50 MG Monday through Friday and take 25 MG on Saturday and Sunday      . losartan (COZAAR) 100 MG tablet Take 1 tablet (100 mg total) by mouth daily.  90 tablet  3  . hydrochlorothiazide (MICROZIDE) 12.5 MG  capsule Take 1 capsule (12.5 mg total) by mouth daily.  30 capsule  6   No current facility-administered medications for this visit.    Past Medical History  Diagnosis Date  . Mixed hyperlipidemia   . Lumbosacral spondylosis without myelopathy   . Fibrocystic breast   . Osteoporosis   . Symptomatic menopausal or female climacteric states   . Colon polyp   . Essential hypertension, benign   . Rectosigmoid cancer     T1N0, 2004  . PONV (postoperative nausea and vomiting)   . COPD (chronic obstructive pulmonary disease)   . Hypothyroidism   . Ventral hernia   . Bilateral inguinal hernia (BIH)   . Obturator hernia, right   . Myxomatous mitral valve     Mild prolapse with eccentric, moderate to severe regurgitation  . Carpal tunnel syndrome on right   . Frequency of urination   . History of skin cancer     Social History Ms. Brand reports that she has never smoked. She does not have any smokeless tobacco history on file. Ms. Stepp reports that she does not drink alcohol.  Review of Systems No palpitations or syncope. No orthopnea or PND. Reports NYHA class II dyspnea. Mild right hip pain. Otherwise negative.  Physical Examination Filed Vitals:   11/27/13 1058  BP:  181/80  Pulse:    Filed Weights   11/27/13 1054  Weight: 117 lb (53.071 kg)   Patient in no acute distress, appears somewhat younger than stated age.  HEENT: Conjunctiva and lids normal, oropharynx clear.  Neck: Supple, no elevated JVP or carotid bruits, no thyromegaly.  Lungs: Clear to auscultation, nonlabored breathing at rest.  Cardiac: Regular rate and rhythm, no S3, mid 2-9/4 systolic murmur after click heard in mid systole mainly in the lateral thorax and even into the back , no pericardial rub.  Abdomen: Soft, nontender, bowel sounds present.  Extremities: No pitting edema, distal pulses 2+.    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 Patient reports no progressive symptomatology.  Essential hypertension, benign Continue Cozaar, add HCTZ 12.5 mg daily. Followup BMET in 2 weeks. May need a potassium supplement.    Satira Sark, M.D., F.A.C.C.

## 2013-11-27 NOTE — Assessment & Plan Note (Signed)
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 Patient reports no progressive symptomatology.

## 2013-11-27 NOTE — Assessment & Plan Note (Signed)
Continue Cozaar, add HCTZ 12.5 mg daily. Followup BMET in 2 weeks. May need a potassium supplement.

## 2013-11-27 NOTE — Addendum Note (Signed)
Addended by: Laurine Blazer on: 11/27/2013 11:19 AM   Modules accepted: Orders

## 2013-12-22 ENCOUNTER — Ambulatory Visit (INDEPENDENT_AMBULATORY_CARE_PROVIDER_SITE_OTHER): Payer: Medicare Other | Admitting: Family Medicine

## 2013-12-22 ENCOUNTER — Encounter: Payer: Self-pay | Admitting: Family Medicine

## 2013-12-22 VITALS — BP 154/64 | HR 47 | Temp 97.0°F | Ht 61.0 in | Wt 118.0 lb

## 2013-12-22 DIAGNOSIS — M1990 Unspecified osteoarthritis, unspecified site: Secondary | ICD-10-CM

## 2013-12-22 DIAGNOSIS — E785 Hyperlipidemia, unspecified: Secondary | ICD-10-CM

## 2013-12-22 DIAGNOSIS — I10 Essential (primary) hypertension: Secondary | ICD-10-CM

## 2013-12-22 DIAGNOSIS — E782 Mixed hyperlipidemia: Secondary | ICD-10-CM

## 2013-12-22 DIAGNOSIS — Z85038 Personal history of other malignant neoplasm of large intestine: Secondary | ICD-10-CM

## 2013-12-22 DIAGNOSIS — E559 Vitamin D deficiency, unspecified: Secondary | ICD-10-CM

## 2013-12-22 DIAGNOSIS — M129 Arthropathy, unspecified: Secondary | ICD-10-CM

## 2013-12-22 LAB — POCT CBC
Granulocyte percent: 58.2 %G (ref 37–80)
HCT, POC: 38.3 % (ref 37.7–47.9)
Hemoglobin: 12.2 g/dL (ref 12.2–16.2)
LYMPH, POC: 1.5 (ref 0.6–3.4)
MCH: 29.4 pg (ref 27–31.2)
MCHC: 31.9 g/dL (ref 31.8–35.4)
MCV: 92 fL (ref 80–97)
MPV: 8.5 fL (ref 0–99.8)
PLATELET COUNT, POC: 170 10*3/uL (ref 142–424)
POC Granulocyte: 2.5 (ref 2–6.9)
POC LYMPH PERCENT: 34 %L (ref 10–50)
RBC: 4.2 M/uL (ref 4.04–5.48)
RDW, POC: 13.5 %
WBC: 4.3 10*3/uL — AB (ref 4.6–10.2)

## 2013-12-22 NOTE — Progress Notes (Signed)
Subjective:    Patient ID: Brianna [name], female    DOB: 1925/06/20, 80 y.o.   MRN: [id number]  HPI Pt here for follow up and management of chronic medical problems. Other than some arthritic complaints in her hands, she is doing well and is pleasant and looks younger than her stated age of 51. The patient is followed by the cardiologist, Dr. Domenic Polite. She is also followed by the orthopedist, Dr. Alvan Dame.         Patient Active Problem List   Diagnosis Date Noted  . Vitamin D deficiency 08/18/2013  . History of colon cancer 08/18/2013  . Mitral valve disease 05/27/2013  . Hyperlipidemia 03/25/2013  . Expected blood loss anemia 03/05/2013  . S/P right THA, AA 03/04/2013  . Essential hypertension, benign 02/16/2013  . Mixed hyperlipidemia 02/16/2013  . COPD (chronic obstructive pulmonary disease) 02/16/2013  . Arthritis 10/12/2011   Outpatient Encounter Prescriptions as of 12/22/2013  Medication Sig  . acetaminophen (TYLENOL) 500 MG tablet Take 500 mg by mouth every 6 (six) hours as needed. Pain    . aspirin 81 MG tablet Take 81 mg by mouth 3 (three) times a week.  Marland Kitchen atorvastatin (LIPITOR) 10 MG tablet Take 1 tablet (10 mg total) by mouth at bedtime.  . beta carotene w/minerals (OCUVITE) tablet Take 1 tablet by mouth daily.    . Calcium Citrate-Vitamin D (CITRACAL + D PO) Take 2 tablets by mouth daily.   . Cholecalciferol (VITAMIN D3) 2000 UNITS TABS Take 1 tablet by mouth daily.   Mariane Baumgarten Sodium (DSS) 100 MG CAPS Take 100 mg by mouth daily.  . Fish Oil OIL Take 5 mLs by mouth daily.  . hydrochlorothiazide (MICROZIDE) 12.5 MG capsule Take 1 capsule (12.5 mg total) by mouth daily.  Marland Kitchen levothyroxine (SYNTHROID, LEVOTHROID) 25 MCG tablet Take 25-50 mcg by mouth as directed. Take 50 MG Monday through Friday and take 25 MG on Saturday and Sunday  . losartan (COZAAR) 100 MG tablet Take 1 tablet (100 mg total) by mouth daily.    Review of Systems  Constitutional: Negative.     HENT: Negative.   Eyes: Negative.   Respiratory: Negative.   Cardiovascular: Negative.   Gastrointestinal: Negative.   Endocrine: Negative.   Genitourinary: Negative.   Musculoskeletal: Positive for arthralgias (hands are painful).  Skin: Negative.   Allergic/Immunologic: Negative.   Neurological: Negative.   Hematological: Negative.   Psychiatric/Behavioral: Negative.        Objective:   Physical Exam  Nursing note and vitals reviewed. Constitutional: She is oriented to person, place, and time. She appears well-developed and well-nourished. No distress.  HENT:  Head: Normocephalic and atraumatic.  Right Ear: External ear normal.  Left Ear: External ear normal.  Nose: Nose normal.  Mouth/Throat: Oropharynx is clear and moist.  Eyes: Conjunctivae and EOM are normal. Pupils are equal, round, and reactive to light. Right eye exhibits no discharge. Left eye exhibits no discharge. No scleral icterus.  Neck: Normal range of motion. Neck supple. No JVD present. No thyromegaly present.  Cardiovascular: Normal rate, regular rhythm and intact distal pulses.  Exam reveals no gallop and no friction rub.   Murmur heard. Pulmonary/Chest: Effort normal and breath sounds normal. No respiratory distress. She has no wheezes. She has no rales. She exhibits no tenderness.  No axillary adenopathy, lungs are clear anteriorly and posteriorly  Abdominal: Soft. Bowel sounds are normal. She exhibits no mass. There is no tenderness. There is no rebound and no  guarding.  Musculoskeletal: Normal range of motion. She exhibits no edema and no tenderness.  Arthritic deformities both hands with DIP joint swelling. No erythema  Lymphadenopathy:    She has no cervical adenopathy.  Neurological: She is alert and oriented to person, place, and time. She has normal reflexes. No cranial nerve deficit.  Minimal memory impairment  Skin: Skin is warm and dry.  Psychiatric: She has a normal mood and affect. Her  behavior is normal. Judgment and thought content normal.   BP 154/64  Pulse 47  Temp(Src) 97 F (36.1 C) (Oral)  Ht '5\' 1"'  (1.549 m)  Wt 118 lb (53.524 kg)  BMI 22.31 kg/m2        Assessment & Plan:  1. Essential hypertension, benign - POCT CBC - BMP8+EGFR - Hepatic function panel  2. Hyperlipidemia - POCT CBC - Lipid panel  3. Mixed hyperlipidemia - POCT CBC  4. Vitamin D deficiency - POCT CBC - Vit D  25 hydroxy (rtn osteoporosis monitoring)  5. Arthritis - POCT CBC  6. History of colon cancer  No orders of the defined types were placed in this encounter.   Patient Instructions  Continue current medications. Continue good therapeutic lifestyle changes which include good diet and exercise. Fall precautions discussed with the patient. Take Tylenol for aches pains and fever In the winter time, it use gloves to keep her hands warm Monitor blood pressure readings at home and return these readings for review in 2-3 weeks, and bring these readings every time you come to your office visit   Arrie Senate MD

## 2013-12-22 NOTE — Patient Instructions (Addendum)
Continue current medications. Continue good therapeutic lifestyle changes which include good diet and exercise. Fall precautions discussed with the patient. Take Tylenol for aches pains and fever In the winter time, it use gloves to keep her hands warm Monitor blood pressure readings at home and return these readings for review in 2-3 weeks, and bring these readings every time you come to your office visit

## 2013-12-23 LAB — HEPATIC FUNCTION PANEL
ALT: 11 IU/L (ref 0–32)
AST: 22 IU/L (ref 0–40)
Albumin: 4.3 g/dL (ref 3.5–4.7)
Alkaline Phosphatase: 72 IU/L (ref 39–117)
BILIRUBIN TOTAL: 0.3 mg/dL (ref 0.0–1.2)
Bilirubin, Direct: 0.12 mg/dL (ref 0.00–0.40)
Total Protein: 7.1 g/dL (ref 6.0–8.5)

## 2013-12-23 LAB — LIPID PANEL
Chol/HDL Ratio: 2.3 ratio units (ref 0.0–4.4)
Cholesterol, Total: 191 mg/dL (ref 100–199)
HDL: 84 mg/dL (ref >39)
LDL Calculated: 94 mg/dL (ref 0–99)
TRIGLYCERIDES: 63 mg/dL (ref 0–149)
VLDL Cholesterol Cal: 13 mg/dL (ref 5–40)

## 2013-12-23 LAB — BMP8+EGFR
BUN / CREAT RATIO: 25 (ref 11–26)
BUN: 24 mg/dL (ref 8–27)
CHLORIDE: 99 mmol/L (ref 97–108)
CO2: 23 mmol/L (ref 18–29)
Calcium: 9.7 mg/dL (ref 8.7–10.3)
Creatinine, Ser: 0.96 mg/dL (ref 0.57–1.00)
GFR calc Af Amer: 61 mL/min/{1.73_m2} (ref >59)
GFR calc non Af Amer: 53 mL/min/{1.73_m2} — ABNORMAL LOW (ref >59)
Glucose: 89 mg/dL (ref 65–99)
POTASSIUM: 4.3 mmol/L (ref 3.5–5.2)
SODIUM: 139 mmol/L (ref 134–144)

## 2013-12-23 LAB — VITAMIN D 25 HYDROXY (VIT D DEFICIENCY, FRACTURES): VIT D 25 HYDROXY: 58.5 ng/mL (ref 30.0–100.0)

## 2014-01-12 ENCOUNTER — Encounter: Payer: Self-pay | Admitting: *Deleted

## 2014-02-09 ENCOUNTER — Other Ambulatory Visit: Payer: Self-pay | Admitting: Family Medicine

## 2014-02-11 MED ORDER — LEVOTHYROXINE SODIUM 25 MCG PO TABS: ORAL_TABLET | ORAL | Status: DC

## 2014-02-11 NOTE — Telephone Encounter (Signed)
Pt has not had thyroid labs since 01/2012

## 2014-02-11 NOTE — Telephone Encounter (Signed)
Please have this patient come in and get thyroid labs We will go ahead and refill the medication

## 2014-02-12 ENCOUNTER — Telehealth: Payer: Self-pay | Admitting: Family Medicine

## 2014-02-13 MED ORDER — LEVOTHYROXINE SODIUM 25 MCG PO TABS: ORAL_TABLET | ORAL | Status: DC

## 2014-02-13 NOTE — Telephone Encounter (Signed)
Medication was filled at a local drug store and patient prefers to use mail order. Will reorder medication and have Dr. Laurance Flatten sign prescription.

## 2014-02-14 ENCOUNTER — Telehealth: Payer: Self-pay | Admitting: Family Medicine

## 2014-02-14 MED ORDER — LEVOTHYROXINE SODIUM 25 MCG PO TABS: ORAL_TABLET | ORAL | Status: DC

## 2014-02-14 NOTE — Telephone Encounter (Signed)
Sent rx for thyroid med in to [facility] electronically per patients request

## 2014-04-13 ENCOUNTER — Encounter: Payer: Self-pay | Admitting: Family Medicine

## 2014-04-13 ENCOUNTER — Ambulatory Visit (INDEPENDENT_AMBULATORY_CARE_PROVIDER_SITE_OTHER): Payer: Medicare Other | Admitting: Family Medicine

## 2014-04-13 VITALS — BP 151/72 | HR 71 | Temp 96.5°F | Ht 61.0 in | Wt 120.0 lb

## 2014-04-13 DIAGNOSIS — E785 Hyperlipidemia, unspecified: Secondary | ICD-10-CM

## 2014-04-13 DIAGNOSIS — J449 Chronic obstructive pulmonary disease, unspecified: Secondary | ICD-10-CM

## 2014-04-13 DIAGNOSIS — E559 Vitamin D deficiency, unspecified: Secondary | ICD-10-CM

## 2014-04-13 DIAGNOSIS — I10 Essential (primary) hypertension: Secondary | ICD-10-CM

## 2014-04-13 DIAGNOSIS — M79642 Pain in left hand: Secondary | ICD-10-CM

## 2014-04-13 DIAGNOSIS — M79641 Pain in right hand: Secondary | ICD-10-CM | POA: Insufficient documentation

## 2014-04-13 DIAGNOSIS — M79609 Pain in unspecified limb: Secondary | ICD-10-CM

## 2014-04-13 LAB — POCT CBC
Granulocyte percent: 62.9 %G (ref 37–80)
HCT, POC: 37.3 % — AB (ref 37.7–47.9)
HEMOGLOBIN: 12.1 g/dL — AB (ref 12.2–16.2)
Lymph, poc: 1.3 (ref 0.6–3.4)
MCH, POC: 30.6 pg (ref 27–31.2)
MCHC: 32.5 g/dL (ref 31.8–35.4)
MCV: 93.9 fL (ref 80–97)
MPV: 8.9 fL (ref 0–99.8)
POC GRANULOCYTE: 2.5 (ref 2–6.9)
POC LYMPH %: 33.5 % (ref 10–50)
Platelet Count, POC: 172 10*3/uL (ref 142–424)
RBC: 4 M/uL — AB (ref 4.04–5.48)
RDW, POC: 13.2 %
WBC: 4 10*3/uL — AB (ref 4.6–10.2)

## 2014-04-13 MED ORDER — ATORVASTATIN CALCIUM 10 MG PO TABS: 10.0000 mg | ORAL_TABLET | Freq: Every day | ORAL | Status: DC

## 2014-04-13 NOTE — Patient Instructions (Addendum)
Medicare Annual Wellness Visit  [facility] and the medical providers at [facility] strive to bring you the best medical care.  In doing so we not only want to address your current medical conditions and concerns but also to detect new conditions early and prevent illness, disease and health-related problems.    Medicare offers a yearly Wellness Visit which allows our clinical staff to assess your need for preventative services including immunizations, lifestyle education, counseling to decrease risk of preventable diseases and screening for fall risk and other medical concerns.    This visit is provided free of charge (no copay) for all Medicare recipients. The clinical pharmacists at [facility] have begun to conduct these Wellness Visits which will also include a thorough review of all your medications.    As you primary medical provider recommend that you make an appointment for your Annual Wellness Visit if you have not done so already this year.  You may set up this appointment before you leave today or you may call back ([phone]) and schedule an appointment.  Please make sure when you call that you mention that you are scheduling your Annual Wellness Visit with the clinical pharmacist so that the appointment may be made for the proper length of time.     Continue current medications. Continue good therapeutic lifestyle changes which include good diet and exercise. Fall precautions discussed with patient. If an FOBT was given today- please return it to our front desk. If you are over 83 years old - you may need Prevnar 51 or the adult Pneumonia vaccine.  Please discuss with your daughter the possibility of having you wear a Lifeline at home since you live by yourself. Continue to try to get as much exercise as possible despite your continued hip pain Drink plenty of fluids If the hand pain progresses and you decide you  want to go see a rheumatologist please let us know

## 2014-04-13 NOTE — Progress Notes (Signed)
Subjective:    Patient ID: Brianna [name], female    DOB: [birth year], 80 y.o.   MRN: [id number]  HPI Pt here for follow up and management of chronic medical problems. The patient is calm and alert and her biggest complaint today is the pain in both of her hands. She has a history of working in the [employer] in the past. She says her hands are particularly painful at nighttime. Otherwise she has no other complaints other than a followup on her hypertension hyperlipidemia vitamin D deficiency and COPD. She also has a remote history of colon cancer. She indicates that she lives 4 houses from her daughter. She does not have a life alert or lifeline to wear at home.        Patient Active Problem List   Diagnosis Date Noted  . Vitamin D deficiency 08/18/2013  . History of colon cancer 08/18/2013  . Mitral valve disease 05/27/2013  . Hyperlipidemia 03/25/2013  . Expected blood loss anemia 03/05/2013  . S/P right THA, AA 03/04/2013  . Essential hypertension, benign 02/16/2013  . Mixed hyperlipidemia 02/16/2013  . COPD (chronic obstructive pulmonary disease) 02/16/2013  . Arthritis 10/12/2011   Outpatient Encounter Prescriptions as of 04/13/2014  Medication Sig  . acetaminophen (TYLENOL) 500 MG tablet Take 500 mg by mouth every 6 (six) hours as needed. Pain    . aspirin 81 MG tablet Take 81 mg by mouth 3 (three) times a week.  Marland Kitchen atorvastatin (LIPITOR) 10 MG tablet Take 1 tablet (10 mg total) by mouth at bedtime.  . beta carotene w/minerals (OCUVITE) tablet Take 1 tablet by mouth daily.    . Calcium Citrate-Vitamin D (CITRACAL + D PO) Take 2 tablets by mouth daily.   . Cholecalciferol (VITAMIN D3) 2000 UNITS TABS Take 1 tablet by mouth daily.   Mariane Baumgarten Sodium (DSS) 100 MG CAPS Take 100 mg by mouth daily.  . Fish Oil OIL Take 5 mLs by mouth daily.  . hydrochlorothiazide (MICROZIDE) 12.5 MG capsule Take 1 capsule (12.5 mg total) by mouth daily.  Marland Kitchen levothyroxine (SYNTHROID,  LEVOTHROID) 25 MCG tablet Take 50 MG Monday through Friday and take 25 MG on Saturday and Sunday  . losartan (COZAAR) 100 MG tablet Take 1 tablet (100 mg total) by mouth daily.  . [DISCONTINUED] atorvastatin (LIPITOR) 10 MG tablet Take 1 tablet (10 mg total) by mouth at bedtime.    Review of Systems  Constitutional: Negative.   HENT: Negative.   Eyes: Negative.   Respiratory: Negative.   Cardiovascular: Negative.   Gastrointestinal: Negative.   Endocrine: Negative.   Genitourinary: Negative.   Musculoskeletal: Positive for arthralgias (bilateral hand pain).  Skin: Negative.   Allergic/Immunologic: Negative.   Neurological: Negative.   Hematological: Negative.   Psychiatric/Behavioral: Negative.        Objective:   Physical Exam  Nursing note and vitals reviewed. Constitutional: She is oriented to person, place, and time. She appears well-developed and well-nourished. No distress.  Pleasant and alert and younger looking than her stated age  HENT:  Head: Normocephalic and atraumatic.  Right Ear: External ear normal.  Left Ear: External ear normal.  Nose: Nose normal.  Mouth/Throat: Oropharynx is clear and moist.  Eyes: Conjunctivae and EOM are normal. Pupils are equal, round, and reactive to light. Right eye exhibits no discharge. Left eye exhibits no discharge. No scleral icterus.  Neck: Normal range of motion. Neck supple. No thyromegaly present.  Cardiovascular: Normal rate, regular rhythm, normal heart sounds  and intact distal pulses.  Exam reveals no gallop and no friction rub.   No murmur heard. Pulmonary/Chest: Effort normal and breath sounds normal. No respiratory distress. She has no wheezes. She has no rales. She exhibits no tenderness.  Abdominal: Soft. Bowel sounds are normal. She exhibits no mass. There is no tenderness. There is no rebound and no guarding.  Musculoskeletal: Normal range of motion. She exhibits no edema. Tenderness:  both hands.  D. appendectomy  and PIP joint swelling of both hands with deformity, there was no rubor or erythema  Lymphadenopathy:    She has no cervical adenopathy.  Neurological: She is alert and oriented to person, place, and time. She has normal reflexes. No cranial nerve deficit.  Skin: Skin is warm and dry. No rash noted.  Psychiatric: She has a normal mood and affect. Her behavior is normal. Judgment and thought content normal.   BP 151/72  Pulse 71  Temp(Src) 96.5 F (35.8 C) (Oral)  Ht '5\' 1"'  (1.549 m)  Wt 120 lb (54.432 kg)  BMI 22.69 kg/m2        Assessment & Plan:  1. Essential hypertension, benign - POCT CBC - BMP8+EGFR - Thyroid Panel With TSH  2. Other and unspecified hyperlipidemia - Lipid panel - Hepatic function panel - Thyroid Panel With TSH  3. Unspecified vitamin D deficiency - Vit D  25 hydroxy (rtn osteoporosis monitoring)  4. COPD (chronic obstructive pulmonary disease) - Thyroid Panel With TSH  5. Hyperlipidemia  6. Vitamin D deficiency  7. Bilateral hand pain Meds ordered this encounter  Medications  . atorvastatin (LIPITOR) 10 MG tablet    Sig: Take 1 tablet (10 mg total) by mouth at bedtime.    Dispense:  90 tablet    Refill:  2    As directed   Patient Instructions                       Medicare Annual Wellness Visit  [facility] and the medical providers at [facility] strive to bring you the best medical care.  In doing so we not only want to address your current medical conditions and concerns but also to detect new conditions early and prevent illness, disease and health-related problems.    Medicare offers a yearly Wellness Visit which allows our clinical staff to assess your need for preventative services including immunizations, lifestyle education, counseling to decrease risk of preventable diseases and screening for fall risk and other medical concerns.    This visit is provided free of charge (no copay) for all Medicare  recipients. The clinical pharmacists at [facility] have begun to conduct these Wellness Visits which will also include a thorough review of all your medications.    As you primary medical provider recommend that you make an appointment for your Annual Wellness Visit if you have not done so already this year.  You may set up this appointment before you leave today or you may call back ([phone]) and schedule an appointment.  Please make sure when you call that you mention that you are scheduling your Annual Wellness Visit with the clinical pharmacist so that the appointment may be made for the proper length of time.     Continue current medications. Continue good therapeutic lifestyle changes which include good diet and exercise. Fall precautions discussed with patient. If an FOBT was given today- please return it to our front desk. If you are over 50  years old - you may need Prevnar 47 or the adult Pneumonia vaccine.  Please discuss with your daughter the possibility of having you wear a Lifeline at home since you live by yourself. Continue to try to get as much exercise as possible despite your continued hip pain Drink plenty of fluids If the hand pain progresses and you decide you want to go see a rheumatologist please let us know   Arrie Senate MD

## 2014-04-14 ENCOUNTER — Telehealth: Payer: Self-pay | Admitting: Family Medicine

## 2014-04-14 LAB — THYROID PANEL WITH TSH
FREE THYROXINE INDEX: 2 (ref 1.2–4.9)
T3 UPTAKE RATIO: 31 % (ref 24–39)
T4, Total: 6.5 ug/dL (ref 4.5–12.0)
TSH: 2.05 u[IU]/mL (ref 0.450–4.500)

## 2014-04-14 LAB — LIPID PANEL
CHOLESTEROL TOTAL: 190 mg/dL (ref 100–199)
Chol/HDL Ratio: 2.3 ratio units (ref 0.0–4.4)
HDL: 83 mg/dL (ref >39)
LDL Calculated: 95 mg/dL (ref 0–99)
Triglycerides: 60 mg/dL (ref 0–149)
VLDL CHOLESTEROL CAL: 12 mg/dL (ref 5–40)

## 2014-04-14 LAB — HEPATIC FUNCTION PANEL
ALBUMIN: 4.4 g/dL (ref 3.5–4.7)
ALK PHOS: 63 IU/L (ref 39–117)
ALT: 10 IU/L (ref 0–32)
AST: 20 IU/L (ref 0–40)
BILIRUBIN DIRECT: 0.11 mg/dL (ref 0.00–0.40)
BILIRUBIN TOTAL: 0.3 mg/dL (ref 0.0–1.2)
Total Protein: 7 g/dL (ref 6.0–8.5)

## 2014-04-14 LAB — BMP8+EGFR
BUN/Creatinine Ratio: 26 (ref 11–26)
BUN: 25 mg/dL (ref 8–27)
CALCIUM: 9.7 mg/dL (ref 8.7–10.3)
CHLORIDE: 100 mmol/L (ref 97–108)
CO2: 24 mmol/L (ref 18–29)
Creatinine, Ser: 0.98 mg/dL (ref 0.57–1.00)
GFR calc Af Amer: 60 mL/min/{1.73_m2} (ref >59)
GFR, EST NON AFRICAN AMERICAN: 52 mL/min/{1.73_m2} — AB (ref >59)
GLUCOSE: 87 mg/dL (ref 65–99)
POTASSIUM: 4.1 mmol/L (ref 3.5–5.2)
Sodium: 138 mmol/L (ref 134–144)

## 2014-04-14 LAB — VITAMIN D 25 HYDROXY (VIT D DEFICIENCY, FRACTURES): Vit D, 25-Hydroxy: 54.5 ng/mL (ref 30.0–100.0)

## 2014-04-14 NOTE — Telephone Encounter (Signed)
Message copied by Cline Crock on Tue Apr 14, 2014  2:38 PM ------      Message from: Chipper Herb      Created: Tue Apr 14, 2014  2:01 PM       Blood sugar was good at 87. The creatinine, most important kidney function test is within normal limits. The electrolytes including potassium are good       With a traditional lipid panel all cholesterol numbers are good and at goal, continue current treatment      All liver function tests are within normal      All thyroid function tests are within normal-----continue current treatment      The vitamin D level is excellent and within normal limits. Continue current treatment ------

## 2014-04-15 NOTE — Telephone Encounter (Signed)
Patient aware.

## 2014-04-17 ENCOUNTER — Telehealth: Payer: Self-pay | Admitting: Family Medicine

## 2014-04-17 NOTE — Telephone Encounter (Signed)
Message copied by Cline Crock on Fri Apr 17, 2014 11:23 AM ------      Message from: Chipper Herb      Created: Tue Apr 14, 2014  2:01 PM       Blood sugar was good at 87. The creatinine, most important kidney function test is within normal limits. The electrolytes including potassium are good       With a traditional lipid panel all cholesterol numbers are good and at goal, continue current treatment      All liver function tests are within normal      All thyroid function tests are within normal-----continue current treatment      The vitamin D level is excellent and within normal limits. Continue current treatment ------

## 2014-04-20 ENCOUNTER — Ambulatory Visit: Payer: Medicare Other | Admitting: Family Medicine

## 2014-05-26 ENCOUNTER — Ambulatory Visit: Payer: Medicare Other | Admitting: Cardiology

## 2014-06-08 ENCOUNTER — Ambulatory Visit (INDEPENDENT_AMBULATORY_CARE_PROVIDER_SITE_OTHER): Payer: Medicare Other | Admitting: Cardiology

## 2014-06-08 ENCOUNTER — Encounter: Payer: Self-pay | Admitting: Cardiology

## 2014-06-08 VITALS — BP 169/72 | HR 66 | Ht 61.0 in | Wt 118.0 lb

## 2014-06-08 DIAGNOSIS — I10 Essential (primary) hypertension: Secondary | ICD-10-CM

## 2014-06-08 DIAGNOSIS — I059 Rheumatic mitral valve disease, unspecified: Secondary | ICD-10-CM

## 2014-06-08 MED ORDER — HYDROCHLOROTHIAZIDE 12.5 MG PO CAPS: 12.5000 mg | ORAL_CAPSULE | Freq: Every day | ORAL | Status: DC

## 2014-06-08 NOTE — Assessment & Plan Note (Signed)
Continues on Cozaar and HCTZ.

## 2014-06-08 NOTE — Progress Notes (Signed)
Clinical Summary Brianna [name] is an 80 y.o.female last seen in January. She has been stable in terms of dyspnea on exertion, NYHA class II, no chest pain. ECG today shows sinus rhythm with low voltage, PACs.  Lab work in June showed hemoglobin 12.1, platelets 172, BUN 25, creatinine 0.9, potassium 4.1, cholesterol 190, triglycerides 60, HDL 83, LDL 95, normal LFTs.  Echocardiogram from April 2014 demonstrated mild LVH with LVEF 95-28%, grade 1 diastolic dysfunction, mildly calcified aortic valve with mild aortic regurgitation, myxomatous mitral valve with mild prolapse and moderate to severe eccentric mitral regurgitation, moderate left atrial enlargement, mild right atrial enlargement, mild tricuspid regurgitation with PASP 32 mmHg.   Allergies  Allergen Reactions  . Alendronate Sodium Nausea Only  . Lodine [Etodolac]     unknown  . Risedronate Sodium     unknown    Current Outpatient Prescriptions  Medication Sig Dispense Refill  . acetaminophen (TYLENOL) 500 MG tablet Take 500 mg by mouth every 6 (six) hours as needed. Pain        . aspirin 81 MG tablet Take 81 mg by mouth 3 (three) times a week.      Marland Kitchen atorvastatin (LIPITOR) 10 MG tablet Take 1 tablet (10 mg total) by mouth at bedtime.  90 tablet  2  . beta carotene w/minerals (OCUVITE) tablet Take 1 tablet by mouth daily.        . Calcium Citrate-Vitamin D (CITRACAL + D PO) Take 2 tablets by mouth daily.       . Cholecalciferol (VITAMIN D3) 2000 UNITS TABS Take 1 tablet by mouth daily.       . Fish Oil OIL Take 5 mLs by mouth daily.      Marland Kitchen levothyroxine (SYNTHROID, LEVOTHROID) 25 MCG tablet Take 50 MG Monday through Friday and take 25 MG on Saturday and Sunday  180 tablet  3  . losartan (COZAAR) 100 MG tablet Take 1 tablet (100 mg total) by mouth daily.  90 tablet  3  . omega-3 acid ethyl esters (LOVAZA) 1 G capsule Take 1 g by mouth daily.      . hydrochlorothiazide (MICROZIDE) 12.5 MG capsule Take 1 capsule (12.5 mg total)  by mouth daily.  90 capsule  6   No current facility-administered medications for this visit.    Past Medical History  Diagnosis Date  . Mixed hyperlipidemia   . Lumbosacral spondylosis without myelopathy   . Fibrocystic breast   . Osteoporosis   . Symptomatic menopausal or female climacteric states   . Colon polyp   . Essential hypertension, benign   . Rectosigmoid cancer     T1N0, 2004  . PONV (postoperative nausea and vomiting)   . COPD (chronic obstructive pulmonary disease)   . Hypothyroidism   . Ventral hernia   . Bilateral inguinal hernia (BIH)   . Obturator hernia, right   . Myxomatous mitral valve     Mild prolapse with eccentric, moderate to severe regurgitation  . Carpal tunnel syndrome on right   . Frequency of urination   . History of skin cancer     Social History Ms. Tupou reports that she has never smoked. She does not have any smokeless tobacco history on file. Ms. Stille reports that she does not drink alcohol.  Review of Systems No palpitations or syncope. Stable appetite. Hip pain better after surgery last year. Other systems reviewed and negative except as outlined.  Physical Examination Filed Vitals:   06/08/14 1501  BP:  169/72  Pulse: 66   Filed Weights   06/08/14 1501  Weight: 118 lb (53.524 kg)    Patient in no acute distress, appears somewhat younger than stated age.  HEENT: Conjunctiva and lids normal, oropharynx clear.  Neck: Supple, no elevated JVP or carotid bruits, no thyromegaly.  Lungs: Clear to auscultation, nonlabored breathing at rest.  Cardiac: Regular rate and rhythm, no S3, 2/6 systolic murmur in mid systole mainly in the lateral thorax and even into the back , no pericardial rub.  Abdomen: Soft, nontender, bowel sounds present.  Extremities: No pitting edema, distal pulses 2+.    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 Patient is symptomatically  stable, prefers conservative approach. We will monitor symptomatically and on exam for now. Followup in 6 months.  Essential hypertension, benign Continues on Cozaar and HCTZ.    Satira Sark, M.D., F.A.C.C.

## 2014-06-08 NOTE — Assessment & Plan Note (Signed)
Myxomatous mitral valve with moderate to severe regurgitation. Patient is symptomatically stable, prefers conservative approach. We will monitor symptomatically and on exam for now. Followup in 6 months.

## 2014-06-08 NOTE — Patient Instructions (Signed)
Continue all current medications. Your physician wants you to follow up in: 6 months.  You will receive a reminder letter in the mail one-two months in advance.  If you don't receive a letter, please call our office to schedule the follow up appointment   

## 2014-08-07 ENCOUNTER — Telehealth: Payer: Self-pay | Admitting: Family Medicine

## 2014-08-10 NOTE — Telephone Encounter (Signed)
Apt changed

## 2014-08-10 NOTE — Telephone Encounter (Signed)
Lm- need to reschedule appt on 10/26

## 2014-08-24 ENCOUNTER — Encounter: Payer: Self-pay | Admitting: Family Medicine

## 2014-08-24 ENCOUNTER — Ambulatory Visit (INDEPENDENT_AMBULATORY_CARE_PROVIDER_SITE_OTHER): Payer: Medicare Other | Admitting: Family Medicine

## 2014-08-24 VITALS — BP 148/78 | HR 86 | Temp 97.0°F | Ht 61.0 in | Wt 117.4 lb

## 2014-08-24 DIAGNOSIS — Z23 Encounter for immunization: Secondary | ICD-10-CM

## 2014-08-24 DIAGNOSIS — D649 Anemia, unspecified: Secondary | ICD-10-CM

## 2014-08-24 DIAGNOSIS — B9789 Other viral agents as the cause of diseases classified elsewhere: Secondary | ICD-10-CM

## 2014-08-24 DIAGNOSIS — J069 Acute upper respiratory infection, unspecified: Secondary | ICD-10-CM

## 2014-08-24 DIAGNOSIS — I10 Essential (primary) hypertension: Secondary | ICD-10-CM

## 2014-08-24 DIAGNOSIS — Z780 Asymptomatic menopausal state: Secondary | ICD-10-CM

## 2014-08-24 DIAGNOSIS — E559 Vitamin D deficiency, unspecified: Secondary | ICD-10-CM

## 2014-08-24 DIAGNOSIS — J302 Other seasonal allergic rhinitis: Secondary | ICD-10-CM

## 2014-08-24 DIAGNOSIS — I491 Atrial premature depolarization: Secondary | ICD-10-CM

## 2014-08-24 DIAGNOSIS — E785 Hyperlipidemia, unspecified: Secondary | ICD-10-CM

## 2014-08-24 LAB — POCT CBC
Granulocyte percent: 61.6 %G (ref 37–80)
HEMATOCRIT: 38.8 % (ref 37.7–47.9)
Hemoglobin: 12.5 g/dL (ref 12.2–16.2)
Lymph, poc: 1.5 (ref 0.6–3.4)
MCH: 29.9 pg (ref 27–31.2)
MCHC: 32.2 g/dL (ref 31.8–35.4)
MCV: 92.9 fL (ref 80–97)
MPV: 9.4 fL (ref 0–99.8)
PLATELET COUNT, POC: 204 10*3/uL (ref 142–424)
POC Granulocyte: 3.1 (ref 2–6.9)
POC LYMPH PERCENT: 28.5 %L (ref 10–50)
RBC: 4.2 M/uL (ref 4.04–5.48)
RDW, POC: 14.4 %
WBC: 5.1 10*3/uL (ref 4.6–10.2)

## 2014-08-24 NOTE — Patient Instructions (Signed)
Continue all meds Continue Lifestyle modification- diet and exercise Fall precautions discussed Keep follow up appointments with any specialists  Please return the FOBT You'll be scheduled for a DEXA scan You'll be given the flu shot today If you start running any fever or you get worse with your respiratory symptoms, please call back Drink plenty of fluids Gargle with warm salty water Use nasal saline spray as directed over-the-counter Continue cough medicine as needed

## 2014-08-24 NOTE — Progress Notes (Signed)
Subjective:    Patient ID: Brianna [name], female    DOB: 09-Oct-1925, 80 y.o.   MRN: [id number]  HPIPatient is here for her 4 month follow up and management of chronic medical problems. She does complain of some right ear pain and cough. The drainage and congestion and sputum is all clear. This is been going on for about a week. She has not had any fever. She says her eyes have been watering some and she has had some cough. The patient has a history of frequent PACs and has seen the cardiologist in the past 3 months.    Review of Systems  Constitutional: Negative.   HENT: Positive for ear pain.   Eyes: Negative.   Respiratory: Positive for cough.   Cardiovascular: Negative.   Gastrointestinal: Negative.   Endocrine: Negative.   Genitourinary: Negative.   Musculoskeletal: Negative.   Skin: Negative.   Allergic/Immunologic: Negative.   Neurological: Negative.   Hematological: Negative.   Psychiatric/Behavioral: Negative.         Patient Active Problem List   Diagnosis Date Noted  . Bilateral hand pain 04/13/2014  . Vitamin D deficiency 08/18/2013  . History of colon cancer 08/18/2013  . Mitral valve disease 05/27/2013  . Hyperlipidemia 03/25/2013  . Expected blood loss anemia 03/05/2013  . S/P right THA, AA 03/04/2013  . Essential hypertension, benign 02/16/2013  . Mixed hyperlipidemia 02/16/2013  . COPD (chronic obstructive pulmonary disease) 02/16/2013  . Arthritis 10/12/2011   Outpatient Encounter Prescriptions as of 08/24/2014  Medication Sig  . acetaminophen (TYLENOL) 500 MG tablet Take 500 mg by mouth every 6 (six) hours as needed. Pain    . aspirin 81 MG tablet Take 81 mg by mouth 3 (three) times a week.  Marland Kitchen atorvastatin (LIPITOR) 10 MG tablet Take 1 tablet (10 mg total) by mouth at bedtime.  . beta carotene w/minerals (OCUVITE) tablet Take 1 tablet by mouth daily.    . Calcium Citrate-Vitamin D (CITRACAL + D PO) Take 2 tablets by mouth daily.   .  Cholecalciferol (VITAMIN D3) 2000 UNITS TABS Take 1 tablet by mouth daily.   . Fish Oil OIL Take 5 mLs by mouth daily.  . hydrochlorothiazide (MICROZIDE) 12.5 MG capsule Take 1 capsule (12.5 mg total) by mouth daily.  Marland Kitchen levothyroxine (SYNTHROID, LEVOTHROID) 25 MCG tablet Take 50 MG Monday through Friday and take 25 MG on Saturday and Sunday  . losartan (COZAAR) 100 MG tablet Take 1 tablet (100 mg total) by mouth daily.  . [DISCONTINUED] omega-3 acid ethyl esters (LOVAZA) 1 G capsule Take 1 g by mouth daily.   Objective:   Physical Exam  Nursing note and vitals reviewed. Constitutional: She is oriented to person, place, and time. She appears well-developed and well-nourished. No distress.  Alert, and looking much younger than her stated age of 58.  HENT:  Head: Normocephalic and atraumatic.  Right Ear: External ear normal.  Left Ear: External ear normal.  Mouth/Throat: Oropharynx is clear and moist.  The throat appeared normal There is nasal turbinate congestion and pallor bilaterally  Eyes: Conjunctivae and EOM are normal. Pupils are equal, round, and reactive to light. Right eye exhibits no discharge. Left eye exhibits no discharge. No scleral icterus.  Neck: Normal range of motion. Neck supple. No JVD present. No thyromegaly present.  No carotid bruits or anterior cervical adenopathy  Cardiovascular: Normal rate and normal heart sounds.  Exam reveals no gallop and no friction rub.   No murmur heard. The  pedal pulses were slightly diminished on the left. The inguinal pulses were good bilaterally. The heart was irregular at 72/min  Pulmonary/Chest: Effort normal and breath sounds normal. No respiratory distress. She has no wheezes. She has no rales. She exhibits no tenderness.  Lungs were clear anteriorly and posteriorly  Abdominal: Soft. Bowel sounds are normal. She exhibits no mass. There is no tenderness. There is no rebound and no guarding.  There were no masses or adenopathy palpable.   Musculoskeletal: Normal range of motion. She exhibits no edema and no tenderness.  Lymphadenopathy:    She has no cervical adenopathy.  Neurological: She is alert and oriented to person, place, and time. She has normal reflexes. No cranial nerve deficit.  Skin: Skin is warm and dry. No rash noted.  Psychiatric: She has a normal mood and affect. Her behavior is normal. Judgment and thought content normal.  Somewhat hesitant and responding to questions that were asked of her   BP 148/78  Pulse 86  Temp(Src) 97 F (36.1 C) (Oral)  Ht '5\' 1"'  (1.549 m)  Wt 117 lb 7 oz (53.269 kg)  BMI 22.20 kg/m2         Assessment & Plan:  1. Hyperlipidemia - NMR, lipoprofile - Hepatic function panel  2. Vitamin D deficiency - Vit D  25 hydroxy (rtn osteoporosis monitoring) - DG Bone Density; Future  3. Essential hypertension, benign - BMP8+EGFR  4. Anemia, unspecified anemia type - POCT CBC  5. Postmenopausal - DG Bone Density; Future  6. Viral URI with cough  7. Atrial premature contractions -Continue follow-up with cardiology  8. Other seasonal allergic rhinitis  Patient Instructions  Continue all meds Continue Lifestyle modification- diet and exercise Fall precautions discussed Keep follow up appointments with any specialists  Please return the FOBT You'll be scheduled for a DEXA scan You'll be given the flu shot today If you start running any fever or you get worse with your respiratory symptoms, please call back Drink plenty of fluids Gargle with warm salty water Use nasal saline spray as directed over-the-counter Continue cough medicine as needed   Arrie Senate MD

## 2014-08-25 LAB — BMP8+EGFR
BUN / CREAT RATIO: 24 (ref 11–26)
BUN: 23 mg/dL (ref 8–27)
CO2: 26 mmol/L (ref 18–29)
Calcium: 10 mg/dL (ref 8.7–10.3)
Chloride: 97 mmol/L (ref 97–108)
Creatinine, Ser: 0.97 mg/dL (ref 0.57–1.00)
GFR calc Af Amer: 61 mL/min/{1.73_m2} (ref >59)
GFR calc non Af Amer: 53 mL/min/{1.73_m2} — ABNORMAL LOW (ref >59)
Glucose: 91 mg/dL (ref 65–99)
POTASSIUM: 4.3 mmol/L (ref 3.5–5.2)
Sodium: 140 mmol/L (ref 134–144)

## 2014-08-25 LAB — HEPATIC FUNCTION PANEL
ALT: 18 IU/L (ref 0–32)
AST: 22 IU/L (ref 0–40)
Albumin: 4.5 g/dL (ref 3.5–4.7)
Alkaline Phosphatase: 71 IU/L (ref 39–117)
Bilirubin, Direct: 0.12 mg/dL (ref 0.00–0.40)
Total Bilirubin: 0.4 mg/dL (ref 0.0–1.2)
Total Protein: 7.2 g/dL (ref 6.0–8.5)

## 2014-08-25 LAB — NMR, LIPOPROFILE
Cholesterol: 186 mg/dL (ref 100–199)
HDL Cholesterol by NMR: 72 mg/dL (ref >39)
HDL PARTICLE NUMBER: 30.6 umol/L (ref >=30.5)
LDL Particle Number: 983 nmol/L (ref <1000)
LDL Size: 21.3 nm (ref >20.5)
LDL-C: 102 mg/dL — ABNORMAL HIGH (ref 0–99)
SMALL LDL PARTICLE NUMBER: 235 nmol/L (ref <=527)
Triglycerides by NMR: 62 mg/dL (ref 0–149)

## 2014-08-25 LAB — VITAMIN D 25 HYDROXY (VIT D DEFICIENCY, FRACTURES): VIT D 25 HYDROXY: 55.6 ng/mL (ref 30.0–100.0)

## 2014-08-26 ENCOUNTER — Other Ambulatory Visit: Payer: Medicare Other

## 2014-08-26 DIAGNOSIS — Z1212 Encounter for screening for malignant neoplasm of rectum: Secondary | ICD-10-CM

## 2014-08-28 LAB — FECAL OCCULT BLOOD, IMMUNOCHEMICAL: Fecal Occult Bld: NEGATIVE

## 2014-09-02 ENCOUNTER — Encounter: Payer: Self-pay | Admitting: *Deleted

## 2014-09-06 ENCOUNTER — Other Ambulatory Visit: Payer: Self-pay | Admitting: Family Medicine

## 2014-11-25 ENCOUNTER — Encounter: Payer: Self-pay | Admitting: Pharmacist

## 2014-11-25 ENCOUNTER — Ambulatory Visit (INDEPENDENT_AMBULATORY_CARE_PROVIDER_SITE_OTHER): Payer: Medicare Other

## 2014-11-25 ENCOUNTER — Ambulatory Visit (INDEPENDENT_AMBULATORY_CARE_PROVIDER_SITE_OTHER): Payer: Medicare Other | Admitting: Pharmacist

## 2014-11-25 VITALS — Ht 60.5 in | Wt 119.5 lb

## 2014-11-25 DIAGNOSIS — E559 Vitamin D deficiency, unspecified: Secondary | ICD-10-CM

## 2014-11-25 DIAGNOSIS — M810 Age-related osteoporosis without current pathological fracture: Secondary | ICD-10-CM

## 2014-11-25 DIAGNOSIS — Z780 Asymptomatic menopausal state: Secondary | ICD-10-CM

## 2014-11-25 LAB — HM DEXA SCAN

## 2014-11-25 NOTE — Progress Notes (Signed)
Patient ID: Brianna [name], female   DOB: 20-Aug-1925, 81 y.o.   MRN: [id number]  [facility] Current Height: Height: 5' 0.5" (153.7 cm)      Max Lifetime Height:  5\' 1"  Current Weight: Weight: 119 lb 8 oz (54.205 kg)       Ethnicity:Caucasian    HPI: Does pt already have a diagnosis of:  Osteopenia?  No Osteoporosis?  Yes  Back Pain?  Yes       Kyphosis?  Yes Right hip replacement Prior fracture?  No Med(s) for Osteoporosis/Osteopenia:  None currently Med(s) previously tried for Osteoporosis/Osteopenia:  Took Actonel for over 5 years - stopped around 2012;  Took alendroante - hurt stomach;  Tried evista - no much change in BMD                                                             PMH: Age at menopause:  81 yo Hysterectomy?  No Oophorectomy?  No HRT? Yes - Former.  Type/duration: prempro Steroid Use?  No Thyroid med?  Yes History of cancer?  Yes - colon cancer History of digestive disorders (ie Crohn's)?  No Current or previous eating disorders?  No Last Vitamin D Result:  55.6 (08/24/2014) Last GFR Result:  53 (08/24/2014)   FH/SH: Family history of osteoporosis?  Yes - possibly daughter Parent with history of hip fracture?  No Family history of breast cancer? Yes - sister Exercise?  No Smoking?  No Alcohol?  No    Calcium Assessment Calcium Intake  # of servings/day  Calcium mg  Milk (8 oz) 1  x  300  = 300mg   Yogurt (4 oz) 0 x  200 = 0  Cheese (1 oz) 0 x  200 = 0  Other Calcium sources   250mg   Ca supplement 500mg  / day = 500mg    Estimated calcium intake per day 1050mg     DEXA Results Date of Test T-Score for AP Spine L1-L4 T-Score for Total Left Hip T-Score for Total Right Hip  11/25/2014 -1.2 -2.8 --  08/21/2012 -1.3 -2.7 --  07/28/2009 -2.4 -2.8 --  05/06/2007 -2.7 -2.7 --   Assessment: Osteoporosis with stable BMD but no current thearpy  Recommendations: 1.  Will look into insurance coverage of the following options - prolia, actonel  and evista. 2.  continue calcium 1200mg  daily through supplementation or diet.  3.  continue weight bearing exercise - 30 minutes at least 4 days per week.   4.  Counseled and educated about fall risk and prevention.  Recheck DEXA:  2 years  Time spent counseling patient:  30 minutes   Cherre Robins, PharmD, CPP

## 2014-11-25 NOTE — Patient Instructions (Signed)
Fall Prevention and Home Safety Falls cause injuries and can affect all age groups. It is possible to use preventive measures to significantly decrease the likelihood of falls. There are many simple measures which can make your home safer and prevent falls. OUTDOORS  Repair cracks and edges of walkways and driveways.  Remove high doorway thresholds.  Trim shrubbery on the main path into your home.  Have good outside lighting.  Clear walkways of tools, rocks, debris, and clutter.  Check that handrails are not broken and are securely fastened. Both sides of steps should have handrails.  Have leaves, snow, and ice cleared regularly.  Use sand or salt on walkways during winter months.  In the garage, clean up grease or oil spills. BATHROOM  Install night lights.  Install grab bars by the toilet and in the tub and shower.  Use non-skid mats or decals in the tub or shower.  Place a plastic non-slip stool in the shower to sit on, if needed.  Keep floors dry and clean up all water on the floor immediately.  Remove soap buildup in the tub or shower on a regular basis.  Secure bath mats with non-slip, double-sided rug tape.  Remove throw rugs and tripping hazards from the floors. BEDROOMS  Install night lights.  Make sure a bedside light is easy to reach.  Do not use oversized bedding.  Keep a telephone by your bedside.  Have a firm chair with side arms to use for getting dressed.  Remove throw rugs and tripping hazards from the floor. KITCHEN  Keep handles on pots and pans turned toward the center of the stove. Use back burners when possible.  Clean up spills quickly and allow time for drying.  Avoid walking on wet floors.  Avoid hot utensils and knives.  Position shelves so they are not too high or low.  Place commonly used objects within easy reach.  If necessary, use a sturdy step stool with a grab bar when reaching.  Keep electrical cables out of the  way.  Do not use floor polish or wax that makes floors slippery. If you must use wax, use non-skid floor wax.  Remove throw rugs and tripping hazards from the floor. STAIRWAYS  Never leave objects on stairs.  Place handrails on both sides of stairways and use them. Fix any loose handrails. Make sure handrails on both sides of the stairways are as long as the stairs.  Check carpeting to make sure it is firmly attached along stairs. Make repairs to worn or loose carpet promptly.  Avoid placing throw rugs at the top or bottom of stairways, or properly secure the rug with carpet tape to prevent slippage. Get rid of throw rugs, if possible.  Have an electrician put in a light switch at the top and bottom of the stairs. OTHER FALL PREVENTION TIPS  Wear low-heel or rubber-soled shoes that are supportive and fit well. Wear closed toe shoes.  When using a stepladder, make sure it is fully opened and both spreaders are firmly locked. Do not climb a closed stepladder.  Add color or contrast paint or tape to grab bars and handrails in your home. Place contrasting color strips on first and last steps.  Learn and use mobility aids as needed. Install an electrical emergency response system.  Turn on lights to avoid dark areas. Replace light bulbs that burn out immediately. Get light switches that glow.  Arrange furniture to create clear pathways. Keep furniture in the same place.    Firmly attach carpet with non-skid or double-sided tape.  Eliminate uneven floor surfaces.  Select a carpet pattern that does not visually hide the edge of steps.  Be aware of all pets. OTHER HOME SAFETY TIPS  Set the water temperature for 120 F (48.8 C).  Keep emergency numbers on or near the telephone.  Keep smoke detectors on every level of the home and near sleeping areas. Document Released: 10/06/2002 Document Revised: 04/16/2012 Document Reviewed: 01/05/2012 [facility] Patient Information 2015  [place], [place]. This information is not intended to replace advice given to you by your health care provider. Make sure you discuss any questions you have with your health care provider.                Exercise for Strong Bones  Exercise is important to build and maintain strong bones / bone density.  There are 2 types of exercises that are important to building and maintaining strong bones:  Weight- bearing and muscle-stregthening.  Weight-bearing Exercises  These exercises include activities that make you move against gravity while staying upright. Weight-bearing exercises can be high-impact or low-impact.  High-impact weight-bearing exercises help build bones and keep them strong. If you have broken a bone due to osteoporosis or are at risk of breaking a bone, you may need to avoid high-impact exercises. If you're not sure, you should check with your healthcare provider.  Examples of high-impact weight-bearing exercises are: Dancing  Doing high-impact aerobics  Hiking  Jogging/running  Jumping Rope  Stair climbing  Tennis  Low-impact weight-bearing exercises can also help keep bones strong and are a safe alternative if you cannot do high-impact exercises.   Examples of low-impact weight-bearing exercises are: Using elliptical training machines  Doing low-impact aerobics  Using stair-step machines  Fast walking on a treadmill or outside   Muscle-Strengthening Exercises These exercises include activities where you move your body, a weight or some other resistance against gravity. They are also known as resistance exercises and include: Lifting weights  Using elastic exercise bands  Using weight machines  Lifting your own body weight  Functional movements, such as standing and rising up on your toes  Yoga and Pilates can also improve strength, balance and flexibility. However, certain positions may not be safe for people with osteoporosis or those at increased risk of broken  bones. For example, exercises that have you bend forward may increase the chance of breaking a bone in the spine.   Non-Impact Exercises There are other types of exercises that can help prevent falls.  Non-impact exercises can help you to improve balance, posture and how well you move in everyday activities. Some of these exercises include: Balance exercises that strengthen your legs and test your balance, such as Tai Chi, can decrease your risk of falls.  Posture exercises that improve your posture and reduce rounded or "sloping" shoulders can help you decrease the chance of breaking a bone, especially in the spine.  Functional exercises that improve how well you move can help you with everyday activities and decrease your chance of falling and breaking a bone. For example, if you have trouble getting up from a chair or climbing stairs, you should do these activities as exercises.   **A physical therapist can teach you balance, posture and functional exercises. He/she can also help you learn which exercises are safe and appropriate for you.  [facility] has a physical therapy office in [place] in front of our office and referrals can be made for assessments  and treatment as needed and strength and balance training.  If you would like to have an assessment with [place] and our physical therapy team please let a nurse or provider know.

## 2014-12-03 ENCOUNTER — Encounter: Payer: Self-pay | Admitting: Family Medicine

## 2014-12-03 ENCOUNTER — Encounter: Payer: Self-pay | Admitting: Cardiology

## 2014-12-03 ENCOUNTER — Ambulatory Visit (INDEPENDENT_AMBULATORY_CARE_PROVIDER_SITE_OTHER): Payer: Medicare Other | Admitting: Family Medicine

## 2014-12-03 ENCOUNTER — Ambulatory Visit (INDEPENDENT_AMBULATORY_CARE_PROVIDER_SITE_OTHER): Payer: Medicare Other

## 2014-12-03 ENCOUNTER — Ambulatory Visit (INDEPENDENT_AMBULATORY_CARE_PROVIDER_SITE_OTHER): Payer: Medicare Other | Admitting: Cardiology

## 2014-12-03 VITALS — BP 180/78 | HR 83 | Temp 98.2°F | Ht 60.5 in | Wt 120.0 lb

## 2014-12-03 VITALS — BP 161/75 | HR 86 | Ht 61.0 in | Wt 120.0 lb

## 2014-12-03 DIAGNOSIS — M5030 Other cervical disc degeneration, unspecified cervical region: Secondary | ICD-10-CM

## 2014-12-03 DIAGNOSIS — E559 Vitamin D deficiency, unspecified: Secondary | ICD-10-CM

## 2014-12-03 DIAGNOSIS — R208 Other disturbances of skin sensation: Secondary | ICD-10-CM

## 2014-12-03 DIAGNOSIS — I499 Cardiac arrhythmia, unspecified: Secondary | ICD-10-CM

## 2014-12-03 DIAGNOSIS — M4310 Spondylolisthesis, site unspecified: Secondary | ICD-10-CM

## 2014-12-03 DIAGNOSIS — I059 Rheumatic mitral valve disease, unspecified: Secondary | ICD-10-CM

## 2014-12-03 DIAGNOSIS — M79602 Pain in left arm: Secondary | ICD-10-CM

## 2014-12-03 DIAGNOSIS — E785 Hyperlipidemia, unspecified: Secondary | ICD-10-CM

## 2014-12-03 DIAGNOSIS — D649 Anemia, unspecified: Secondary | ICD-10-CM

## 2014-12-03 DIAGNOSIS — Q762 Congenital spondylolisthesis: Secondary | ICD-10-CM

## 2014-12-03 DIAGNOSIS — R200 Anesthesia of skin: Secondary | ICD-10-CM

## 2014-12-03 DIAGNOSIS — J449 Chronic obstructive pulmonary disease, unspecified: Secondary | ICD-10-CM

## 2014-12-03 DIAGNOSIS — I10 Essential (primary) hypertension: Secondary | ICD-10-CM

## 2014-12-03 LAB — POCT CBC
Granulocyte percent: 54.8 %G (ref 37–80)
HCT, POC: 38.1 % (ref 37.7–47.9)
Hemoglobin: 11.8 g/dL — AB (ref 12.2–16.2)
LYMPH, POC: 1.8 (ref 0.6–3.4)
MCH: 28.8 pg (ref 27–31.2)
MCHC: 30.9 g/dL — AB (ref 31.8–35.4)
MCV: 93.1 fL (ref 80–97)
MPV: 8.3 fL (ref 0–99.8)
POC Granulocyte: 2.9 (ref 2–6.9)
POC LYMPH %: 34.8 % (ref 10–50)
Platelet Count, POC: 195 10*3/uL (ref 142–424)
RBC: 4.1 M/uL (ref 4.04–5.48)
RDW, POC: 14.2 %
WBC: 5.3 10*3/uL (ref 4.6–10.2)

## 2014-12-03 MED ORDER — METHYLPREDNISOLONE ACETATE 80 MG/ML IJ SUSP
60.0000 mg | Freq: Once | INTRAMUSCULAR | Status: AC
  Administered 2014-12-03: 60 mg via INTRAMUSCULAR

## 2014-12-03 NOTE — Patient Instructions (Addendum)
Medicare Annual Wellness Visit  [facility] and the medical providers at [facility] strive to bring you the best medical care.  In doing so we not only want to address your current medical conditions and concerns but also to detect new conditions early and prevent illness, disease and health-related problems.    Medicare offers a yearly Wellness Visit which allows our clinical staff to assess your need for preventative services including immunizations, lifestyle education, counseling to decrease risk of preventable diseases and screening for fall risk and other medical concerns.    This visit is provided free of charge (no copay) for all Medicare recipients. The clinical pharmacists at [facility] have begun to conduct these Wellness Visits which will also include a thorough review of all your medications.    As you primary medical provider recommend that you make an appointment for your Annual Wellness Visit if you have not done so already this year.  You may set up this appointment before you leave today or you may call back ([phone]) and schedule an appointment.  Please make sure when you call that you mention that you are scheduling your Annual Wellness Visit with the clinical pharmacist so that the appointment may be made for the proper length of time.     Continue current medications. Continue good therapeutic lifestyle changes which include good diet and exercise. Fall precautions discussed with patient. If an FOBT was given today- please return it to our front desk. If you are over 79 years old - you may need Prevnar 31 or the adult Pneumonia vaccine.  Flu Shots are still available at our office. If you still haven't had one please call to set up a nurse visit to get one.   After your visit with [place] today you will receive a survey in the mail or online from [employer] regarding your care with [place]. Please take a moment to  fill this out. Your feedback is very important to [place] as you can help [place] better understand your patient needs as well as improve your experience and satisfaction. WE CARE ABOUT YOU!!!   Use warm wet compresses to the left shoulder 20 minutes 3 or 4 times daily Keep taking Tylenol arthritis as needed for pain We will call you with the results of the C-spine films and left shoulder films and depending on the findings may recommend he see a specialist Follow-up with cardiology today as planned  co

## 2014-12-03 NOTE — Patient Instructions (Signed)

## 2014-12-03 NOTE — Progress Notes (Signed)
Cardiology Office Note  Date: 12/03/2014   ID: Brianna [name], DOB 1925-07-29, MRN [id number]  PCP: Redge Gainer, MD  Primary Cardiologist: Rozann Lesches, MD   Chief Complaint  Patient presents with  . Mitral valve disease  . Hypertension    History of Present Illness: Brianna [name] is an 81 y.o. female last seen in August 2015. She presents for a routine follow-up visit. Fortunately, she does not endorse any significant change in shortness of breath, relatively mild at NYHA class II. Furthermore, no chest pain or palpitations. We continues conservative follow-up of mitral valve disease with previously documented moderate to severe mitral regurgitation. She is not inclined to pursue aggressive workup, or for that matter follow-up imaging studies so far.  He has been having some trouble with musculoskeletal left arm and neck pain. She states that she recently saw her primary care provider and got an injection. Blood pressure is elevated today.   Past Medical History  Diagnosis Date  . Mixed hyperlipidemia   . Lumbosacral spondylosis without myelopathy   . Fibrocystic breast   . Osteoporosis   . Symptomatic menopausal or female climacteric states   . Colon polyp   . Essential hypertension, benign   . Rectosigmoid cancer     T1N0, 2004  . PONV (postoperative nausea and vomiting)   . COPD (chronic obstructive pulmonary disease)   . Hypothyroidism   . Ventral hernia   . Bilateral inguinal hernia (BIH)   . Obturator hernia, right   . Myxomatous mitral valve     Mild prolapse with eccentric, moderate to severe regurgitation  . Carpal tunnel syndrome on right   . Frequency of urination   . History of skin cancer     Current Outpatient Prescriptions  Medication Sig Dispense Refill  . acetaminophen (TYLENOL) 500 MG tablet Take 500 mg by mouth every 6 (six) hours as needed. Pain      . aspirin 81 MG tablet Take 81 mg by mouth 3 (three) times a week.    Marland Kitchen atorvastatin  (LIPITOR) 10 MG tablet Take 1 tablet (10 mg total) by mouth at bedtime. (Patient taking differently: Take 5 mg by mouth at bedtime. ) 90 tablet 2  . beta carotene w/minerals (OCUVITE) tablet Take 1 tablet by mouth daily.      . Calcium Citrate-Vitamin D (CITRACAL + D PO) Take 2 tablets by mouth daily.     . Cholecalciferol (VITAMIN D3) 2000 UNITS TABS Take 1 tablet by mouth daily.     . Fish Oil OIL Take 5 mLs by mouth daily.    . hydrochlorothiazide (MICROZIDE) 12.5 MG capsule Take 1 capsule (12.5 mg total) by mouth daily. 90 capsule 6  . levothyroxine (SYNTHROID, LEVOTHROID) 25 MCG tablet Take 50 MG Monday through Friday and take 25 MG on Saturday and Sunday 180 tablet 3  . losartan (COZAAR) 100 MG tablet TAKE ONE TABLET BY MOUTH ONE TIME DAILY 90 tablet 1   No current facility-administered medications for this visit.    Allergies:  Alendronate sodium; Lodine; and Risedronate sodium   Social History: The patient  reports that she has never smoked. She does not have any smokeless tobacco history on file. She reports that she does not drink alcohol or use illicit drugs.   ROS:  Please see the history of present illness. Otherwise, complete review of systems is positive for none.  All other systems are reviewed and negative.    PHYSICAL EXAM: VS:  BP 161/75  mmHg  Pulse 86  Ht 5\' 1"  (1.549 m)  Wt 120 lb (54.432 kg)  BMI 22.69 kg/m2  SpO2 98%, BMI Body mass index is 22.69 kg/(m^2).  Wt Readings from Last 3 Encounters:  12/03/14 120 lb (54.432 kg)  12/03/14 120 lb (54.432 kg)  11/25/14 119 lb 8 oz (54.205 kg)     Patient in no acute distress, appears somewhat younger than stated age.  HEENT: Conjunctiva and lids normal, oropharynx clear.  Neck: Supple, no elevated JVP or carotid bruits, no thyromegaly.  Lungs: Clear to auscultation, nonlabored breathing at rest.  Cardiac: Regular rate and rhythm, no S3, 2/6 systolic murmur in mid systole mainly in the lateral thorax and even into  the back, no pericardial rub.  Abdomen: Soft, nontender, bowel sounds present.  Extremities: No pitting edema, distal pulses 2+.     ECG: ECG is not ordered today.   Recent Labwork: 04/13/2014: TSH 2.050 08/24/2014: ALT 18; AST 22; BUN 23; Creatinine 0.97; Potassium 4.3; Sodium 140 12/03/2014: Hemoglobin 11.8*     Component Value Date/Time   CHOL 186 08/24/2014 1230   TRIG 62 08/24/2014 1230   TRIG 60 04/13/2014 1120   HDL 72 08/24/2014 1230   HDL 83 04/13/2014 1120   HDL 63 03/25/2013 1255   CHOLHDL 2.3 04/13/2014 1120   CHOLHDL 2.7 03/25/2013 1255   VLDL 12 03/25/2013 1255   LDLCALC 95 04/13/2014 1120   LDLCALC 79 08/21/2013 0838   LDLCALC 92 03/25/2013 1255    Other Studies Reviewed Today:  Echocardiogram from April 2014 demonstrated mild LVH with LVEF 18-29%, grade 1 diastolic dysfunction, mildly calcified aortic valve with mild aortic regurgitation, myxomatous mitral valve with mild prolapse and moderate to severe eccentric mitral regurgitation, moderate left atrial enlargement, mild right atrial enlargement, mild tricuspid regurgitation with PASP 32 mmHg.  ASSESSMENT AND PLAN:  Mitral valve disease Symptomatically stable without change on examination. 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 Patient is not inclined to pursue this aggressively, and we have held off reimaging studies.. Followup in 6 months.   Essential hypertension, benign Continues on Cozaar and HCTZ. Blood pressure is elevated today. Keep follow-up with Dr. Laurance Flatten.     Current medicines are reviewed at length with the patient today.  The patient does not have concerns regarding medicines.   Disposition: FU with me in 6 months.   Signed, Satira Sark, MD, [facility] 12/03/2014 2:36 PM    [facility], [place], [place] [address] Phone: [phone]; Fax: [phone]

## 2014-12-03 NOTE — Assessment & Plan Note (Addendum)
Continues on Cozaar and HCTZ. Blood pressure is elevated today. Keep follow-up with Dr. Laurance Flatten.

## 2014-12-03 NOTE — Progress Notes (Signed)
Subjective:    Patient ID: Brianna [name], female    DOB: 06-Jan-1925, 81 y.o.   MRN: [id number]  HPI Pt here for follow up and management of chronic medical problems which include hypertension, hyperlipidemia, and anemia. She is taking medications regularly. The patient has been doing well overall and she only complains of some numbness and burning in her left arm down to her fingers. This is been hurting her for several months getting worse recently and is especially bad with pain in her shoulder and her hand at nighttime both hands hurt at night but the left is worse than the right.        Patient Active Problem List   Diagnosis Date Noted  . Osteoporosis, post-menopausal 11/25/2014  . Bilateral hand pain 04/13/2014  . Vitamin D deficiency 08/18/2013  . History of colon cancer 08/18/2013  . Mitral valve disease 05/27/2013  . Hyperlipidemia 03/25/2013  . Expected blood loss anemia 03/05/2013  . S/P right THA, AA 03/04/2013  . Essential hypertension, benign 02/16/2013  . Mixed hyperlipidemia 02/16/2013  . COPD (chronic obstructive pulmonary disease) 02/16/2013  . Arthritis 10/12/2011   Outpatient Encounter Prescriptions as of 12/03/2014  Medication Sig  . acetaminophen (TYLENOL) 500 MG tablet Take 500 mg by mouth every 6 (six) hours as needed. Pain    . aspirin 81 MG tablet Take 81 mg by mouth 3 (three) times a week.  Marland Kitchen atorvastatin (LIPITOR) 10 MG tablet Take 1 tablet (10 mg total) by mouth at bedtime. (Patient taking differently: Take 5 mg by mouth at bedtime. )  . beta carotene w/minerals (OCUVITE) tablet Take 1 tablet by mouth daily.    . Calcium Citrate-Vitamin D (CITRACAL + D PO) Take 2 tablets by mouth daily.   . Cholecalciferol (VITAMIN D3) 2000 UNITS TABS Take 1 tablet by mouth daily.   . Fish Oil OIL Take 5 mLs by mouth daily.  . hydrochlorothiazide (MICROZIDE) 12.5 MG capsule Take 1 capsule (12.5 mg total) by mouth daily.  Marland Kitchen levothyroxine (SYNTHROID, LEVOTHROID) 25  MCG tablet Take 50 MG Monday through Friday and take 25 MG on Saturday and Sunday  . losartan (COZAAR) 100 MG tablet TAKE ONE TABLET BY MOUTH ONE TIME DAILY    Review of Systems  Constitutional: Negative.   HENT: Negative.   Eyes: Negative.   Respiratory: Negative.   Cardiovascular: Negative.   Gastrointestinal: Negative.   Endocrine: Negative.   Genitourinary: Negative.   Musculoskeletal: Negative.   Skin: Negative.   Allergic/Immunologic: Negative.   Neurological: Positive for numbness (and burning of left arm, down to fingers).  Hematological: Negative.   Psychiatric/Behavioral: Negative.        Objective:   Physical Exam  Constitutional: She is oriented to person, place, and time. She appears well-developed and well-nourished. No distress.  The patient is alert and quiet but looks good for her age of 43 years.  HENT:  Head: Normocephalic and atraumatic.  Right Ear: External ear normal.  Left Ear: External ear normal.  Nose: Nose normal.  Mouth/Throat: Oropharynx is clear and moist.  Eyes: Conjunctivae and EOM are normal. Pupils are equal, round, and reactive to light. Right eye exhibits no discharge. Left eye exhibits no discharge. No scleral icterus.  Neck: Normal range of motion. Neck supple. No JVD present. No thyromegaly present.  No carotid bruits  Cardiovascular: Normal rate, normal heart sounds and intact distal pulses.  Exam reveals no gallop and no friction rub.   No murmur heard. The heart  is irregular irregular at 72/m  Pulmonary/Chest: Effort normal and breath sounds normal. No respiratory distress. She has no wheezes. She has no rales. She exhibits no tenderness.  Abdominal: Soft. Bowel sounds are normal. She exhibits no mass. There is no tenderness. There is no rebound and no guarding.  Tenderness or abdominal bruits  Musculoskeletal: Normal range of motion. She exhibits tenderness. She exhibits no edema.  The patient has limited range of motion of the left  arm secondary to pain in the left shoulder. There was point tenderness over the deltoid muscle insertion.  Lymphadenopathy:    She has no cervical adenopathy.  Neurological: She is alert and oriented to person, place, and time. She has normal reflexes. No cranial nerve deficit.  Skin: Skin is warm and dry. No rash noted.  Psychiatric: She has a normal mood and affect. Her behavior is normal. Judgment and thought content normal.  Nursing note and vitals reviewed.  BP 180/78 mmHg  Pulse 83  Temp(Src) 98.2 F (36.8 C) (Oral)  Ht 5' 0.5" (1.537 m)  Wt 120 lb (54.432 kg)  BMI 23.04 kg/m2  [facility] reading (PRIMARY) by  Dr. Louretta Parma shoulder and cervical spine--- no acute injury noted in the shoulder, the C-spine has severe degenerative changes with anterolisthesis of one vertebrae on another.                                  60 mg of Depo-Medrol were injected to the deltoid tendon insertion without complication.      Assessment & Plan:  1. Vitamin D deficiency -Continue to take vitamin D as directed and any changes that we'll be made will be dependent on lab work that is done today. - POCT CBC - Vit D  25 hydroxy (rtn osteoporosis monitoring)  2. Hyperlipidemia -Continue current treatment and aggressive therapeutic lifestyle changes - POCT CBC - Lipid panel  3. Essential hypertension, benign -Watch sodium intake and continue current treatment - POCT CBC - BMP8+EGFR - Hepatic function panel  4. Anemia, unspecified anemia type -We will call and discuss findings with today's lab work once those results are available - POCT CBC  5. Chronic obstructive pulmonary disease, unspecified COPD, unspecified chronic bronchitis type -The patient is doing well with this and no treatment changes are indicated - POCT CBC  6. Left arm pain -60 of Depo-Medrol was injected into the deltoid muscle insertion on the humerus -We will follow-up in 6 weeks with results. - DG Cervical Spine Complete;  Future - DG Shoulder Left; Future  7. Left arm numbness -We will look for any nerve impingement issues and the C-spine. - DG Cervical Spine Complete; Future - DG Shoulder Left; Future  8. Irregular heart rhythm -Follow up with cardiology as planned  Patient Instructions                       Medicare Annual Wellness Visit  [facility] and the medical providers at [facility] strive to bring you the best medical care.  In doing so we not only want to address your current medical conditions and concerns but also to detect new conditions early and prevent illness, disease and health-related problems.    Medicare offers a yearly Wellness Visit which allows our clinical staff to assess your need for preventative services including immunizations, lifestyle education, counseling to decrease risk of preventable diseases and screening for fall risk  and other medical concerns.    This visit is provided free of charge (no copay) for all Medicare recipients. The clinical pharmacists at [facility] have begun to conduct these Wellness Visits which will also include a thorough review of all your medications.    As you primary medical provider recommend that you make an appointment for your Annual Wellness Visit if you have not done so already this year.  You may set up this appointment before you leave today or you may call back ([phone]) and schedule an appointment.  Please make sure when you call that you mention that you are scheduling your Annual Wellness Visit with the clinical pharmacist so that the appointment may be made for the proper length of time.     Continue current medications. Continue good therapeutic lifestyle changes which include good diet and exercise. Fall precautions discussed with patient. If an FOBT was given today- please return it to our front desk. If you are over 24 years old - you may need Prevnar 63 or the adult Pneumonia  vaccine.  Flu Shots are still available at our office. If you still haven't had one please call to set up a nurse visit to get one.   After your visit with [place] today you will receive a survey in the mail or online from [employer] regarding your care with [place]. Please take a moment to fill this out. Your feedback is very important to [place] as you can help [place] better understand your patient needs as well as improve your experience and satisfaction. WE CARE ABOUT YOU!!!   Use warm wet compresses to the left shoulder 20 minutes 3 or 4 times daily Keep taking Tylenol arthritis as needed for pain We will call you with the results of the C-spine films and left shoulder films and depending on the findings may recommend he see a specialist Follow-up with cardiology today as planned   Arrie Senate MD

## 2014-12-03 NOTE — Assessment & Plan Note (Addendum)
Symptomatically stable without change on examination. 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 Patient is not inclined to pursue this aggressively, and we have held off reimaging studies.. Followup in 6 months.

## 2014-12-04 LAB — BMP8+EGFR
BUN/Creatinine Ratio: 24 (ref 11–26)
BUN: 27 mg/dL (ref 8–27)
CO2: 26 mmol/L (ref 18–29)
Calcium: 9.7 mg/dL (ref 8.7–10.3)
Chloride: 98 mmol/L (ref 97–108)
Creatinine, Ser: 1.14 mg/dL — ABNORMAL HIGH (ref 0.57–1.00)
GFR calc Af Amer: 50 mL/min/{1.73_m2} — ABNORMAL LOW (ref >59)
GFR, EST NON AFRICAN AMERICAN: 43 mL/min/{1.73_m2} — AB (ref >59)
GLUCOSE: 86 mg/dL (ref 65–99)
Potassium: 4.4 mmol/L (ref 3.5–5.2)
Sodium: 138 mmol/L (ref 134–144)

## 2014-12-04 LAB — HEPATIC FUNCTION PANEL
ALT: 12 IU/L (ref 0–32)
AST: 21 IU/L (ref 0–40)
Albumin: 4.2 g/dL (ref 3.5–4.7)
Alkaline Phosphatase: 61 IU/L (ref 39–117)
Bilirubin, Direct: 0.09 mg/dL (ref 0.00–0.40)
Total Bilirubin: 0.3 mg/dL (ref 0.0–1.2)
Total Protein: 7 g/dL (ref 6.0–8.5)

## 2014-12-04 LAB — VITAMIN D 25 HYDROXY (VIT D DEFICIENCY, FRACTURES): Vit D, 25-Hydroxy: 66 ng/mL (ref 30.0–100.0)

## 2014-12-04 LAB — LIPID PANEL
CHOLESTEROL TOTAL: 189 mg/dL (ref 100–199)
Chol/HDL Ratio: 2.4 ratio units (ref 0.0–4.4)
HDL: 80 mg/dL (ref >39)
LDL Calculated: 92 mg/dL (ref 0–99)
TRIGLYCERIDES: 87 mg/dL (ref 0–149)
VLDL Cholesterol Cal: 17 mg/dL (ref 5–40)

## 2014-12-04 NOTE — Addendum Note (Signed)
Addended by: Zannie Cove on: 12/04/2014 11:14 AM   Modules accepted: Orders

## 2014-12-17 ENCOUNTER — Ambulatory Visit (HOSPITAL_COMMUNITY)
Admission: RE | Admit: 2014-12-17 | Discharge: 2014-12-17 | Disposition: A | Discharge status: Home / Self Care | Payer: Medicare Other | Source: Ambulatory Visit | Attending: Family Medicine | Admitting: Family Medicine

## 2014-12-17 DIAGNOSIS — M5030 Other cervical disc degeneration, unspecified cervical region: Secondary | ICD-10-CM | POA: Diagnosis not present

## 2014-12-17 DIAGNOSIS — M79602 Pain in left arm: Secondary | ICD-10-CM | POA: Diagnosis present

## 2014-12-17 DIAGNOSIS — R208 Other disturbances of skin sensation: Secondary | ICD-10-CM | POA: Diagnosis not present

## 2014-12-17 DIAGNOSIS — R200 Anesthesia of skin: Secondary | ICD-10-CM

## 2014-12-17 DIAGNOSIS — M4310 Spondylolisthesis, site unspecified: Secondary | ICD-10-CM

## 2015-01-04 ENCOUNTER — Telehealth: Payer: Self-pay | Admitting: Family Medicine

## 2015-01-04 MED ORDER — LEVOTHYROXINE SODIUM 25 MCG PO TABS: ORAL_TABLET | ORAL | Status: DC

## 2015-01-04 MED ORDER — ATORVASTATIN CALCIUM 10 MG PO TABS: 5.0000 mg | ORAL_TABLET | Freq: Every day | ORAL | Status: DC

## 2015-01-04 NOTE — Telephone Encounter (Signed)
done

## 2015-01-07 ENCOUNTER — Telehealth: Payer: Self-pay | Admitting: Family Medicine

## 2015-01-14 ENCOUNTER — Ambulatory Visit (INDEPENDENT_AMBULATORY_CARE_PROVIDER_SITE_OTHER): Payer: Medicare Other | Admitting: Family Medicine

## 2015-01-14 ENCOUNTER — Encounter: Payer: Self-pay | Admitting: Family Medicine

## 2015-01-14 VITALS — BP 147/68 | HR 43 | Temp 97.1°F | Ht 60.5 in | Wt 120.0 lb

## 2015-01-14 DIAGNOSIS — G629 Polyneuropathy, unspecified: Secondary | ICD-10-CM | POA: Diagnosis not present

## 2015-01-14 DIAGNOSIS — M79602 Pain in left arm: Secondary | ICD-10-CM

## 2015-01-14 DIAGNOSIS — M542 Cervicalgia: Secondary | ICD-10-CM

## 2015-01-14 DIAGNOSIS — R937 Abnormal findings on diagnostic imaging of other parts of musculoskeletal system: Secondary | ICD-10-CM | POA: Diagnosis not present

## 2015-01-14 NOTE — Progress Notes (Signed)
Subjective:    Patient ID: Brianna [name], female    DOB: 06/26/1925, 81 y.o.   MRN: [id number]  HPI Patient here today for 6 week follow up on left shoulder pain and MRI of c-spine. The patient is still having some pain in the left shoulder shot of Depo-Medrol that we put in there recently help some she is still having pain.        Patient Active Problem List   Diagnosis Date Noted  . Osteoporosis, post-menopausal 11/25/2014  . Bilateral hand pain 04/13/2014  . Vitamin D deficiency 08/18/2013  . History of colon cancer 08/18/2013  . Mitral valve disease 05/27/2013  . Hyperlipidemia 03/25/2013  . Expected blood loss anemia 03/05/2013  . S/P right THA, AA 03/04/2013  . Essential hypertension, benign 02/16/2013  . Mixed hyperlipidemia 02/16/2013  . COPD (chronic obstructive pulmonary disease) 02/16/2013  . Arthritis 10/12/2011   Outpatient Encounter Prescriptions as of 01/14/2015  Medication Sig  . acetaminophen (TYLENOL) 500 MG tablet Take 500 mg by mouth every 6 (six) hours as needed. Pain    . aspirin 81 MG tablet Take 81 mg by mouth 3 (three) times a week.  Marland Kitchen atorvastatin (LIPITOR) 10 MG tablet Take 0.5 tablets (5 mg total) by mouth at bedtime.  . beta carotene w/minerals (OCUVITE) tablet Take 1 tablet by mouth daily.    . Calcium Citrate-Vitamin D (CITRACAL + D PO) Take 2 tablets by mouth daily.   . Cholecalciferol (VITAMIN D3) 2000 UNITS TABS Take 1 tablet by mouth daily.   . Fish Oil OIL Take 5 mLs by mouth daily.  . hydrochlorothiazide (MICROZIDE) 12.5 MG capsule Take 1 capsule (12.5 mg total) by mouth daily.  Marland Kitchen levothyroxine (SYNTHROID, LEVOTHROID) 25 MCG tablet Take 50 MG Monday through Friday and take 25 MG on Saturday and Sunday  . losartan (COZAAR) 100 MG tablet TAKE ONE TABLET BY MOUTH ONE TIME DAILY    Review of Systems  Constitutional: Negative.   HENT: Negative.   Eyes: Negative.   Respiratory: Negative.   Cardiovascular: Negative.   Gastrointestinal:  Negative.   Endocrine: Negative.   Genitourinary: Negative.   Musculoskeletal: Positive for arthralgias (left shoulder pain - is better since the shot we gave at last OV).  Skin: Negative.   Allergic/Immunologic: Negative.   Neurological: Negative.   Hematological: Negative.   Psychiatric/Behavioral: Negative.        Objective:   Physical Exam  BP 147/68 mmHg  Pulse 43  Temp(Src) 97.1 F (36.2 C) (Oral)  Ht 5' 0.5" (1.537 m)  Wt 120 lb (54.432 kg)  BMI 23.04 kg/m2  The patient continues to have pain in the left shoulder area and the nerve root distribution of C4. The MRI result was reviewed with the patient. She was given a copy of the report. She seems to understand that there was neuro foraminal narrowing affecting nerve roots going to her left shoulder. We will arrange to talk to her daughter and schedule her for a visit with Dr. Carloyn Manner.     Assessment & Plan:  1. Left arm pain -Abnormal MRI and referral to neurosurgeon for possible injections  2. Neck pain on left side -Abnormal MRI and refer to neurosurgeon for possible injections  3. Neuropathy -As above  4. Abnormal MRI, cervical spine -This was explained to the patient and she understands the need for referral. She was given a copy of the report and before she sees the neurosurgeon will make sure that she has a  copy of the CD so that he can look at that also.  Patient Instructions  We will arrange for you to have an appointment with Dr. Carloyn Manner a to follow-up on the abnormal MRI and the associated pain that you're having at the C4 and C6 neural foraminal openings.   Arrie Senate MD

## 2015-01-14 NOTE — Patient Instructions (Signed)
We will arrange for you to have an appointment with Dr. Carloyn Manner a to follow-up on the abnormal MRI and the associated pain that you're having at the C4 and C6 neural foraminal openings.

## 2015-01-28 ENCOUNTER — Telehealth: Payer: Self-pay | Admitting: Family Medicine

## 2015-01-29 ENCOUNTER — Telehealth: Payer: Self-pay

## 2015-01-29 DIAGNOSIS — R937 Abnormal findings on diagnostic imaging of other parts of musculoskeletal system: Secondary | ICD-10-CM

## 2015-01-29 NOTE — Telephone Encounter (Signed)
Wants a referral to Dr Carloyn Manner in [place]

## 2015-01-29 NOTE — Telephone Encounter (Signed)
Please do a referral to Dr. Carloyn Manner in [place] because of abnormal MRI of the cervical spine and neuropathy

## 2015-03-01 ENCOUNTER — Other Ambulatory Visit: Payer: Self-pay | Admitting: Family Medicine

## 2015-04-05 ENCOUNTER — Other Ambulatory Visit: Payer: Self-pay

## 2015-04-05 MED ORDER — LEVOTHYROXINE SODIUM 25 MCG PO TABS: ORAL_TABLET | ORAL | Status: DC

## 2015-04-05 NOTE — Telephone Encounter (Signed)
Last seen 01/14/15 [facility]  Last thyroid 04/13/14  This is mail order 90 day s supply

## 2015-04-05 NOTE — Telephone Encounter (Signed)
Refill medication for 1 year, get thyroid profile with TSH

## 2015-04-08 ENCOUNTER — Ambulatory Visit (INDEPENDENT_AMBULATORY_CARE_PROVIDER_SITE_OTHER): Payer: Medicare Other

## 2015-04-08 ENCOUNTER — Ambulatory Visit (INDEPENDENT_AMBULATORY_CARE_PROVIDER_SITE_OTHER): Payer: Medicare Other | Admitting: Family Medicine

## 2015-04-08 ENCOUNTER — Encounter: Payer: Self-pay | Admitting: Family Medicine

## 2015-04-08 VITALS — BP 148/89 | HR 68 | Temp 97.2°F | Ht 60.5 in | Wt 121.0 lb

## 2015-04-08 DIAGNOSIS — R202 Paresthesia of skin: Secondary | ICD-10-CM | POA: Diagnosis not present

## 2015-04-08 DIAGNOSIS — I10 Essential (primary) hypertension: Secondary | ICD-10-CM

## 2015-04-08 DIAGNOSIS — I700 Atherosclerosis of aorta: Secondary | ICD-10-CM

## 2015-04-08 DIAGNOSIS — D649 Anemia, unspecified: Secondary | ICD-10-CM

## 2015-04-08 DIAGNOSIS — E785 Hyperlipidemia, unspecified: Secondary | ICD-10-CM

## 2015-04-08 DIAGNOSIS — M19041 Primary osteoarthritis, right hand: Secondary | ICD-10-CM | POA: Diagnosis not present

## 2015-04-08 DIAGNOSIS — E559 Vitamin D deficiency, unspecified: Secondary | ICD-10-CM | POA: Diagnosis not present

## 2015-04-08 DIAGNOSIS — M25512 Pain in left shoulder: Secondary | ICD-10-CM | POA: Diagnosis not present

## 2015-04-08 DIAGNOSIS — R200 Anesthesia of skin: Secondary | ICD-10-CM

## 2015-04-08 DIAGNOSIS — M19042 Primary osteoarthritis, left hand: Secondary | ICD-10-CM | POA: Diagnosis not present

## 2015-04-08 DIAGNOSIS — J449 Chronic obstructive pulmonary disease, unspecified: Secondary | ICD-10-CM | POA: Diagnosis not present

## 2015-04-08 DIAGNOSIS — I517 Cardiomegaly: Secondary | ICD-10-CM

## 2015-04-08 LAB — POCT CBC
Granulocyte percent: 56.9 %G (ref 37–80)
HCT, POC: 39.8 % (ref 37.7–47.9)
Hemoglobin: 12.7 g/dL (ref 12.2–16.2)
LYMPH, POC: 1.7 (ref 0.6–3.4)
MCH, POC: 30.2 pg (ref 27–31.2)
MCHC: 31.9 g/dL (ref 31.8–35.4)
MCV: 94.6 fL (ref 80–97)
MPV: 8.6 fL (ref 0–99.8)
PLATELET COUNT, POC: 190 10*3/uL (ref 142–424)
POC Granulocyte: 2.8 (ref 2–6.9)
POC LYMPH %: 33.7 % (ref 10–50)
RBC: 4.21 M/uL (ref 4.04–5.48)
RDW, POC: 13.3 %
WBC: 5 10*3/uL (ref 4.6–10.2)

## 2015-04-08 NOTE — Patient Instructions (Addendum)
Medicare Annual Wellness Visit  [facility] and the medical providers at [facility] strive to bring you the best medical care.  In doing so we not only want to address your current medical conditions and concerns but also to detect new conditions early and prevent illness, disease and health-related problems.    Medicare offers a yearly Wellness Visit which allows our clinical staff to assess your need for preventative services including immunizations, lifestyle education, counseling to decrease risk of preventable diseases and screening for fall risk and other medical concerns.    This visit is provided free of charge (no copay) for all Medicare recipients. The clinical pharmacists at [facility] have begun to conduct these Wellness Visits which will also include a thorough review of all your medications.    As you primary medical provider recommend that you make an appointment for your Annual Wellness Visit if you have not done so already this year.  You may set up this appointment before you leave today or you may call back ([phone]) and schedule an appointment.  Please make sure when you call that you mention that you are scheduling your Annual Wellness Visit with the clinical pharmacist so that the appointment may be made for the proper length of time.     Continue current medications. Continue good therapeutic lifestyle changes which include good diet and exercise. Fall precautions discussed with patient. If an FOBT was given today- please return it to our front desk. If you are over 85 years old - you may need Prevnar 20 or the adult Pneumonia vaccine.  Flu Shots are still available at our office. If you still haven't had one please call to set up a nurse visit to get one.   After your visit with [place] today you will receive a survey in the mail or online from [employer] regarding your care with [place]. Please take a moment to  fill this out. Your feedback is very important to [place] as you can help [place] better understand your patient needs as well as improve your experience and satisfaction. WE CARE ABOUT YOU!!!   The patient should follow up with Dr. Christy Sartorius as planned because of the left shoulder pain and the numbness and tingling in the left hand which is most likely carpal tunnel syndrome. We will call her with the results of the chest x-ray in the lab work as soon as it becomes available She should continue with current treatment She should check her blood pressures at home at least 3 or 4 times monthly and bring these readings in to the next visit for [place] to review Also, keep appointment with Dr. Domenic Polite the cardiologist as planned--- take any blood pressure readings with you to his office when you see him for review also

## 2015-04-08 NOTE — Progress Notes (Signed)
Subjective:    Patient ID: Brianna [name], female    DOB: [birth year], 81 y.o.   MRN: [id number]  HPI Pt here for follow up and management of chronic medical problems which includes hypertension, hyperlipidemia, and anemia. She is taking medications regularly. Patient does complain of left shoulder pain and arm pain with numbness and has an appointment with Dr. Carloyn Manner in July. The patient has worked in [employer] for many years. She has a lot of arthralgias in both hands but especially the numbness in the left hand. She denies chest pain shortness of breath trouble swallowing or any symptoms with her GI tract including blood in the stool or black tarry bowel movements. She had a colon cancer resected in the past. She denies any problems with voiding other than just frequency. She does drink plenty of fluids. A copy of the MRI of her cervical spine was given to her to take with her visit to Dr. Carloyn Manner. She has cervical disc disease and most likely carpal tunnel syndrome.      Patient Active Problem List   Diagnosis Date Noted  . Osteoporosis, post-menopausal 11/25/2014  . Bilateral hand pain 04/13/2014  . Vitamin D deficiency 08/18/2013  . History of colon cancer 08/18/2013  . Mitral valve disease 05/27/2013  . Hyperlipidemia 03/25/2013  . Expected blood loss anemia 03/05/2013  . S/P right THA, AA 03/04/2013  . Essential hypertension, benign 02/16/2013  . Mixed hyperlipidemia 02/16/2013  . COPD (chronic obstructive pulmonary disease) 02/16/2013  . Arthritis 10/12/2011   Outpatient Encounter Prescriptions as of 04/08/2015  Medication Sig  . acetaminophen (TYLENOL) 500 MG tablet Take 500 mg by mouth every 6 (six) hours as needed. Pain    . aspirin 81 MG tablet Take 81 mg by mouth 3 (three) times a week.  Marland Kitchen atorvastatin (LIPITOR) 10 MG tablet Take 0.5 tablets (5 mg total) by mouth at bedtime.  . beta carotene w/minerals (OCUVITE) tablet Take 1 tablet by mouth daily.    . Calcium Citrate-Vitamin  D (CITRACAL + D PO) Take 2 tablets by mouth daily.   . Cholecalciferol (VITAMIN D3) 2000 UNITS TABS Take 1 tablet by mouth daily.   . Fish Oil OIL Take 5 mLs by mouth daily.  . hydrochlorothiazide (MICROZIDE) 12.5 MG capsule Take 1 capsule (12.5 mg total) by mouth daily.  Marland Kitchen levothyroxine (SYNTHROID, LEVOTHROID) 25 MCG tablet Take 50 MG Monday through Friday and take 25 MG on Saturday and Sunday  . losartan (COZAAR) 100 MG tablet TAKE ONE TABLET BY MOUTH ONE TIME DAILY   No facility-administered encounter medications on file as of 04/08/2015.      Review of Systems  Constitutional: Negative.   HENT: Negative.   Eyes: Negative.   Respiratory: Negative.   Cardiovascular: Negative.   Gastrointestinal: Negative.   Endocrine: Negative.   Genitourinary: Negative.   Musculoskeletal: Positive for arthralgias (left shoulder pain and arm numbness - Dr Carloyn Manner appt in July).  Skin: Negative.   Allergic/Immunologic: Negative.   Neurological: Negative.   Hematological: Negative.   Psychiatric/Behavioral: Negative.        Objective:   Physical Exam  Constitutional: She is oriented to person, place, and time. She appears well-developed and well-nourished. No distress.  The patient is pleasant and alert and looks much younger than her stated age of 3 years  HENT:  Head: Normocephalic and atraumatic.  Nose: Nose normal.  Mouth/Throat: Oropharynx is clear and moist.  The patient has ear cerumen bilaterally and this was successfully  removed with irrigation  Eyes: Conjunctivae and EOM are normal. Pupils are equal, round, and reactive to light. Right eye exhibits no discharge. Left eye exhibits no discharge. No scleral icterus.  Neck: Normal range of motion. Neck supple. No thyromegaly present.  The neck was without bruits thyromegaly or adenopathy  Cardiovascular: Normal rate, normal heart sounds and intact distal pulses.   No murmur heard. The heart was irregular at 72/m. she has a history of  premature atrial contractions.  Pulmonary/Chest: Effort normal and breath sounds normal. No respiratory distress. She has no wheezes. She has no rales. She exhibits no tenderness.  Clear anteriorly and posteriorly  Abdominal: Soft. Bowel sounds are normal. She exhibits no mass. There is no tenderness. There is no rebound and no guarding.  The abdomen was nontender without masses or organomegaly or inguinal adenopathy  Musculoskeletal: Normal range of motion. She exhibits no edema or tenderness.  She has a history of a right hip replacement but is doing well with her mobility and no obvious abnormalities were noted with extremity movement  Lymphadenopathy:    She has no cervical adenopathy.  Neurological: She is alert and oriented to person, place, and time. She has normal reflexes. No cranial nerve deficit.  Skin: Skin is warm and dry. No rash noted.  Psychiatric: She has a normal mood and affect. Her behavior is normal. Judgment and thought content normal.  Nursing note and vitals reviewed.  BP 148/89 mmHg  Pulse 68  Temp(Src) 97.2 F (36.2 C) (Oral)  Ht 5' 0.5" (1.537 m)  Wt 121 lb (54.885 kg)  BMI 23.23 kg/m2  [facility] reading (PRIMARY) by  DrMoore-chest x-ray--enlarged heart and atherosclerosis of the thoracic aorta                                        Assessment & Plan:  1. Vitamin D deficiency -Continue vitamin D replacement pending results of lab work - POCT CBC - Vit D  25 hydroxy (rtn osteoporosis monitoring)  2. Hyperlipidemia -Continue atorvastatin pending results of lipid panel - POCT CBC - Lipid panel - DG Chest 2 View; Future  3. Essential hypertension, benign -Continue blood pressure treatment and sodium restriction and bring readings in for review as directed - POCT CBC - BMP8+EGFR - Hepatic function panel - DG Chest 2 View; Future  4. Anemia, unspecified anemia type -The patient is having no signs or symptoms of any blood loss and we will await results  of the CBC to determine if any treatment is needed - POCT CBC  5. Chronic obstructive pulmonary disease, unspecified COPD, unspecified chronic bronchitis type -She is doing well with her breathing and having no signs or shortness of breath or symptoms related to this. - POCT CBC - DG Chest 2 View; Future  6. Numbness and tingling in left hand -She should follow-up with Dr. Carloyn Manner as planned  7. Left shoulder pain -She has cervical spine disc disease and this is most likely contributing to her left shoulder pain  8. Primary osteoarthritis of both hands -The patient has DIP joint deformities in both hands  Patient Instructions                       Medicare Annual Wellness Visit  [facility] and the medical providers at [facility] strive to bring you the best medical care.  In doing  so we not only want to address your current medical conditions and concerns but also to detect new conditions early and prevent illness, disease and health-related problems.    Medicare offers a yearly Wellness Visit which allows our clinical staff to assess your need for preventative services including immunizations, lifestyle education, counseling to decrease risk of preventable diseases and screening for fall risk and other medical concerns.    This visit is provided free of charge (no copay) for all Medicare recipients. The clinical pharmacists at [facility] have begun to conduct these Wellness Visits which will also include a thorough review of all your medications.    As you primary medical provider recommend that you make an appointment for your Annual Wellness Visit if you have not done so already this year.  You may set up this appointment before you leave today or you may call back ([phone]) and schedule an appointment.  Please make sure when you call that you mention that you are scheduling your Annual Wellness Visit with the clinical pharmacist so that the  appointment may be made for the proper length of time.     Continue current medications. Continue good therapeutic lifestyle changes which include good diet and exercise. Fall precautions discussed with patient. If an FOBT was given today- please return it to our front desk. If you are over 67 years old - you may need Prevnar 32 or the adult Pneumonia vaccine.  Flu Shots are still available at our office. If you still haven't had one please call to set up a nurse visit to get one.   After your visit with [place] today you will receive a survey in the mail or online from [employer] regarding your care with [place]. Please take a moment to fill this out. Your feedback is very important to [place] as you can help [place] better understand your patient needs as well as improve your experience and satisfaction. WE CARE ABOUT YOU!!!   The patient should follow up with Dr. Christy Sartorius as planned because of the left shoulder pain and the numbness and tingling in the left hand which is most likely carpal tunnel syndrome. We will call her with the results of the chest x-ray in the lab work as soon as it becomes available She should continue with current treatment She should check her blood pressures at home at least 3 or 4 times monthly and bring these readings in to the next visit for [place] to review Also, keep appointment with Dr. Domenic Polite the cardiologist as planned--- take any blood pressure readings with you to his office when you see him for review also   Arrie Senate MD

## 2015-04-09 LAB — BMP8+EGFR
BUN/Creatinine Ratio: 32 — ABNORMAL HIGH (ref 11–26)
BUN: 30 mg/dL — ABNORMAL HIGH (ref 8–27)
CO2: 27 mmol/L (ref 18–29)
CREATININE: 0.95 mg/dL (ref 0.57–1.00)
Calcium: 10.3 mg/dL (ref 8.7–10.3)
Chloride: 99 mmol/L (ref 97–108)
GFR calc Af Amer: 62 mL/min/{1.73_m2} (ref >59)
GFR calc non Af Amer: 54 mL/min/{1.73_m2} — ABNORMAL LOW (ref >59)
Glucose: 90 mg/dL (ref 65–99)
Potassium: 4.4 mmol/L (ref 3.5–5.2)
Sodium: 140 mmol/L (ref 134–144)

## 2015-04-09 LAB — HEPATIC FUNCTION PANEL
ALT: 11 IU/L (ref 0–32)
AST: 20 IU/L (ref 0–40)
Albumin: 4.4 g/dL (ref 3.5–4.7)
Alkaline Phosphatase: 61 IU/L (ref 39–117)
Bilirubin Total: 0.4 mg/dL (ref 0.0–1.2)
Bilirubin, Direct: 0.12 mg/dL (ref 0.00–0.40)
TOTAL PROTEIN: 6.9 g/dL (ref 6.0–8.5)

## 2015-04-09 LAB — LIPID PANEL
CHOLESTEROL TOTAL: 199 mg/dL (ref 100–199)
Chol/HDL Ratio: 2.3 ratio units (ref 0.0–4.4)
HDL: 86 mg/dL (ref >39)
LDL Calculated: 102 mg/dL — ABNORMAL HIGH (ref 0–99)
TRIGLYCERIDES: 54 mg/dL (ref 0–149)
VLDL Cholesterol Cal: 11 mg/dL (ref 5–40)

## 2015-04-09 LAB — VITAMIN D 25 HYDROXY (VIT D DEFICIENCY, FRACTURES): Vit D, 25-Hydroxy: 63.7 ng/mL (ref 30.0–100.0)

## 2015-04-26 ENCOUNTER — Other Ambulatory Visit: Payer: Self-pay

## 2015-04-26 ENCOUNTER — Telehealth: Payer: Self-pay | Admitting: Family Medicine

## 2015-04-26 MED ORDER — ATORVASTATIN CALCIUM 10 MG PO TABS: 5.0000 mg | ORAL_TABLET | Freq: Every day | ORAL | Status: DC

## 2015-05-26 ENCOUNTER — Other Ambulatory Visit: Payer: Self-pay | Admitting: Family Medicine

## 2015-06-14 ENCOUNTER — Encounter: Payer: Self-pay | Admitting: Cardiology

## 2015-06-14 ENCOUNTER — Ambulatory Visit (INDEPENDENT_AMBULATORY_CARE_PROVIDER_SITE_OTHER): Payer: Medicare Other | Admitting: Cardiology

## 2015-06-14 VITALS — BP 157/72 | HR 86 | Ht 61.0 in | Wt 121.0 lb

## 2015-06-14 DIAGNOSIS — I10 Essential (primary) hypertension: Secondary | ICD-10-CM | POA: Diagnosis not present

## 2015-06-14 DIAGNOSIS — I059 Rheumatic mitral valve disease, unspecified: Secondary | ICD-10-CM

## 2015-06-14 NOTE — Progress Notes (Signed)
Cardiology Office Note  Date: 06/14/2015   ID: KIMBERLIN SCHEEL, DOB 1924-11-23, MRN [id number]  PCP: Redge Gainer, MD  Primary Cardiologist: Rozann Lesches, MD   Chief Complaint  Patient presents with  . Mitral valve disease  . Hypertension    History of Present Illness: Brianna [name] is an 82 y.o. female last seen in February. She presents for a routine follow-up visit. From a cardiac perspective, she reports stable NYHA class II dyspnea, no palpitations or syncope.  We have continued conservative management of mitral valve disease including previously documented moderate to severe mitral regurgitation in 2014. She has not been inclined to pursue aggressive workup or follow-up imaging studies.  She continues to follow with Dr. Laurance Flatten.  She has had trouble with hand arthritis and also carpal tunnel syndrome. She is using a brace at nighttime on her wrists. It does not sound like surgery is planned.   Past Medical History  Diagnosis Date  . Mixed hyperlipidemia   . Lumbosacral spondylosis without myelopathy   . Fibrocystic breast   . Osteoporosis   . Symptomatic menopausal or female climacteric states   . Colon polyp   . Essential hypertension, benign   . Rectosigmoid cancer     T1N0, 2004  . PONV (postoperative nausea and vomiting)   . COPD (chronic obstructive pulmonary disease)   . Hypothyroidism   . Ventral hernia   . Bilateral inguinal hernia (BIH)   . Obturator hernia, right   . Myxomatous mitral valve     Mild prolapse with eccentric, moderate to severe regurgitation  . Carpal tunnel syndrome on right   . Frequency of urination   . History of skin cancer      Current Outpatient Prescriptions  Medication Sig Dispense Refill  . acetaminophen (TYLENOL) 500 MG tablet Take 500 mg by mouth every 6 (six) hours as needed. Pain      . aspirin 81 MG tablet Take 81 mg by mouth 3 (three) times a week.    Marland Kitchen atorvastatin (LIPITOR) 10 MG tablet Take 0.5 tablets (5  mg total) by mouth at bedtime. 90 tablet 0  . beta carotene w/minerals (OCUVITE) tablet Take 1 tablet by mouth daily.      . Calcium Citrate-Vitamin D (CITRACAL + D PO) Take 2 tablets by mouth daily.     . Cholecalciferol (VITAMIN D3) 2000 UNITS TABS Take 1 tablet by mouth daily.     . hydrochlorothiazide (MICROZIDE) 12.5 MG capsule Take 1 capsule (12.5 mg total) by mouth daily. 90 capsule 6  . levothyroxine (SYNTHROID, LEVOTHROID) 25 MCG tablet Take 50 MG Monday through Friday and take 25 MG on Saturday and Sunday 180 tablet 0  . losartan (COZAAR) 100 MG tablet TAKE ONE TABLET BY MOUTH ONE TIME DAILY 90 tablet 1  . Omega-3 Fatty Acids (FISH OIL PO) Take 1 tablet by mouth daily.     No current facility-administered medications for this visit.    Allergies:  Alendronate sodium; Lodine; and Risedronate sodium   Social History: The patient  reports that she has never smoked. She does not have any smokeless tobacco history on file. She reports that she does not drink alcohol or use illicit drugs.   ROS:  Please see the history of present illness. Otherwise, complete review of systems is positive for none.  All other systems are reviewed and negative.   Physical Exam: VS:  BP 157/72 mmHg  Pulse 86  Ht 5\' 1"  (1.549 m)  Wt  121 lb (54.885 kg)  BMI 22.87 kg/m2  SpO2 99%, BMI Body mass index is 22.87 kg/(m^2).  Wt Readings from Last 3 Encounters:  06/14/15 121 lb (54.885 kg)  04/08/15 121 lb (54.885 kg)  01/14/15 120 lb (54.432 kg)     Patient in no acute distress.  HEENT: Conjunctiva and lids normal, oropharynx clear.  Neck: Supple, no elevated JVP or carotid bruits, no thyromegaly.  Lungs: Clear to auscultation, nonlabored breathing at rest.  Cardiac: Regular rate and rhythm, no S3, 2/6 systolic murmur in mid systole mainly in the lateral thorax and into the back, no pericardial rub.  Abdomen: Soft, nontender, bowel sounds present.  Extremities: No pitting edema, distal pulses  2+.    ECG: ECG is ordered today and shows sinus rhythm with PACs and PVCs.   Recent Labwork: 04/08/2015: ALT 11; AST 20; BUN 30*; Creatinine, Ser 0.95; Hemoglobin 12.7; Potassium 4.4; Sodium 140     Component Value Date/Time   CHOL 199 04/08/2015 1125   CHOL 186 08/24/2014 1230   TRIG 54 04/08/2015 1125   TRIG 62 08/24/2014 1230   HDL 86 04/08/2015 1125   HDL 72 08/24/2014 1230   HDL 63 03/25/2013 1255   CHOLHDL 2.3 04/08/2015 1125   CHOLHDL 2.7 03/25/2013 1255   VLDL 12 03/25/2013 1255   LDLCALC 102* 04/08/2015 1125   LDLCALC 79 08/21/2013 0838   LDLCALC 92 03/25/2013 1255    Other Studies Reviewed Today:  Echocardiogram from April 2014 demonstrated mild LVH with LVEF 53-00%, grade 1 diastolic dysfunction, mildly calcified aortic valve with mild aortic regurgitation, myxomatous mitral valve with mild prolapse and moderate to severe eccentric mitral regurgitation, moderate left atrial enlargement, mild right atrial enlargement, mild tricuspid regurgitation with PASP 32 mmHg.  Assessment and Plan:  1. Symptomatically stable mitral valve disease with prior documentation of moderate to severe mitral regurgitation and myxomatous valve. She prefers overall conservative management, we will continue observation for now.  2. Essential hypertension, no change in current regimen. Keep follow-up with Dr. Laurance Flatten.  Current medicines were reviewed with the patient today.   Orders Placed This Encounter  Procedures  . EKG 12-Lead    Disposition: FU with me in 6 months.   Signed, Satira Sark, MD, [facility] 06/14/2015 10:15 AM    [facility] [place], [place], [place] [address] Phone: [phone]; Fax: [phone]

## 2015-06-14 NOTE — Patient Instructions (Signed)
Your physician recommends that you continue on your current medications as directed. Please refer to the Current Medication list given to you today. Your physician recommends that you schedule a follow-up appointment in: 6 months. You will receive a reminder letter in the mail in about 4 months reminding you to call and schedule your appointment. If you don't receive this letter, please contact our office. 

## 2015-06-16 ENCOUNTER — Other Ambulatory Visit: Payer: Self-pay | Admitting: *Deleted

## 2015-06-16 MED ORDER — HYDROCHLOROTHIAZIDE 12.5 MG PO CAPS: 12.5000 mg | ORAL_CAPSULE | Freq: Every day | ORAL | Status: DC

## 2015-07-08 ENCOUNTER — Telehealth: Payer: Self-pay | Admitting: Pharmacist

## 2015-07-08 NOTE — Telephone Encounter (Signed)
I had spoken with patient earlier this year about starting medication for osteoporosis.  Looked into cost for Actonel which was $6/month, Evista $6/month and Prolia $170/6 months.  At that time she was concerned about back pain and was referred to neurosurgeon.  She did not want to start any new medications until after this consult.  I called her today to see how she was doing and re evaluate openness to starting something for her bones.  No answer - left message.

## 2015-07-28 ENCOUNTER — Telehealth: Payer: Self-pay | Admitting: Family Medicine

## 2015-07-29 MED ORDER — ATORVASTATIN CALCIUM 10 MG PO TABS: 5.0000 mg | ORAL_TABLET | Freq: Every day | ORAL | Status: DC

## 2015-07-29 MED ORDER — LEVOTHYROXINE SODIUM 25 MCG PO TABS
ORAL_TABLET | ORAL | Status: DC
Start: 2015-07-29 — End: 2015-09-24

## 2015-07-29 NOTE — Telephone Encounter (Signed)
done

## 2015-08-16 ENCOUNTER — Ambulatory Visit (INDEPENDENT_AMBULATORY_CARE_PROVIDER_SITE_OTHER): Payer: Medicare Other

## 2015-08-16 DIAGNOSIS — Z23 Encounter for immunization: Secondary | ICD-10-CM | POA: Diagnosis not present

## 2015-09-24 ENCOUNTER — Other Ambulatory Visit: Payer: Self-pay

## 2015-09-24 ENCOUNTER — Telehealth: Payer: Self-pay | Admitting: Family Medicine

## 2015-09-24 MED ORDER — LEVOTHYROXINE SODIUM 25 MCG PO TABS: ORAL_TABLET | ORAL | Status: DC

## 2015-09-24 MED ORDER — ATORVASTATIN CALCIUM 10 MG PO TABS: 5.0000 mg | ORAL_TABLET | Freq: Every day | ORAL | Status: DC

## 2015-09-24 NOTE — Telephone Encounter (Signed)
done

## 2015-10-08 ENCOUNTER — Ambulatory Visit (INDEPENDENT_AMBULATORY_CARE_PROVIDER_SITE_OTHER): Payer: Medicare Other | Admitting: Family Medicine

## 2015-10-08 ENCOUNTER — Encounter (INDEPENDENT_AMBULATORY_CARE_PROVIDER_SITE_OTHER): Payer: Self-pay

## 2015-10-08 ENCOUNTER — Encounter: Payer: Self-pay | Admitting: Family Medicine

## 2015-10-08 VITALS — BP 171/81 | HR 64 | Temp 97.2°F | Ht 61.0 in | Wt 122.0 lb

## 2015-10-08 DIAGNOSIS — I700 Atherosclerosis of aorta: Secondary | ICD-10-CM

## 2015-10-08 DIAGNOSIS — D649 Anemia, unspecified: Secondary | ICD-10-CM

## 2015-10-08 DIAGNOSIS — E559 Vitamin D deficiency, unspecified: Secondary | ICD-10-CM | POA: Diagnosis not present

## 2015-10-08 DIAGNOSIS — E039 Hypothyroidism, unspecified: Secondary | ICD-10-CM | POA: Diagnosis not present

## 2015-10-08 DIAGNOSIS — E785 Hyperlipidemia, unspecified: Secondary | ICD-10-CM

## 2015-10-08 DIAGNOSIS — I10 Essential (primary) hypertension: Secondary | ICD-10-CM

## 2015-10-08 NOTE — Patient Instructions (Addendum)
Medicare Annual Wellness Visit  [facility] and the medical providers at [facility] strive to bring you the best medical care.  In doing so we not only want to address your current medical conditions and concerns but also to detect new conditions early and prevent illness, disease and health-related problems.    Medicare offers a yearly Wellness Visit which allows our clinical staff to assess your need for preventative services including immunizations, lifestyle education, counseling to decrease risk of preventable diseases and screening for fall risk and other medical concerns.    This visit is provided free of charge (no copay) for all Medicare recipients. The clinical pharmacists at [facility] have begun to conduct these Wellness Visits which will also include a thorough review of all your medications.    As you primary medical provider recommend that you make an appointment for your Annual Wellness Visit if you have not done so already this year.  You may set up this appointment before you leave today or you may call back [phone] and schedule an appointment.  Please make sure when you call that you mention that you are scheduling your Annual Wellness Visit with the clinical pharmacist so that the appointment may be made for the proper length of time.     Continue current medications. Continue good therapeutic lifestyle changes which include good diet and exercise. Fall precautions discussed with patient. If an FOBT was given today- please return it to our front desk. If you are over 51 years old - you may need Prevnar 29 or the adult Pneumonia vaccine.  **Flu shots are available--- please call and schedule a FLU-CLINIC appointment**  After your visit with [place] today you will receive a survey in the mail or online from [employer] regarding your care with [place]. Please take a moment to fill this out. Your feedback is very  important to [place] as you can help [place] better understand your patient needs as well as improve your experience and satisfaction. WE CARE ABOUT YOU!!!    Take tylenol arthritis and stop Ibuprofen Keep record of Blood Pressures--- bring readings by 2-3 weeks for review and watch sodium intake more closely Follow up with cardiology.

## 2015-10-08 NOTE — Progress Notes (Signed)
Subjective:    Patient ID: Brianna [name], female    DOB: 26-Dec-1924, 81 y.o.   MRN: [id number]  HPI  Pt here for follow up and management of chronic medical problems which includes hypothyroid and hypertension. She is taking medications regularly. The patient is doing well today and only complains of some arthralgias. She is due to get lab work and due to return a fecal occult blood test card. It is significant to note that she has a history of neoplasm of the colon. Was cancerous. The patient lives not far from her daughter and her daughter keeps a close check on her. She looks much younger and asked much younger than her stated age of 69 years. She denies any chest pain shortness of breath trouble swallowing heartburn indigestion nausea vomiting diarrhea or blood in the stool. Her biggest complaint is her arthralgias. She does take ibuprofen or Advil for this. Her blood pressure today was elevated and she understands that ibuprofen can cause this to happen. She does have some Tylenol arthritis and she will change over to this at home and take that instead. She also has a history of carpal tunnel syndrome and has a lot of weakness and numbness and tingling in her hands.     Patient Active Problem List   Diagnosis Date Noted  . Enlarged heart 04/08/2015  . Thoracic aortic atherosclerosis ([place]) 04/08/2015  . Osteoporosis, post-menopausal 11/25/2014  . Bilateral hand pain 04/13/2014  . Vitamin D deficiency 08/18/2013  . Colon cancer ([place]) 08/18/2013  . Mitral valve disease 05/27/2013  . Hyperlipidemia 03/25/2013  . Expected blood loss anemia 03/05/2013  . S/P right THA, AA 03/04/2013  . Essential hypertension, benign 02/16/2013  . COPD (chronic obstructive pulmonary disease) ([place]) 02/16/2013  . Arthritis 10/12/2011   Outpatient Encounter Prescriptions as of 10/08/2015  Medication Sig  . acetaminophen (TYLENOL) 500 MG tablet Take 500 mg by mouth every 6 (six) hours as needed. Pain    .  aspirin 81 MG tablet Take 81 mg by mouth 3 (three) times a week.  Marland Kitchen atorvastatin (LIPITOR) 10 MG tablet Take 0.5 tablets (5 mg total) by mouth at bedtime.  . beta carotene w/minerals (OCUVITE) tablet Take 1 tablet by mouth daily.    . Calcium Citrate-Vitamin D (CITRACAL + D PO) Take 2 tablets by mouth daily.   . Cholecalciferol (VITAMIN D3) 2000 UNITS TABS Take 1 tablet by mouth daily.   . hydrochlorothiazide (MICROZIDE) 12.5 MG capsule Take 1 capsule (12.5 mg total) by mouth daily.  Marland Kitchen levothyroxine (SYNTHROID, LEVOTHROID) 25 MCG tablet Take 50 MG Monday through Friday and take 25 MG on Saturday and Sunday  . losartan (COZAAR) 100 MG tablet TAKE ONE TABLET BY MOUTH ONE TIME DAILY  . Omega-3 Fatty Acids (FISH OIL PO) Take 1 tablet by mouth daily.   No facility-administered encounter medications on file as of 10/08/2015.     Review of Systems  Constitutional: Negative.   HENT: Negative.   Eyes: Negative.   Respiratory: Negative.   Cardiovascular: Negative.   Gastrointestinal: Negative.   Endocrine: Negative.   Genitourinary: Negative.   Musculoskeletal: Positive for arthralgias.  Skin: Negative.   Allergic/Immunologic: Negative.   Neurological: Negative.   Hematological: Negative.   Psychiatric/Behavioral: Negative.        Objective:   Physical Exam  Constitutional: She is oriented to person, place, and time. She appears well-nourished.  The patient's small framed elderly looks much younger than her stated age of 73 years.  She is alert and cooperative.  HENT:  Head: Normocephalic and atraumatic.  Right Ear: External ear normal.  Left Ear: External ear normal.  Nose: Nose normal.  Mouth/Throat: Oropharynx is clear and moist.  Eyes: Conjunctivae and EOM are normal. Pupils are equal, round, and reactive to light. Right eye exhibits no discharge. Left eye exhibits no discharge. No scleral icterus.  Neck: Normal range of motion. Neck supple. No thyromegaly present.  Without bruits  or thyromegaly  Cardiovascular: Normal rate, normal heart sounds and intact distal pulses.   No murmur heard. Heart is irregular irregular at 72/m she has an upcoming appointment with the cardiologist. She has to schedule this.  Pulmonary/Chest: Effort normal and breath sounds normal. No respiratory distress. She has no wheezes. She has no rales. She exhibits no tenderness.  Abdominal: Soft. Bowel sounds are normal. She exhibits no mass. There is tenderness. There is no rebound and no guarding.  There is slight epigastric and abdominal tenderness in general without masses or organ enlargement  Musculoskeletal: Normal range of motion. She exhibits no edema.  The patient has muscle atrophy in both hands and there is a lot of arthritic changes in both hands and fingers.  Lymphadenopathy:    She has no cervical adenopathy.  Neurological: She is alert and oriented to person, place, and time.  Skin: Skin is warm and dry. No rash noted.  Psychiatric: She has a normal mood and affect. Her behavior is normal. Judgment and thought content normal.  Nursing note and vitals reviewed.   BP 171/81 mmHg  Pulse 64  Temp(Src) 97.2 F (36.2 C) (Oral)  Ht '5\' 1"'  (1.549 m)  Wt 122 lb (55.339 kg)  BMI 23.06 kg/m2       Assessment & Plan:  1. Vitamin D deficiency -Continue current treatment pending results of lab work -Continue to be careful not put yourself at risk for falling - CBC with Differential/Platelet - VITAMIN D 25 Hydroxy (Vit-D Deficiency, Fractures)  2. Hyperlipidemia -Continue current treatment pending results of lab work - Lipid panel - CBC with Differential/Platelet  3. Essential hypertension, benign -Check blood pressures at home and bring these readings by for review in 2-3 weeks -Decrease sodium intake -Discontinue Advil - BMP8+EGFR - CBC with Differential/Platelet - Hepatic function panel  4. Anemia, unspecified anemia type -Continue with a well-balanced diet and we will  call you with the results of your hemoglobin since this becomes available - CBC with Differential/Platelet  5. Thoracic aortic atherosclerosis ([place]) -Continue with aggressive therapeutic lifestyle changes and current cholesterol medication - CBC with Differential/Platelet  6. Hypothyroidism, unspecified hypothyroidism type -Continue with current thyroid replacement pending results of lab work - CBC with Differential/Platelet - Thyroid Panel With TSH  Patient Instructions                       Medicare Annual Wellness Visit  [facility] and the medical providers at [facility] strive to bring you the best medical care.  In doing so we not only want to address your current medical conditions and concerns but also to detect new conditions early and prevent illness, disease and health-related problems.    Medicare offers a yearly Wellness Visit which allows our clinical staff to assess your need for preventative services including immunizations, lifestyle education, counseling to decrease risk of preventable diseases and screening for fall risk and other medical concerns.    This visit is provided free of charge (no copay) for  all Medicare recipients. The clinical pharmacists at [facility] have begun to conduct these Wellness Visits which will also include a thorough review of all your medications.    As you primary medical provider recommend that you make an appointment for your Annual Wellness Visit if you have not done so already this year.  You may set up this appointment before you leave today or you may call back ([phone]) and schedule an appointment.  Please make sure when you call that you mention that you are scheduling your Annual Wellness Visit with the clinical pharmacist so that the appointment may be made for the proper length of time.     Continue current medications. Continue good therapeutic lifestyle changes which include good  diet and exercise. Fall precautions discussed with patient. If an FOBT was given today- please return it to our front desk. If you are over 66 years old - you may need Prevnar 58 or the adult Pneumonia vaccine.  **Flu shots are available--- please call and schedule a FLU-CLINIC appointment**  After your visit with [place] today you will receive a survey in the mail or online from [employer] regarding your care with [place]. Please take a moment to fill this out. Your feedback is very important to [place] as you can help [place] better understand your patient needs as well as improve your experience and satisfaction. WE CARE ABOUT YOU!!!    Take tylenol arthritis and stop Ibuprofen Keep record of Blood Pressures--- bring readings by 2-3 weeks for review and watch sodium intake more closely Follow up with cardiology.     Arrie Senate MD

## 2015-10-09 LAB — CBC WITH DIFFERENTIAL/PLATELET
BASOS ABS: 0 10*3/uL (ref 0.0–0.2)
Basos: 0 %
EOS (ABSOLUTE): 0 10*3/uL (ref 0.0–0.4)
EOS: 1 %
HEMATOCRIT: 38.8 % (ref 34.0–46.6)
HEMOGLOBIN: 13.2 g/dL (ref 11.1–15.9)
Immature Grans (Abs): 0 10*3/uL (ref 0.0–0.1)
Immature Granulocytes: 0 %
LYMPHS ABS: 1.3 10*3/uL (ref 0.7–3.1)
Lymphs: 25 %
MCH: 31.7 pg (ref 26.6–33.0)
MCHC: 34 g/dL (ref 31.5–35.7)
MCV: 93 fL (ref 79–97)
MONOCYTES: 13 %
MONOS ABS: 0.7 10*3/uL (ref 0.1–0.9)
NEUTROS ABS: 3.1 10*3/uL (ref 1.4–7.0)
Neutrophils: 61 %
Platelets: 217 10*3/uL (ref 150–379)
RBC: 4.17 x10E6/uL (ref 3.77–5.28)
RDW: 13.4 % (ref 12.3–15.4)
WBC: 5.2 10*3/uL (ref 3.4–10.8)

## 2015-10-09 LAB — BMP8+EGFR
BUN/Creatinine Ratio: 22 (ref 11–26)
BUN: 22 mg/dL (ref 8–27)
CO2: 25 mmol/L (ref 18–29)
Calcium: 10.1 mg/dL (ref 8.7–10.3)
Chloride: 98 mmol/L (ref 97–106)
Creatinine, Ser: 0.99 mg/dL (ref 0.57–1.00)
GFR calc Af Amer: 59 mL/min/1.73 — ABNORMAL LOW
GFR calc non Af Amer: 51 mL/min/1.73 — ABNORMAL LOW
Glucose: 94 mg/dL (ref 65–99)
Potassium: 4.4 mmol/L (ref 3.5–5.2)
Sodium: 139 mmol/L (ref 136–144)

## 2015-10-09 LAB — HEPATIC FUNCTION PANEL
ALBUMIN: 4.5 g/dL (ref 3.5–4.7)
ALK PHOS: 58 IU/L (ref 39–117)
ALT: 16 IU/L (ref 0–32)
AST: 24 IU/L (ref 0–40)
BILIRUBIN, DIRECT: 0.16 mg/dL (ref 0.00–0.40)
Bilirubin Total: 0.4 mg/dL (ref 0.0–1.2)
TOTAL PROTEIN: 7.1 g/dL (ref 6.0–8.5)

## 2015-10-09 LAB — VITAMIN D 25 HYDROXY (VIT D DEFICIENCY, FRACTURES): Vit D, 25-Hydroxy: 60.1 ng/mL (ref 30.0–100.0)

## 2015-10-09 LAB — LIPID PANEL
CHOL/HDL RATIO: 2 ratio (ref 0.0–4.4)
Cholesterol, Total: 191 mg/dL (ref 100–199)
HDL: 95 mg/dL (ref >39)
LDL Calculated: 85 mg/dL (ref 0–99)
TRIGLYCERIDES: 55 mg/dL (ref 0–149)
VLDL Cholesterol Cal: 11 mg/dL (ref 5–40)

## 2015-10-09 LAB — THYROID PANEL WITH TSH
Free Thyroxine Index: 2.6 (ref 1.2–4.9)
T3 Uptake Ratio: 30 % (ref 24–39)
T4 TOTAL: 8.5 ug/dL (ref 4.5–12.0)
TSH: 2.95 u[IU]/mL (ref 0.450–4.500)

## 2015-10-11 ENCOUNTER — Other Ambulatory Visit: Payer: Medicare Other

## 2015-10-11 DIAGNOSIS — Z1212 Encounter for screening for malignant neoplasm of rectum: Secondary | ICD-10-CM

## 2015-10-13 LAB — FECAL OCCULT BLOOD, IMMUNOCHEMICAL: Fecal Occult Bld: NEGATIVE

## 2015-11-20 ENCOUNTER — Other Ambulatory Visit: Payer: Self-pay | Admitting: Family Medicine

## 2015-12-10 ENCOUNTER — Encounter: Payer: Self-pay | Admitting: Cardiology

## 2015-12-10 ENCOUNTER — Ambulatory Visit (INDEPENDENT_AMBULATORY_CARE_PROVIDER_SITE_OTHER): Payer: Medicare Other | Admitting: Cardiology

## 2015-12-10 VITALS — BP 128/80 | HR 89 | Ht 61.0 in | Wt 124.0 lb

## 2015-12-10 DIAGNOSIS — E782 Mixed hyperlipidemia: Secondary | ICD-10-CM

## 2015-12-10 DIAGNOSIS — I059 Rheumatic mitral valve disease, unspecified: Secondary | ICD-10-CM

## 2015-12-10 DIAGNOSIS — I10 Essential (primary) hypertension: Secondary | ICD-10-CM

## 2015-12-10 NOTE — Patient Instructions (Signed)
Continue all current medications. Your physician wants you to follow up in: 6 months.  You will receive a reminder letter in the mail one-two months in advance.  If you don't receive a letter, please call our office to schedule the follow up appointment   

## 2015-12-10 NOTE — Progress Notes (Signed)
Cardiology Office Note  Date: 12/10/2015   ID: KIMBALL VANDER, DOB 08-03-1925, MRN [id number]  PCP: Redge Gainer, MD  Primary Cardiologist: Rozann Lesches, MD   Chief Complaint  Patient presents with  . Mitral valve disease    History of Present Illness: Brianna [name] is an 82 y.o. female last seen in August 2016. She presents for a routine visit today. Continues to do very well, remains functional with ADLs around the house including yard work. She does not endorse any progressing shortness of breath, has had no chest pain, palpitations, or syncope. At baseline she has NYHA class II dyspnea.  We have continued conservative management of mitral valve disease including previously documented moderate to severe mitral regurgitation in 2014. She has not been inclined to pursue aggressive workup or follow-up imaging studies.  Cardiac murmur has been stable over time.  She continues to follow with Dr. Laurance Flatten. I reviewed her lab work from December 2016. Lipids have been in good order. Blood pressure is well controlled today.  Past Medical History  Diagnosis Date  . Mixed hyperlipidemia   . Lumbosacral spondylosis without myelopathy   . Fibrocystic breast   . Osteoporosis   . Symptomatic menopausal or female climacteric states   . Colon polyp   . Essential hypertension, benign   . 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 PONV (postoperative nausea and vomiting)   . COPD (chronic obstructive pulmonary disease) ([place])   . Hypothyroidism   . Ventral hernia   . Bilateral inguinal hernia (BIH)   . Obturator hernia, right   . Myxomatous mitral valve     Mild prolapse with eccentric, moderate to severe regurgitation  . Carpal tunnel syndrome on right   . Frequency of urination   . History of skin cancer     Current Outpatient Prescriptions  Medication Sig Dispense Refill  . acetaminophen (TYLENOL) 500 MG tablet Take 500 mg by mouth every 6 (six) hours as needed. Pain      .  aspirin 81 MG tablet Take 81 mg by mouth 3 (three) times a week.    Marland Kitchen atorvastatin (LIPITOR) 10 MG tablet Take 0.5 tablets (5 mg total) by mouth at bedtime. 90 tablet 0  . beta carotene w/minerals (OCUVITE) tablet Take 1 tablet by mouth daily.      . Calcium Citrate-Vitamin D (CITRACAL + D PO) Take 2 tablets by mouth daily.     . Cholecalciferol (VITAMIN D3) 2000 UNITS TABS Take 1 tablet by mouth daily.     . hydrochlorothiazide (MICROZIDE) 12.5 MG capsule Take 1 capsule (12.5 mg total) by mouth daily. 90 capsule 3  . levothyroxine (SYNTHROID, LEVOTHROID) 25 MCG tablet Take 50 MG Monday through Friday and take 25 MG on Saturday and Sunday 180 tablet 0  . losartan (COZAAR) 100 MG tablet TAKE ONE TABLET BY MOUTH ONE TIME DAILY 90 tablet 1  . Omega-3 Fatty Acids (FISH OIL PO) Take 1 tablet by mouth daily.     No current facility-administered medications for this visit.   Allergies:  Alendronate sodium; Lodine; and Risedronate sodium   Social History: The patient  reports that she has never smoked. She has never used smokeless tobacco. She reports that she does not drink alcohol or use illicit drugs.   ROS:  Please see the history of present illness. Otherwise, complete review of systems is positive for chronic arthritis pain, worse with the cold weather.  All other systems are reviewed  and negative.   Physical Exam: VS:  BP 128/80 mmHg  Pulse 89  Ht 5\' 1"  (1.549 m)  Wt 124 lb (56.246 kg)  BMI 23.44 kg/m2  SpO2 98%, BMI Body mass index is 23.44 kg/(m^2).  Wt Readings from Last 3 Encounters:  12/10/15 124 lb (56.246 kg)  10/08/15 122 lb (55.339 kg)  06/14/15 121 lb (54.885 kg)    Patient in no acute distress.  HEENT: Conjunctiva and lids normal, oropharynx clear.  Neck: Supple, no elevated JVP or carotid bruits, no thyromegaly.  Lungs: Clear to auscultation, nonlabored breathing at rest.  Cardiac: Regular rate and rhythm, no S3, 2/6 systolic murmur in mid systole mainly in the  lateral thorax and into the back, no pericardial rub.  Abdomen: Soft, nontender, bowel sounds present.  Extremities: No pitting edema, distal pulses 2+.  ECG:  I personally reviewed the prior tracing from 06/14/2015 which showed sinus rhythm second-degree type I heart block and PVC, left atrial enlargement, nonspecific T-wave changes.  Recent Labwork:  04/08/2015: Hemoglobin 12.7 10/08/2015: ALT 16; AST 24; BUN 22; Creatinine, Ser 0.99; Platelets 217; Potassium 4.4; Sodium 139; TSH 2.950     Component Value Date/Time   CHOL 191 10/08/2015 1126   CHOL 186 08/24/2014 1230   TRIG 55 10/08/2015 1126   TRIG 62 08/24/2014 1230   HDL 95 10/08/2015 1126   HDL 72 08/24/2014 1230   HDL 63 03/25/2013 1255   CHOLHDL 2.0 10/08/2015 1126   CHOLHDL 2.7 03/25/2013 1255   VLDL 12 03/25/2013 1255   LDLCALC 85 10/08/2015 1126   LDLCALC 79 08/21/2013 0838   LDLCALC 92 03/25/2013 1255    Other Studies Reviewed Today:  Echocardiogram April 2014: Mild LVH with LVEF [id number], grade 1 diastolic dysfunction, mildly calcified aortic valve with mild aortic regurgitation, myxomatous mitral valve with mild prolapse and moderate to severe eccentric mitral regurgitation, moderate left atrial enlargement, mild right atrial enlargement, mild tricuspid regurgitation with PASP 32 mmHg.  Assessment and Plan:  1. Myxomatous mitral valve with prolapse and previously documented moderate to severe mitral regurgitation. Ms. Bourlier continues to do very well symptomatically and prefers a conservative approach overall. We have not reimaged her. Murmur is stable on examination.  2. Hyperlipidemia, lipids have been well controlled overall, recent LDL 85 in December 2016.  3. Essential hypertension, blood pressure is well controlled today.  Current medicines were reviewed with the patient today.  Disposition: FU with me in 6 months.   Signed, Satira Sark, MD, [facility] 12/10/2015 11:01 AM    [facility] [place], [place], [place] [address] Phone: [phone]; Fax: [phone]

## 2016-01-14 DIAGNOSIS — H353131 Nonexudative age-related macular degeneration, bilateral, early dry stage: Secondary | ICD-10-CM | POA: Diagnosis not present

## 2016-04-05 ENCOUNTER — Ambulatory Visit (INDEPENDENT_AMBULATORY_CARE_PROVIDER_SITE_OTHER): Payer: Medicare Other | Admitting: Family Medicine

## 2016-04-05 ENCOUNTER — Encounter: Payer: Self-pay | Admitting: Family Medicine

## 2016-04-05 VITALS — BP 155/85 | HR 83 | Temp 97.4°F | Ht 61.0 in | Wt 121.0 lb

## 2016-04-05 DIAGNOSIS — D649 Anemia, unspecified: Secondary | ICD-10-CM

## 2016-04-05 DIAGNOSIS — E559 Vitamin D deficiency, unspecified: Secondary | ICD-10-CM

## 2016-04-05 DIAGNOSIS — E039 Hypothyroidism, unspecified: Secondary | ICD-10-CM

## 2016-04-05 DIAGNOSIS — E785 Hyperlipidemia, unspecified: Secondary | ICD-10-CM | POA: Diagnosis not present

## 2016-04-05 DIAGNOSIS — I10 Essential (primary) hypertension: Secondary | ICD-10-CM

## 2016-04-05 DIAGNOSIS — J410 Simple chronic bronchitis: Secondary | ICD-10-CM | POA: Diagnosis not present

## 2016-04-05 DIAGNOSIS — I700 Atherosclerosis of aorta: Secondary | ICD-10-CM | POA: Diagnosis not present

## 2016-04-05 DIAGNOSIS — I517 Cardiomegaly: Secondary | ICD-10-CM

## 2016-04-05 DIAGNOSIS — R252 Cramp and spasm: Secondary | ICD-10-CM

## 2016-04-05 DIAGNOSIS — I059 Rheumatic mitral valve disease, unspecified: Secondary | ICD-10-CM

## 2016-04-05 NOTE — Patient Instructions (Addendum)
Medicare Annual Wellness Visit  [facility] and the medical providers at [facility] strive to bring you the best medical care.  In doing so we not only want to address your current medical conditions and concerns but also to detect new conditions early and prevent illness, disease and health-related problems.    Medicare offers a yearly Wellness Visit which allows our clinical staff to assess your need for preventative services including immunizations, lifestyle education, counseling to decrease risk of preventable diseases and screening for fall risk and other medical concerns.    This visit is provided free of charge (no copay) for all Medicare recipients. The clinical pharmacists at [facility] have begun to conduct these Wellness Visits which will also include a thorough review of all your medications.    As you primary medical provider recommend that you make an appointment for your Annual Wellness Visit if you have not done so already this year.  You may set up this appointment before you leave today or you may call back [phone] and schedule an appointment.  Please make sure when you call that you mention that you are scheduling your Annual Wellness Visit with the clinical pharmacist so that the appointment may be made for the proper length of time.    Continue current medications. Continue good therapeutic lifestyle changes which include good diet and exercise. Fall precautions discussed with patient. If an FOBT was given today- please return it to our front desk. If you are over 95 years old - you may need Prevnar 70 or the adult Pneumonia vaccine.  **Flu shots are available--- please call and schedule a FLU-CLINIC appointment**  After your visit with [place] today you will receive a survey in the mail or online from [employer] regarding your care with [place]. Please take a moment to fill this out. Your feedback is very  important to [place] as you can help [place] better understand your patient needs as well as improve your experience and satisfaction. WE CARE ABOUT YOU!!!   Continue to stay active physically Continue to follow-up with the cardiologist because of mitral valve stenosis and prolapse Do not climb Drink more water and fluids above and beyond what you are already doing and especially if you're hot and working outside Continue to take the prophylactic amoxicillin prior to dental procedures and discuss the need for this at your next visit with Dr. Domenic Polite

## 2016-04-05 NOTE — Progress Notes (Signed)
Subjective:    Patient ID: Brianna [name], female    DOB: 1925/09/02, 82 y.o.   MRN: [id number]  HPI Pt here for follow up and management of chronic medical problems which includes hypertension and hyperlipidemia. She is taking medications regularly.The patient is complaining of some muscle cramps and arthralgias. She also has questions about taking prophylactic antibiotics prior to dental procedures and we recommended that she continue to do that and she couldn't clarify that more after she sees a cardiologist the next time. She is due to get lab work today. She has a history of mitral valve stenosis and regurgitation is followed regularly by the cardiologist every 6 months. She also has a history of colon cancer with colon resection. She has severe arthritis in her neck with foraminal stenosis affecting the nurse prominently on her left side. The patient denies chest pain shortness of breath trouble swallowing heartburn indigestion nausea vomiting diarrhea or blood in the stool. She has a history of colon cancer and she keeps her stools checked regularly. She goes to the bathroom frequently to pass her water but has not seen any blood in the urine. Her arthralgias are in her hands and all over. 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 She also has a lot of arthritis in her neck as mentioned above. She has muscle cramps and indicates that she drinks water but maybe not enough water. She is active physically and gets out in the yard and does yard work etc. She is careful not to climb and not to put herself at risk for falling. She has a daughter that lives close by and a son that lives in [place]. The daughter keeps close check on her. She is very alert and young appearing for her age of 32 years.     Patient Active Problem List   Diagnosis Date Noted  . Enlarged heart 04/08/2015  . Thoracic aortic atherosclerosis ([place]) 04/08/2015  . Osteoporosis, post-menopausal  11/25/2014  . Bilateral hand pain 04/13/2014  . Vitamin D deficiency 08/18/2013  . Colon cancer ([place]) 08/18/2013  . Mitral valve disease 05/27/2013  . Hyperlipidemia 03/25/2013  . Expected blood loss anemia 03/05/2013  . S/P right THA, AA 03/04/2013  . Essential hypertension, benign 02/16/2013  . COPD (chronic obstructive pulmonary disease) ([place]) 02/16/2013  . Arthritis 10/12/2011   Outpatient Encounter Prescriptions as of 04/05/2016  Medication Sig  . acetaminophen (TYLENOL) 500 MG tablet Take 500 mg by mouth every 6 (six) hours as needed. Pain    . aspirin 81 MG tablet Take 81 mg by mouth 3 (three) times a week.  Marland Kitchen atorvastatin (LIPITOR) 10 MG tablet Take 0.5 tablets (5 mg total) by mouth at bedtime.  . beta carotene w/minerals (OCUVITE) tablet Take 1 tablet by mouth daily.    . Calcium Citrate-Vitamin D (CITRACAL + D PO) Take 2 tablets by mouth daily.   . Cholecalciferol (VITAMIN D3) 2000 UNITS TABS Take 1 tablet by mouth daily.   . hydrochlorothiazide (MICROZIDE) 12.5 MG capsule Take 1 capsule (12.5 mg total) by mouth daily.  Marland Kitchen levothyroxine (SYNTHROID, LEVOTHROID) 25 MCG tablet Take 50 MG Monday through Friday and take 25 MG on Saturday and Sunday  . losartan (COZAAR) 100 MG tablet TAKE ONE TABLET BY MOUTH ONE TIME DAILY  . Omega-3 Fatty Acids (FISH OIL PO) Take 1 tablet by mouth daily.   No facility-administered encounter medications on file as of 04/05/2016.      Review  of Systems  Constitutional: Negative.   HENT: Negative.   Eyes: Negative.   Respiratory: Negative.   Cardiovascular: Negative.   Gastrointestinal: Negative.   Endocrine: Negative.   Genitourinary: Negative.   Musculoskeletal: Positive for arthralgias.       Muscle cramps  Skin: Negative.   Allergic/Immunologic: Negative.   Neurological: Negative.   Hematological: Negative.   Psychiatric/Behavioral: Negative.        Objective:   Physical Exam  Constitutional: She is oriented to person, place, and  time. She appears well-developed and well-nourished. No distress.  Patient is pleasant and alert and looks much younger than her stated age of 87 years  HENT:  Head: Normocephalic and atraumatic.  Right Ear: External ear normal.  Left Ear: External ear normal.  Nose: Nose normal.  Mouth/Throat: Oropharynx is clear and moist.  Eyes: Conjunctivae and EOM are normal. Pupils are equal, round, and reactive to light. Right eye exhibits no discharge. Left eye exhibits no discharge. No scleral icterus.  Neck: Normal range of motion. Neck supple. No thyromegaly present.  No bruits were audible. There is no thyromegaly or anterior cervical adenopathy  Cardiovascular: Normal rate and intact distal pulses.  Exam reveals no friction rub.   Murmur heard. The murmur was less audible to me today that apparently to the cardiologist. The heart was irregular at 72/m  Pulmonary/Chest: Effort normal and breath sounds normal. No respiratory distress. She has no wheezes. She has no rales. She exhibits no tenderness.  Abdominal: Soft. Bowel sounds are normal. She exhibits no mass. There is no tenderness. There is no rebound and no guarding.  Musculoskeletal: Normal range of motion. She exhibits no edema or tenderness.  A lot of arthritic changes especially noted in the patient's hands.  Lymphadenopathy:    She has no cervical adenopathy.  Neurological: She is alert and oriented to person, place, and time. She has normal reflexes. No cranial nerve deficit.  Skin: Skin is warm and dry. No rash noted.  Psychiatric: She has a normal mood and affect. Her behavior is normal. Judgment and thought content normal.  Positive affect and mood and she was alert.  Nursing note and vitals reviewed.  BP 155/85 mmHg  Pulse 83  Temp(Src) 97.4 F (36.3 C) (Oral)  Ht _0  (1.549 m)  Wt 121 lb (54.885 kg)  BMI 22.87 kg/m2        Assessment & Plan:  1. Vitamin D deficiency -Continue current treatment pending results of  lab work - CBC with Differential/Platelet - VITAMIN D 25 Hydroxy (Vit-D Deficiency, Fractures)  2. Hyperlipidemia -Continue atorvastatin and omega-3 fatty acids along with aggressive therapeutic lifestyle changes pending results of lab work - CBC with Differential/Platelet - Lipid panel  3. Essential hypertension, benign -The blood pressure was elevated today we will ask the patient to come back by in a couple weeks to have the nurse to recheck the blood pressure and bring home readings if available - BMP8+EGFR - CBC with Differential/Platelet - Hepatic function panel  4. Thoracic aortic atherosclerosis ([place]) -[place] aggressive therapeutic lifestyle changes with diet and exercise - CBC with Differential/Platelet  5. Anemia, unspecified anemia type - CBC with Differential/Platelet  6. Hypothyroidism, unspecified hypothyroidism type -Continue current treatment pending results of lab work - CBC with Differential/Platelet - Thyroid Panel With TSH  7. Muscle cramps -We will check electrolytes and encourage the patient to drink more water and fluids - Thyroid Panel With TSH - Vitamin B12  8. Enlarged heart -Continue follow-up  with cardiology  9. Mitral valve disease -Continue follow-up with cardiology  10. Simple chronic bronchitis ([place]) -Stay well hydrated plenty of fluids and avoid irritating environments  Patient Instructions                       Medicare Annual Wellness Visit  [facility] and the medical providers at [facility] strive to bring you the best medical care.  In doing so we not only want to address your current medical conditions and concerns but also to detect new conditions early and prevent illness, disease and health-related problems.    Medicare offers a yearly Wellness Visit which allows our clinical staff to assess your need for preventative services including immunizations, lifestyle education, counseling to decrease risk of  preventable diseases and screening for fall risk and other medical concerns.    This visit is provided free of charge (no copay) for all Medicare recipients. The clinical pharmacists at [facility] have begun to conduct these Wellness Visits which will also include a thorough review of all your medications.    As you primary medical provider recommend that you make an appointment for your Annual Wellness Visit if you have not done so already this year.  You may set up this appointment before you leave today or you may call back ([phone]) and schedule an appointment.  Please make sure when you call that you mention that you are scheduling your Annual Wellness Visit with the clinical pharmacist so that the appointment may be made for the proper length of time.    Continue current medications. Continue good therapeutic lifestyle changes which include good diet and exercise. Fall precautions discussed with patient. If an FOBT was given today- please return it to our front desk. If you are over 60 years old - you may need Prevnar 59 or the adult Pneumonia vaccine.  **Flu shots are available--- please call and schedule a FLU-CLINIC appointment**  After your visit with [place] today you will receive a survey in the mail or online from [employer] regarding your care with [place]. Please take a moment to fill this out. Your feedback is very important to [place] as you can help [place] better understand your patient needs as well as improve your experience and satisfaction. WE CARE ABOUT YOU!!!   Continue to stay active physically Continue to follow-up with the cardiologist because of mitral valve stenosis and prolapse Do not climb Drink more water and fluids above and beyond what you are already doing and especially if you're hot and working outside Continue to take the prophylactic amoxicillin prior to dental procedures and discuss the need for this at your next visit with Dr. Domenic Polite    Arrie Senate MD

## 2016-04-06 LAB — BMP8+EGFR
BUN / CREAT RATIO: 24 (ref 12–28)
BUN: 26 mg/dL (ref 8–27)
CHLORIDE: 97 mmol/L (ref 96–106)
CO2: 24 mmol/L (ref 18–29)
Calcium: 10.2 mg/dL (ref 8.7–10.3)
Creatinine, Ser: 1.07 mg/dL — ABNORMAL HIGH (ref 0.57–1.00)
GFR calc Af Amer: 53 mL/min/{1.73_m2} — ABNORMAL LOW (ref >59)
GFR calc non Af Amer: 46 mL/min/{1.73_m2} — ABNORMAL LOW (ref >59)
GLUCOSE: 85 mg/dL (ref 65–99)
Potassium: 3.9 mmol/L (ref 3.5–5.2)
SODIUM: 139 mmol/L (ref 134–144)

## 2016-04-06 LAB — HEPATIC FUNCTION PANEL
ALBUMIN: 4.4 g/dL (ref 3.5–4.7)
ALT: 14 IU/L (ref 0–32)
AST: 22 IU/L (ref 0–40)
Alkaline Phosphatase: 61 IU/L (ref 39–117)
Bilirubin Total: 0.4 mg/dL (ref 0.0–1.2)
Bilirubin, Direct: 0.13 mg/dL (ref 0.00–0.40)
TOTAL PROTEIN: 7 g/dL (ref 6.0–8.5)

## 2016-04-06 LAB — LIPID PANEL
CHOL/HDL RATIO: 2.3 ratio (ref 0.0–4.4)
Cholesterol, Total: 194 mg/dL (ref 100–199)
HDL: 86 mg/dL (ref >39)
LDL Calculated: 97 mg/dL (ref 0–99)
TRIGLYCERIDES: 55 mg/dL (ref 0–149)
VLDL Cholesterol Cal: 11 mg/dL (ref 5–40)

## 2016-04-06 LAB — VITAMIN D 25 HYDROXY (VIT D DEFICIENCY, FRACTURES): VIT D 25 HYDROXY: 66.1 ng/mL (ref 30.0–100.0)

## 2016-04-06 LAB — CBC WITH DIFFERENTIAL/PLATELET
BASOS ABS: 0 10*3/uL (ref 0.0–0.2)
Basos: 0 %
EOS (ABSOLUTE): 0 10*3/uL (ref 0.0–0.4)
Eos: 1 %
Hematocrit: 36.6 % (ref 34.0–46.6)
Hemoglobin: 12.5 g/dL (ref 11.1–15.9)
Immature Grans (Abs): 0 10*3/uL (ref 0.0–0.1)
Immature Granulocytes: 0 %
LYMPHS ABS: 1.5 10*3/uL (ref 0.7–3.1)
Lymphs: 32 %
MCH: 31.5 pg (ref 26.6–33.0)
MCHC: 34.2 g/dL (ref 31.5–35.7)
MCV: 92 fL (ref 79–97)
MONOS ABS: 0.6 10*3/uL (ref 0.1–0.9)
Monocytes: 14 %
Neutrophils Absolute: 2.4 10*3/uL (ref 1.4–7.0)
Neutrophils: 53 %
Platelets: 199 10*3/uL (ref 150–379)
RBC: 3.97 x10E6/uL (ref 3.77–5.28)
RDW: 14 % (ref 12.3–15.4)
WBC: 4.6 10*3/uL (ref 3.4–10.8)

## 2016-04-06 LAB — THYROID PANEL WITH TSH
Free Thyroxine Index: 2.4 (ref 1.2–4.9)
T3 Uptake Ratio: 30 % (ref 24–39)
T4 TOTAL: 7.9 ug/dL (ref 4.5–12.0)
TSH: 3.04 u[IU]/mL (ref 0.450–4.500)

## 2016-04-06 LAB — VITAMIN B12: Vitamin B-12: 453 pg/mL (ref 211–946)

## 2016-04-11 ENCOUNTER — Other Ambulatory Visit: Payer: Self-pay | Admitting: Family Medicine

## 2016-04-17 IMAGING — CR DG SHOULDER 2+V*L*
3 series · 3 of 3 positions shown · non-contrast
Comparison: . Chest x-ray dated February 25, 2013 which included the
left shoulder.

CLINICAL DATA: Left shoulder pain with pain and numbness in the
left upper extremity

EXAM:
LEFT SHOULDER - 2+ VIEW

[view not recorded (1 of 3)]
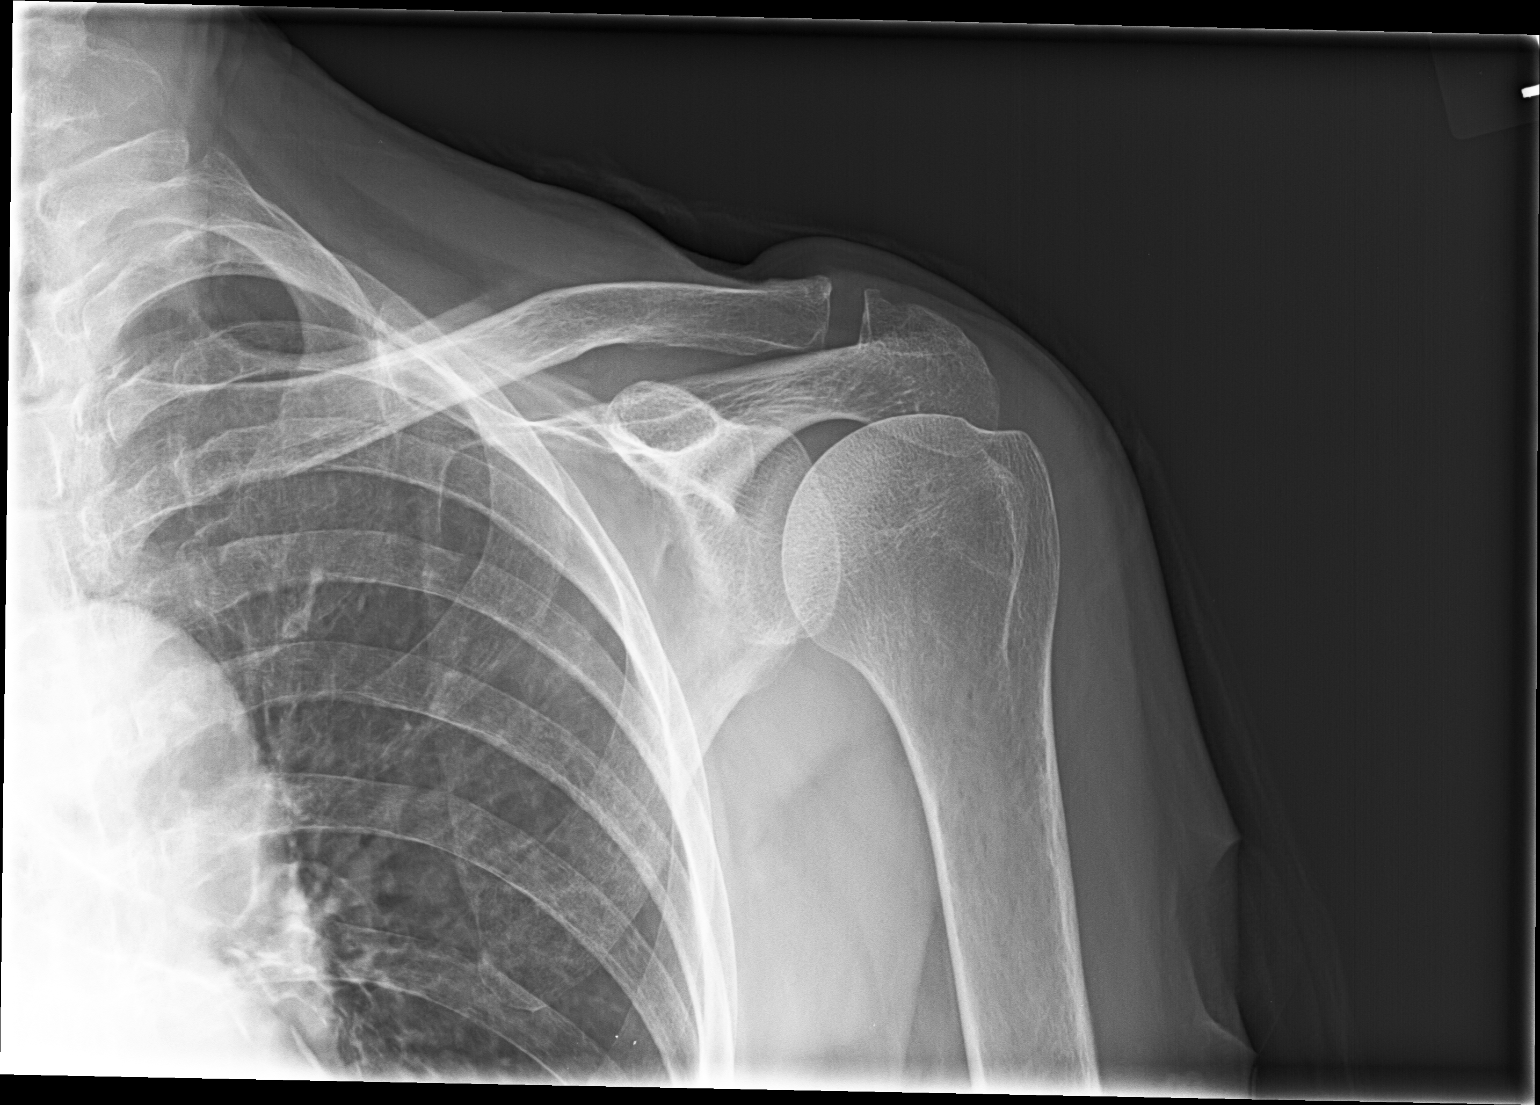

[view not recorded (2 of 3)]
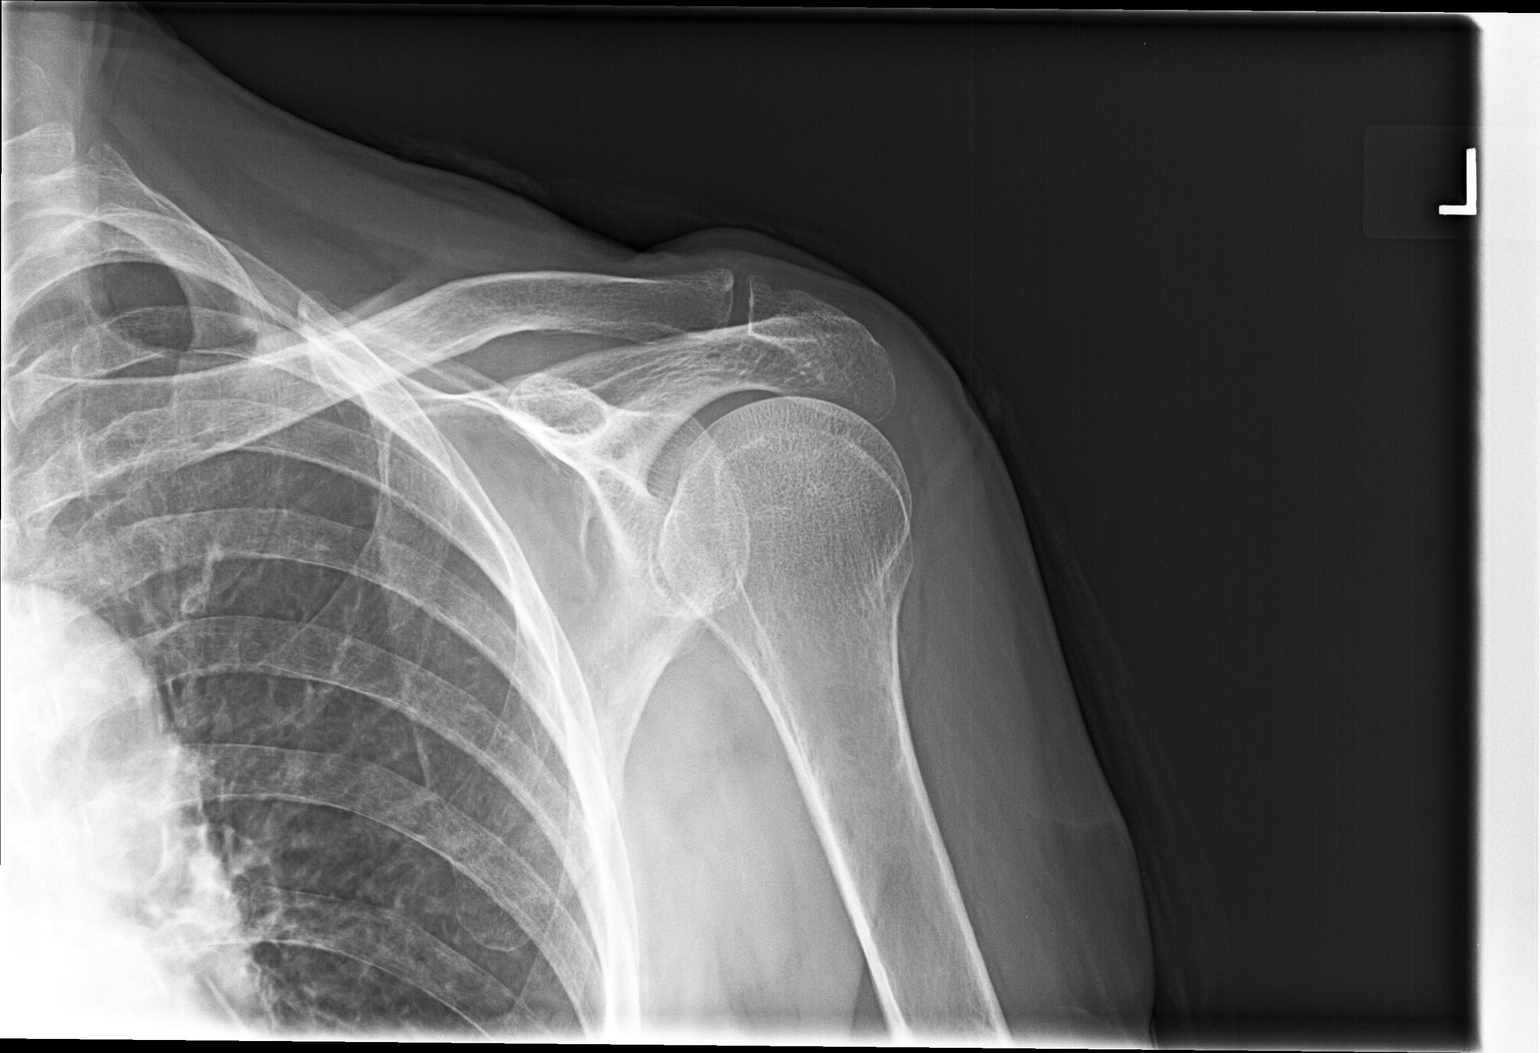

[view not recorded (3 of 3)]
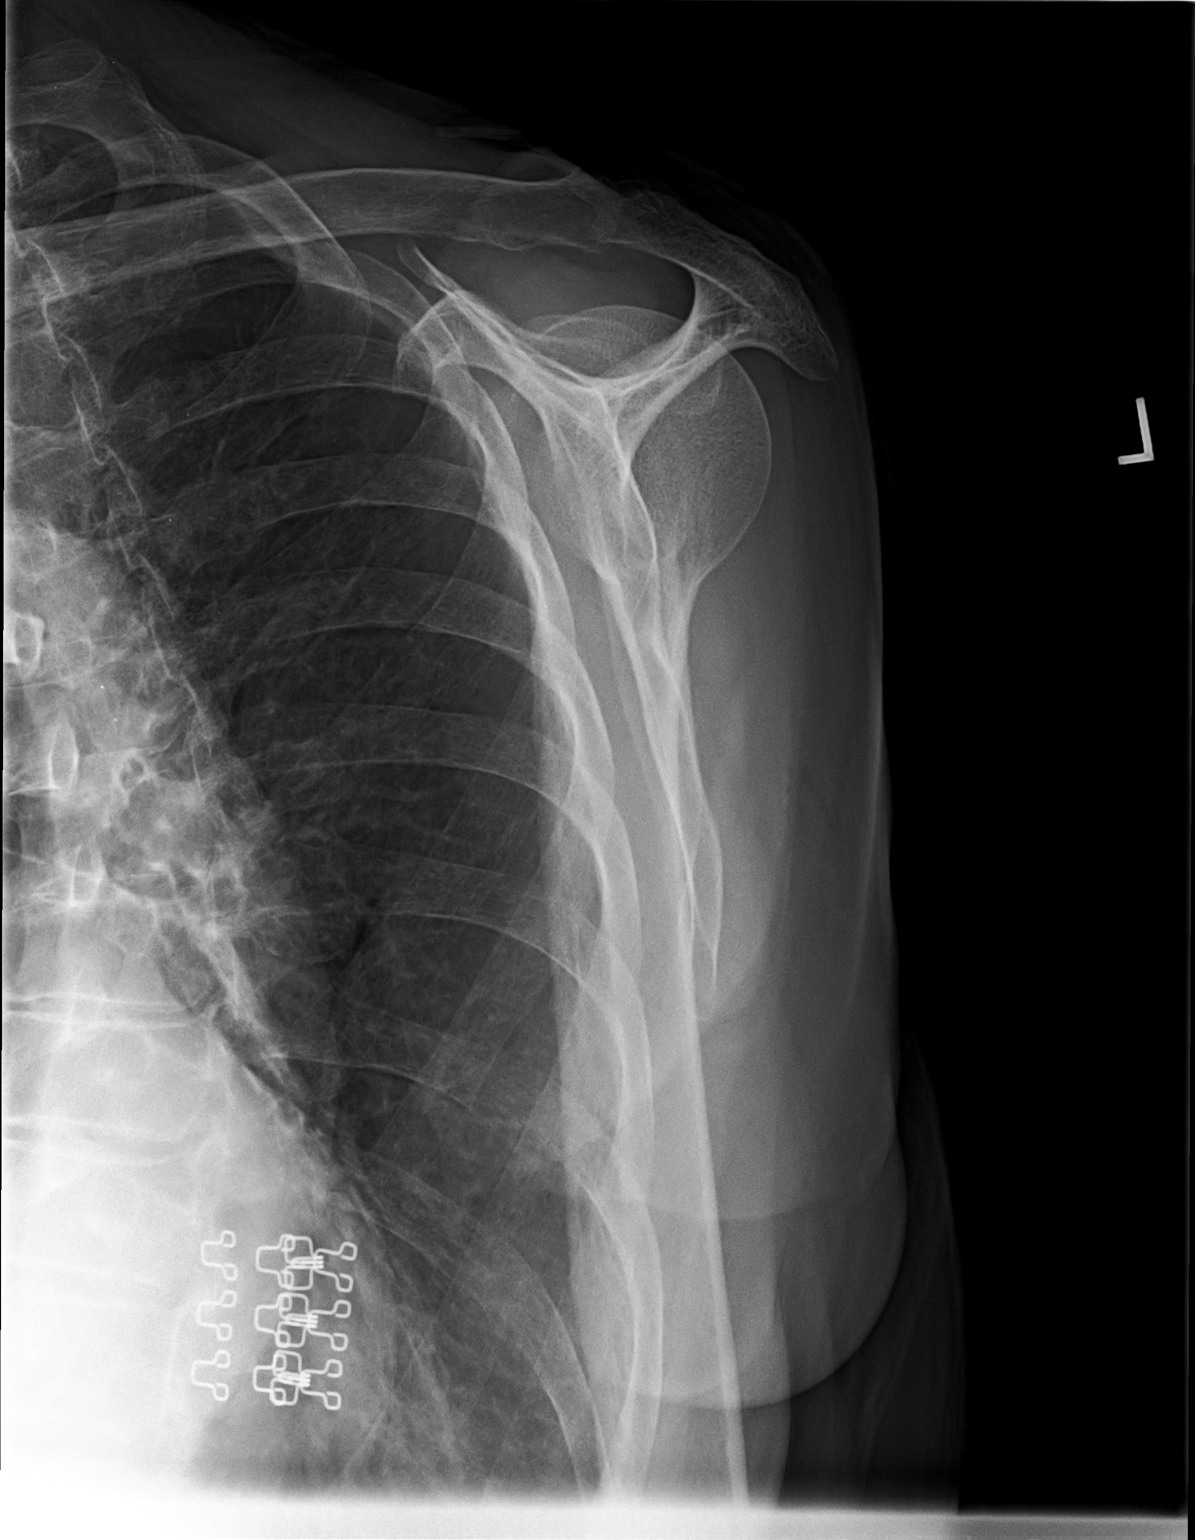

[3 of 3 positions shown; findings below may reference images not displayed]

FINDINGS: The bones of the shoulder are adequately mineralized. The
glenohumeral joint is normal in appearance. There is minimal
spurring of the distal clavicle superiorly at the AC joint the
observed portions of the left clavicle and upper left ribs are
normal.
IMPRESSION: There is no acute or significant chronic bony abnormality of the
left shoulder.

## 2016-04-22 ENCOUNTER — Other Ambulatory Visit: Payer: Self-pay | Admitting: Family Medicine

## 2016-05-20 ENCOUNTER — Other Ambulatory Visit: Payer: Self-pay | Admitting: Family Medicine

## 2016-06-13 ENCOUNTER — Encounter: Payer: Self-pay | Admitting: *Deleted

## 2016-06-13 NOTE — Progress Notes (Signed)
Cardiology Office Note  Date: 06/14/2016   ID: Brianna [name], DOB 07-30-1925, MRN [id number]  PCP: Redge Gainer, MD  Primary Cardiologist: Rozann Lesches, MD   Chief Complaint  Patient presents with  . Mitral Regurgitation    History of Present Illness: Brianna [name] is an 82 y.o. female last seen in February. She presents for a routine follow-up visit. Reports no change in mild dyspnea on exertion, no palpitations or syncope.  She continues to follow with Dr. Laurance Flatten, I reviewed the recent note from June.  We have continued conservative management of mitral valve disease including previously documented moderate to severe mitral regurgitation in 2014. She has not been inclined to pursue aggressive workup or follow-up imaging studies.  Cardiac murmur has been stable over time. She has used SBE prophylaxis antibiotics with dental work, and although not absolutely indicated by the most recent guidelines, would continue to use her standard treatment with amoxicillin as a bout of endocarditis could be fairly devastating for her.  Past Medical History:  Diagnosis Date  . Bilateral inguinal hernia (BIH)   . Carpal tunnel syndrome on right   . Colon polyp   . COPD (chronic obstructive pulmonary disease) ([place])   . Essential hypertension, benign   . Fibrocystic breast   . Frequency of urination   . History of skin cancer   . Hypothyroidism   . Lumbosacral spondylosis without myelopathy   . Mixed hyperlipidemia   . Myxomatous mitral valve    Mild prolapse with eccentric, moderate to severe regurgitation  . Obturator hernia, right   . Osteoporosis   . PONV (postoperative nausea and vomiting)   . 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 Symptomatic menopausal or female climacteric states   . Ventral hernia     Past Surgical History:  Procedure Laterality Date  . APPENDECTOMY    . CATARACT EXTRACTION W/ INTRAOCULAR LENS  IMPLANT, BILATERAL    . CESAREAN SECTION    . COLON  SURGERY  2004   8'04-open LAR for rectosigmoid cancer T1N0 within polyp  . INGUINAL HERNIA REPAIR  11/21/2011   Procedure: LAPAROSCOPIC BILATERAL INGUINAL HERNIA REPAIR;  Surgeon: Adin Hector, MD;  Location: [facility];  Service: General;  Laterality: N/A;  . LAPAROSCOPY  11/21/2011   R obturator hernia repair  . TONSILLECTOMY    . TOTAL HIP ARTHROPLASTY Right 03/04/2013   Procedure: RIGHT TOTAL HIP ARTHROPLASTY ANTERIOR APPROACH;  Surgeon: Mauri Pole, MD;  Location: [facility];  Service: Orthopedics;  Laterality: Right;  . VENTRAL HERNIA REPAIR  11/21/2011   Procedure: LAPAROSCOPIC VENTRAL HERNIA;  Surgeon: Adin Hector, MD;  Location: [facility];  Service: General;  Laterality: N/A;  laparoscopic bilateral inguinal hernia repair with mesh and ventral hernia repair with mesh    Current Outpatient Prescriptions  Medication Sig Dispense Refill  . acetaminophen (TYLENOL) 500 MG tablet Take 500 mg by mouth every 6 (six) hours as needed. Pain      . aspirin 81 MG tablet Take 81 mg by mouth 3 (three) times a week.    Marland Kitchen atorvastatin (LIPITOR) 10 MG tablet TAKE 1/2 TABLET AT BEDTIME 45 tablet 1  . beta carotene w/minerals (OCUVITE) tablet Take 1 tablet by mouth daily.      . Calcium Citrate-Vitamin D (CITRACAL + D PO) Take 2 tablets by mouth daily.     . Cholecalciferol (VITAMIN D3) 2000 UNITS TABS Take 1 tablet by mouth daily.     Marland Kitchen  hydrochlorothiazide (MICROZIDE) 12.5 MG capsule Take 1 capsule (12.5 mg total) by mouth daily. 90 capsule 3  . levothyroxine (SYNTHROID, LEVOTHROID) 25 MCG tablet TAKE 2 TABLETS DAILY MONDAY-FRIDAY TAKE 1 TABLET DAILY SATURDAY & SUNDAY 154 tablet 3  . losartan (COZAAR) 100 MG tablet TAKE ONE (1) TABLET EACH DAY 90 tablet 1  . Omega-3 Fatty Acids (FISH OIL PO) Take 1 tablet by mouth daily.     No current facility-administered medications for this visit.    Allergies:  Alendronate sodium; Lodine [etodolac]; and Risedronate sodium   Social History: The patient  reports  that she has never smoked. She has never used smokeless tobacco. She reports that she does not drink alcohol or use drugs.   ROS:  Please see the history of present illness. Otherwise, complete review of systems is positive for decreased hearing, arthritis pains.  All other systems are reviewed and negative.   Physical Exam: VS:  BP 138/72   Pulse 92   Ht 5\' 2"  (1.575 m)   Wt 122 lb (55.3 kg)   SpO2 97%   BMI 22.31 kg/m , BMI Body mass index is 22.31 kg/m.  Wt Readings from Last 3 Encounters:  06/14/16 122 lb (55.3 kg)  04/05/16 121 lb (54.9 kg)  12/10/15 124 lb (56.2 kg)    Patient in no acute distress.  HEENT: Conjunctiva and lids normal, oropharynx clear.  Neck: Supple, no elevated JVP or carotid bruits, no thyromegaly.  Lungs: Clear to auscultation, nonlabored breathing at rest.  Cardiac: Regular rate and rhythm, no S3, 99991111 systolic murmur in mid systole mainly in the lateral thorax and into the back, no pericardial rub.  Abdomen: Soft, nontender, bowel sounds present.  Extremities: No pitting edema, distal pulses 2+. Skin: Warm and dry. Musculoskeletal: Mild kyphosis noted. Neuropsychiatric: Alert and oriented 3, affect appropriate.  ECG: I personally reviewed the tracing from 06/14/2015 which showed sinus rhythm second-degree type I heart block and PVC, left atrial enlargement, nonspecific T-wave changes.  Recent Labwork: 04/05/2016: ALT 14; AST 22; BUN 26; Creatinine, Ser 1.07; Platelets 199; Potassium 3.9; Sodium 139; TSH 3.040     Component Value Date/Time   CHOL 194 04/05/2016 0939   TRIG 55 04/05/2016 0939   TRIG 62 08/24/2014 1230   HDL 86 04/05/2016 0939   HDL 72 08/24/2014 1230   CHOLHDL 2.3 04/05/2016 0939   CHOLHDL 2.7 03/25/2013 1255   VLDL 12 03/25/2013 1255   LDLCALC 97 04/05/2016 0939   LDLCALC 79 08/21/2013 0838    Other Studies Reviewed Today:  Echocardiogram April 2014: Mild LVH with LVEF [id number], grade 1 diastolic dysfunction, mildly  calcified aortic valve with mild aortic regurgitation, myxomatous mitral valve with mild prolapse and moderate to severe eccentric mitral regurgitation, moderate left atrial enlargement, mild right atrial enlargement, mild tricuspid regurgitation with PASP 32 mmHg.  Assessment and Plan:  1. Mitral valve prolapse with moderate to severe mitral regurgitation. Continuing conservative management, and fortunately with stable symptoms at this point. I recommended that she continue using amoxicillin for SBE prophylaxis with dental work as outlined above.  2. Essential hypertension, no changes made to current regimen with systolic blood pressure in the 130s. She is on Cozaar and HCTZ.  3. Hyperlipidemia, continues on Lipitor with last LDL 97.  4. History of hypothyroidism, on Synthroid with recent TSH normal range.   Current medicines were reviewed with the patient today.   Orders Placed This Encounter  Procedures  . EKG 12-Lead    Disposition: Follow-up  with me in 6 months.  Signed, Satira Sark, MD, [facility] 06/14/2016 11:43 AM    [facility] [place], [place], [place] [address] Phone: [phone]; Fax: [phone]

## 2016-06-14 ENCOUNTER — Ambulatory Visit (INDEPENDENT_AMBULATORY_CARE_PROVIDER_SITE_OTHER): Payer: Medicare Other | Admitting: Cardiology

## 2016-06-14 ENCOUNTER — Encounter: Payer: Self-pay | Admitting: *Deleted

## 2016-06-14 ENCOUNTER — Encounter: Payer: Self-pay | Admitting: Cardiology

## 2016-06-14 VITALS — BP 138/72 | HR 92 | Ht 62.0 in | Wt 122.0 lb

## 2016-06-14 DIAGNOSIS — I10 Essential (primary) hypertension: Secondary | ICD-10-CM

## 2016-06-14 DIAGNOSIS — E782 Mixed hyperlipidemia: Secondary | ICD-10-CM

## 2016-06-14 DIAGNOSIS — E039 Hypothyroidism, unspecified: Secondary | ICD-10-CM | POA: Diagnosis not present

## 2016-06-14 DIAGNOSIS — I059 Rheumatic mitral valve disease, unspecified: Secondary | ICD-10-CM

## 2016-06-14 NOTE — Patient Instructions (Signed)

## 2016-07-19 DIAGNOSIS — H353131 Nonexudative age-related macular degeneration, bilateral, early dry stage: Secondary | ICD-10-CM | POA: Diagnosis not present

## 2016-08-08 ENCOUNTER — Other Ambulatory Visit: Payer: Self-pay | Admitting: *Deleted

## 2016-08-08 MED ORDER — HYDROCHLOROTHIAZIDE 12.5 MG PO CAPS
12.5000 mg | ORAL_CAPSULE | Freq: Every day | ORAL | 3 refills | Status: DC
Start: 2016-08-08 — End: 2017-05-03

## 2016-09-01 DIAGNOSIS — Z23 Encounter for immunization: Secondary | ICD-10-CM | POA: Diagnosis not present

## 2016-09-25 ENCOUNTER — Other Ambulatory Visit: Payer: Self-pay | Admitting: Family Medicine

## 2016-10-03 ENCOUNTER — Encounter: Payer: Self-pay | Admitting: Family Medicine

## 2016-10-03 ENCOUNTER — Ambulatory Visit (INDEPENDENT_AMBULATORY_CARE_PROVIDER_SITE_OTHER): Payer: Medicare Other | Admitting: Family Medicine

## 2016-10-03 VITALS — BP 160/65 | HR 80 | Temp 97.8°F | Ht 62.0 in | Wt 121.0 lb

## 2016-10-03 DIAGNOSIS — G8929 Other chronic pain: Secondary | ICD-10-CM

## 2016-10-03 DIAGNOSIS — E7800 Pure hypercholesterolemia, unspecified: Secondary | ICD-10-CM

## 2016-10-03 DIAGNOSIS — M25552 Pain in left hip: Secondary | ICD-10-CM | POA: Diagnosis not present

## 2016-10-03 DIAGNOSIS — E559 Vitamin D deficiency, unspecified: Secondary | ICD-10-CM | POA: Diagnosis not present

## 2016-10-03 DIAGNOSIS — G629 Polyneuropathy, unspecified: Secondary | ICD-10-CM

## 2016-10-03 DIAGNOSIS — C189 Malignant neoplasm of colon, unspecified: Secondary | ICD-10-CM | POA: Diagnosis not present

## 2016-10-03 DIAGNOSIS — M25562 Pain in left knee: Secondary | ICD-10-CM

## 2016-10-03 DIAGNOSIS — M25561 Pain in right knee: Secondary | ICD-10-CM

## 2016-10-03 DIAGNOSIS — M5030 Other cervical disc degeneration, unspecified cervical region: Secondary | ICD-10-CM | POA: Diagnosis not present

## 2016-10-03 DIAGNOSIS — M25551 Pain in right hip: Secondary | ICD-10-CM

## 2016-10-03 DIAGNOSIS — I700 Atherosclerosis of aorta: Secondary | ICD-10-CM

## 2016-10-03 DIAGNOSIS — E039 Hypothyroidism, unspecified: Secondary | ICD-10-CM

## 2016-10-03 DIAGNOSIS — I10 Essential (primary) hypertension: Secondary | ICD-10-CM

## 2016-10-03 NOTE — Progress Notes (Signed)
Subjective:    Patient ID: Brianna [name], female    DOB: 1924-12-01, 82 y.o.   MRN: [id number]  HPI Pt here for follow up and management of chronic medical problems which includes hypertension and hyperlipidemia. She is taking medications regularly.The patient is doing well overall and has no complaints other than some generalized arthralgias. She will get traditional lab work today. Her blood pressure initially was elevated today. Her weight appears to be stable. The patient is pleasant and alert and may be somewhat decreased memory. She says she is checked on every day by her daughter. The daughter lives just a few houses from her. She denies any chest pain pressure or palpitations. She has some shortness of breath but no more than would be expected. She has no trouble with her GI tract including nausea vomiting diarrhea or blood in the stool. She says she is passing her water regularly and frequently but no burning or pain. She does follow-up periodically with the cardiologist and with orthopedic surgeon. She does not go back to see the gastroenterologist unless she has any more problems.     Patient Active Problem List   Diagnosis Date Noted  . Enlarged heart 04/08/2015  . Thoracic aortic atherosclerosis ([place]) 04/08/2015  . Osteoporosis, post-menopausal 11/25/2014  . Bilateral hand pain 04/13/2014  . Vitamin D deficiency 08/18/2013  . Colon cancer ([employer]) 08/18/2013  . Mitral valve disease 05/27/2013  . Hyperlipidemia 03/25/2013  . Expected blood loss anemia 03/05/2013  . S/P right THA, AA 03/04/2013  . Essential hypertension, benign 02/16/2013  . COPD (chronic obstructive pulmonary disease) ([place]) 02/16/2013  . Arthritis 10/12/2011   Outpatient Encounter Prescriptions as of 10/03/2016  Medication Sig  . acetaminophen (TYLENOL) 500 MG tablet Take 500 mg by mouth every 6 (six) hours as needed. Pain    . aspirin 81 MG tablet Take 81 mg by mouth 3 (three) times a week.  Marland Kitchen atorvastatin  (LIPITOR) 10 MG tablet TAKE 1/2 TABLET AT BEDTIME  . beta carotene w/minerals (OCUVITE) tablet Take 1 tablet by mouth daily.    . Calcium Citrate-Vitamin D (CITRACAL + D PO) Take 2 tablets by mouth daily.   . Cholecalciferol (VITAMIN D3) 2000 UNITS TABS Take 1 tablet by mouth daily.   . hydrochlorothiazide (MICROZIDE) 12.5 MG capsule Take 1 capsule (12.5 mg total) by mouth daily.  Marland Kitchen levothyroxine (SYNTHROID, LEVOTHROID) 25 MCG tablet TAKE 2 TABLETS DAILY MONDAY-FRIDAY TAKE 1 TABLET DAILY SATURDAY & SUNDAY  . losartan (COZAAR) 100 MG tablet TAKE ONE (1) TABLET EACH DAY  . Omega-3 Fatty Acids (FISH OIL PO) Take 1 tablet by mouth daily.   No facility-administered encounter medications on file as of 10/03/2016.       Review of Systems  Constitutional: Negative.   HENT: Negative.   Eyes: Negative.   Respiratory: Negative.   Cardiovascular: Negative.   Gastrointestinal: Negative.   Endocrine: Negative.   Genitourinary: Negative.   Musculoskeletal: Positive for arthralgias (general).  Skin: Negative.   Allergic/Immunologic: Negative.   Neurological: Negative.   Hematological: Negative.   Psychiatric/Behavioral: Negative.        Objective:   Physical Exam  Constitutional: She is oriented to person, place, and time. She appears well-developed and well-nourished. No distress.  Small framed and pleasant and much younger looking than her stated age  HENT:  Head: Normocephalic and atraumatic.  Right Ear: External ear normal.  Left Ear: External ear normal.  Mouth/Throat: No oropharyngeal exudate.  Slight nasal congestion  Eyes:  Conjunctivae and EOM are normal. Pupils are equal, round, and reactive to light. Right eye exhibits no discharge. Left eye exhibits no discharge. No scleral icterus.  Neck: Normal range of motion. Neck supple. No thyromegaly present.  No bruits thyromegaly or anterior cervical adenopathy  Cardiovascular: Normal rate, normal heart sounds and intact distal pulses.    No murmur heard. The heart was slightly irregular at 72/m  Pulmonary/Chest: Effort normal and breath sounds normal. No respiratory distress. She has no wheezes. She has no rales.  Clear anteriorly and posteriorly  Abdominal: Soft. Bowel sounds are normal. She exhibits no mass. There is no tenderness. There is no rebound and no guarding.  No abdominal tenderness or organ enlargement bruits or masses or inguinal adenopathy with good inguinal pulses  Musculoskeletal: She exhibits no edema.  Some stiffness and discomfort with movement of right knee.  Lymphadenopathy:    She has no cervical adenopathy.  Neurological: She is alert and oriented to person, place, and time. She has normal reflexes. No cranial nerve deficit.  Skin: Skin is warm and dry. No rash noted.  Psychiatric: She has a normal mood and affect. Her behavior is normal. Judgment and thought content normal.  Nursing note and vitals reviewed.  BP (!) 160/65 (BP Location: Left Arm)   Pulse 80   Temp 97.8 F (36.6 C) (Oral)   Ht '5\' 2"'  (1.575 m)   Wt 121 lb (54.9 kg)   BMI 22.13 kg/m         Assessment & Plan:  1. Vitamin D deficiency -Continue with current treatment pending results of lab work - CBC with Differential/Platelet - VITAMIN D 25 Hydroxy (Vit-D Deficiency, Fractures)  2. Pure hypercholesterolemia -Continue with atorvastatin and omega-3 fatty acids and aggressive therapeutic lifestyle changes - CBC with Differential/Platelet - Lipid panel  3. Essential hypertension, benign -Watch sodium intake. Check blood pressures at home and bring these readings with you for a visit with the nurse in about 4 weeks to recheck her blood pressure here - BMP8+EGFR - CBC with Differential/Platelet - Hepatic function panel  4. Thoracic aortic atherosclerosis ([place]) -Continue with aggressive therapeutic lifestyle changes and current cholesterol treatment - CBC with Differential/Platelet - Lipid panel  5. Hypothyroidism,  unspecified type -Continue with current treatment pending results of lab work - CBC with Differential/Platelet - Thyroid Panel With TSH  6. Neuropathy ([place]) -No significant complaints with this today.  7. DDD (degenerative disc disease), cervical -No specific complaints with these today.  8. Malignant neoplasm of colon, unspecified part of colon ([place]) -The patient was told not to come back to the gastroenterologist unless she had any particular problems related to blood in the stool.  9. Chronic arthralgias of knees and hips -Take Tylenol for aches pains and fever  Patient Instructions                       Medicare Annual Wellness Visit  [facility] and the medical providers at [facility] strive to bring you the best medical care.  In doing so we not only want to address your current medical conditions and concerns but also to detect new conditions early and prevent illness, disease and health-related problems.    Medicare offers a yearly Wellness Visit which allows our clinical staff to assess your need for preventative services including immunizations, lifestyle education, counseling to decrease risk of preventable diseases and screening for fall risk and other medical concerns.    This visit  is provided free of charge (no copay) for all Medicare recipients. The clinical pharmacists at [facility] have begun to conduct these Wellness Visits which will also include a thorough review of all your medications.    As you primary medical provider recommend that you make an appointment for your Annual Wellness Visit if you have not done so already this year.  You may set up this appointment before you leave today or you may call back [phone]) and schedule an appointment.  Please make sure when you call that you mention that you are scheduling your Annual Wellness Visit with the clinical pharmacist so that the appointment may be made for the  proper length of time.     Continue current medications. Continue good therapeutic lifestyle changes which include good diet and exercise. Fall precautions discussed with patient. If an FOBT was given today- please return it to our front desk. If you are over 35 years old - you may need Prevnar 70 or the adult Pneumonia vaccine.  **Flu shots are available--- please call and schedule a FLU-CLINIC appointment**  After your visit with [place] today you will receive a survey in the mail or online from [employer] regarding your care with [place]. Please take a moment to fill this out. Your feedback is very important to [place] as you can help [place] better understand your patient needs as well as improve your experience and satisfaction. WE CARE ABOUT YOU!!!   Check some blood pressures at home and bring these readings back to the office in about 4 weeks and have the nurse check your blood pressure here Watch your sodium intake Show your daughter the [place] brochure as I think it would be good for you to have one of these necklaces that you can wear at home at K she fall and you cannot get to the phone---- this is important. Continue to follow-up with cardiology and orthopedics as planned Take Tylenol for aches and pains   Arrie Senate MD

## 2016-10-03 NOTE — Patient Instructions (Addendum)
Medicare Annual Wellness Visit  [facility] and the medical providers at [facility] strive to bring you the best medical care.  In doing so we not only want to address your current medical conditions and concerns but also to detect new conditions early and prevent illness, disease and health-related problems.    Medicare offers a yearly Wellness Visit which allows our clinical staff to assess your need for preventative services including immunizations, lifestyle education, counseling to decrease risk of preventable diseases and screening for fall risk and other medical concerns.    This visit is provided free of charge (no copay) for all Medicare recipients. The clinical pharmacists at [facility] have begun to conduct these Wellness Visits which will also include a thorough review of all your medications.    As you primary medical provider recommend that you make an appointment for your Annual Wellness Visit if you have not done so already this year.  You may set up this appointment before you leave today or you may call back [phone] and schedule an appointment.  Please make sure when you call that you mention that you are scheduling your Annual Wellness Visit with the clinical pharmacist so that the appointment may be made for the proper length of time.     Continue current medications. Continue good therapeutic lifestyle changes which include good diet and exercise. Fall precautions discussed with patient. If an FOBT was given today- please return it to our front desk. If you are over 26 years old - you may need Prevnar 54 or the adult Pneumonia vaccine.  **Flu shots are available--- please call and schedule a FLU-CLINIC appointment**  After your visit with [place] today you will receive a survey in the mail or online from [employer] regarding your care with [place]. Please take a moment to fill this out. Your feedback is very  important to [place] as you can help [place] better understand your patient needs as well as improve your experience and satisfaction. WE CARE ABOUT YOU!!!   Check some blood pressures at home and bring these readings back to the office in about 4 weeks and have the nurse check your blood pressure here Watch your sodium intake Show your daughter the [place] brochure as I think it would be good for you to have one of these necklaces that you can wear at home at K she fall and you cannot get to the phone---- this is important. Continue to follow-up with cardiology and orthopedics as planned Take Tylenol for aches and pains

## 2016-10-04 LAB — CBC WITH DIFFERENTIAL/PLATELET
BASOS ABS: 0 10*3/uL (ref 0.0–0.2)
Basos: 1 %
EOS (ABSOLUTE): 0.1 10*3/uL (ref 0.0–0.4)
Eos: 2 %
HEMATOCRIT: 36.4 % (ref 34.0–46.6)
Hemoglobin: 12 g/dL (ref 11.1–15.9)
IMMATURE GRANS (ABS): 0 10*3/uL (ref 0.0–0.1)
Immature Granulocytes: 0 %
LYMPHS: 27 %
Lymphocytes Absolute: 1.1 10*3/uL (ref 0.7–3.1)
MCH: 30.9 pg (ref 26.6–33.0)
MCHC: 33 g/dL (ref 31.5–35.7)
MCV: 94 fL (ref 79–97)
MONOCYTES: 15 %
Monocytes Absolute: 0.6 10*3/uL (ref 0.1–0.9)
Neutrophils Absolute: 2.3 10*3/uL (ref 1.4–7.0)
Neutrophils: 55 %
Platelets: 210 10*3/uL (ref 150–379)
RBC: 3.88 x10E6/uL (ref 3.77–5.28)
RDW: 13.8 % (ref 12.3–15.4)
WBC: 4.1 10*3/uL (ref 3.4–10.8)

## 2016-10-04 LAB — HEPATIC FUNCTION PANEL
ALBUMIN: 4.2 g/dL (ref 3.5–4.7)
ALT: 13 IU/L (ref 0–32)
AST: 18 IU/L (ref 0–40)
Alkaline Phosphatase: 66 IU/L (ref 39–117)
Bilirubin Total: 0.3 mg/dL (ref 0.0–1.2)
Bilirubin, Direct: 0.12 mg/dL (ref 0.00–0.40)
TOTAL PROTEIN: 6.7 g/dL (ref 6.0–8.5)

## 2016-10-04 LAB — BMP8+EGFR
BUN/Creatinine Ratio: 27 (ref 12–28)
BUN: 25 mg/dL (ref 8–27)
CALCIUM: 9.8 mg/dL (ref 8.7–10.3)
CHLORIDE: 99 mmol/L (ref 96–106)
CO2: 27 mmol/L (ref 18–29)
Creatinine, Ser: 0.91 mg/dL (ref 0.57–1.00)
GFR calc Af Amer: 65 mL/min/{1.73_m2} (ref >59)
GFR calc non Af Amer: 56 mL/min/{1.73_m2} — ABNORMAL LOW (ref >59)
GLUCOSE: 88 mg/dL (ref 65–99)
Potassium: 4.2 mmol/L (ref 3.5–5.2)
Sodium: 138 mmol/L (ref 134–144)

## 2016-10-04 LAB — THYROID PANEL WITH TSH
Free Thyroxine Index: 2.1 (ref 1.2–4.9)
T3 UPTAKE RATIO: 28 % (ref 24–39)
T4, Total: 7.6 ug/dL (ref 4.5–12.0)
TSH: 1.51 u[IU]/mL (ref 0.450–4.500)

## 2016-10-04 LAB — LIPID PANEL
CHOL/HDL RATIO: 2.3 ratio (ref 0.0–4.4)
CHOLESTEROL TOTAL: 182 mg/dL (ref 100–199)
HDL: 80 mg/dL (ref >39)
LDL Calculated: 86 mg/dL (ref 0–99)
TRIGLYCERIDES: 79 mg/dL (ref 0–149)
VLDL Cholesterol Cal: 16 mg/dL (ref 5–40)

## 2016-10-04 LAB — VITAMIN D 25 HYDROXY (VIT D DEFICIENCY, FRACTURES): Vit D, 25-Hydroxy: 69.1 ng/mL (ref 30.0–100.0)

## 2016-10-05 ENCOUNTER — Telehealth: Payer: Self-pay | Admitting: Family Medicine

## 2016-10-05 NOTE — Telephone Encounter (Signed)
Pt has result note open so will close this encounter and left a comment in result note to call back this afternoon

## 2016-11-14 ENCOUNTER — Other Ambulatory Visit: Payer: Self-pay | Admitting: Family Medicine

## 2016-12-19 ENCOUNTER — Ambulatory Visit (INDEPENDENT_AMBULATORY_CARE_PROVIDER_SITE_OTHER): Payer: Medicare Other | Admitting: Cardiology

## 2016-12-19 ENCOUNTER — Encounter: Payer: Self-pay | Admitting: Cardiology

## 2016-12-19 VITALS — BP 160/84 | HR 69 | Ht 62.0 in | Wt 121.0 lb

## 2016-12-19 DIAGNOSIS — I059 Rheumatic mitral valve disease, unspecified: Secondary | ICD-10-CM

## 2016-12-19 DIAGNOSIS — I10 Essential (primary) hypertension: Secondary | ICD-10-CM

## 2016-12-19 NOTE — Progress Notes (Signed)
Cardiology Office Note  Date: 12/19/2016   ID: Lean, [name] 1925-06-01, MRN [id number]  PCP: Brianna Gainer, MD  Primary Cardiologist: Brianna Lesches, MD   Chief Complaint  Patient presents with  . Mitral valve disease    History of Present Illness: Brianna [name] is a 83 y.o. female last seen in August 2017. She presents for a routine follow-up visit. Reports no major change in stamina. Has been limited by progressive arthritic pain, particularly in the colder weather.  We have continued conservative management of mitral valve disease including previously documented moderate to severe mitral regurgitation in 2014. She has not been inclined to pursue aggressive workup or follow-up imaging studies. Fortunately, she has in stable symptomatically.  I reviewed her medications which are outlined below. She remains on aspirin, Lipitor, HCTZ, and Cozaar. She had lab work with Dr. Laurance [name] recently which I reviewed.  Past Medical History:  Diagnosis Date  . Bilateral inguinal hernia ([facility])   . Carpal tunnel syndrome on right   . Colon polyp   . COPD (chronic obstructive pulmonary disease) ([place])   . Essential hypertension, benign   . Fibrocystic breast   . Frequency of urination   . History of skin cancer   . Hypothyroidism   . Lumbosacral spondylosis without myelopathy   . Mixed hyperlipidemia   . Myxomatous mitral valve    Mild prolapse with eccentric, moderate to severe regurgitation  . Obturator hernia, right   . Osteoporosis   . PONV (postoperative nausea and vomiting)   . 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 Symptomatic menopausal or female climacteric states   . Ventral hernia     Current Outpatient Prescriptions  Medication Sig Dispense Refill  . acetaminophen (TYLENOL) 500 MG tablet Take 500 mg by mouth every 6 (six) hours as needed. Pain      . aspirin 81 MG tablet Take 81 mg by mouth 3 (three) times a week.    Marland Kitchen atorvastatin (LIPITOR) 10 MG tablet  TAKE 1/2 TABLET AT BEDTIME 45 tablet 0  . beta carotene w/minerals (OCUVITE) tablet Take 1 tablet by mouth daily.      . Calcium Citrate-Vitamin D (CITRACAL + D PO) Take 2 tablets by mouth daily.     . Cholecalciferol (VITAMIN D3) 2000 UNITS TABS Take 1 tablet by mouth daily.     . hydrochlorothiazide (MICROZIDE) 12.5 MG capsule Take 1 capsule (12.5 mg total) by mouth daily. 90 capsule 3  . levothyroxine (SYNTHROID, LEVOTHROID) 25 MCG tablet TAKE 2 TABLETS DAILY MONDAY-FRIDAY TAKE 1 TABLET DAILY SATURDAY & SUNDAY 154 tablet 3  . losartan (COZAAR) 100 MG tablet TAKE ONE (1) TABLET EACH DAY 90 tablet 1  . Omega-3 Fatty Acids (FISH OIL PO) Take 1 tablet by mouth daily.     No current facility-administered medications for this visit.    Allergies:  Alendronate sodium; Lodine [etodolac]; and Risedronate sodium   Social History: The patient  reports that she has never smoked. She has never used smokeless tobacco. She reports that she does not drink alcohol or use drugs.   ROS:  Please see the history of present illness. Otherwise, complete review of systems is positive for arthritic stiffness and pain.  All other systems are reviewed and negative.   Physical Exam: VS:  BP (!) 160/84   Pulse 69   Ht 5\' 2"  (1.575 m)   Wt 121 lb (54.9 kg)   SpO2 98%   BMI 22.13 kg/m ,  BMI Body mass index is 22.13 kg/m.  Wt Readings from Last 3 Encounters:  12/19/16 121 lb (54.9 kg)  10/03/16 121 lb (54.9 kg)  06/14/16 122 lb (55.3 kg)    Elderly woman, appears comfortable at rest. HEENT: Conjunctiva and lids normal, oropharynx clear.  Neck: Supple, no elevated JVP or carotid bruits, no thyromegaly.  Lungs: Clear to auscultation, nonlabored breathing at rest.  Cardiac: Regular rate and rhythm with ectopy, no S3, 99991111 systolic murmur in mid systole mainly in the lateral thorax and into the back, no pericardial rub.  Abdomen: Soft, nontender, bowel sounds present.  Extremities: No pitting edema,  distal pulses 2+.  ECG: I personally reviewed the tracing from 06/14/2016 which showed sinus rhythm with low voltage.  Recent Labwork: 10/03/2016: ALT 13; AST 18; BUN 25; Creatinine, Ser 0.91; Platelets 210; Potassium 4.2; Sodium 138; TSH 1.510     Component Value Date/Time   CHOL 182 10/03/2016 1058   TRIG 79 10/03/2016 1058   TRIG 62 08/24/2014 1230   HDL 80 10/03/2016 1058   HDL 72 08/24/2014 1230   CHOLHDL 2.3 10/03/2016 1058   CHOLHDL 2.7 03/25/2013 1255   VLDL 12 03/25/2013 1255   LDLCALC 86 10/03/2016 1058   LDLCALC 79 08/21/2013 0838    Other Studies Reviewed Today:  Echocardiogram April 2014: Mild LVH with LVEF [id number], grade 1 diastolic dysfunction, mildly calcified aortic valve with mild aortic regurgitation, myxomatous mitral valve with mild prolapse and moderate to severe eccentric mitral regurgitation, moderate left atrial enlargement, mild right atrial enlargement, mild tricuspid regurgitation with PASP 32 mmHg.  Assessment and Plan:  1. Mitral valve prolapse with moderate to severe mitral regurgitation based on prior assessment. She prefers a conservative approach without follow-up testing and fortunately remains clinically stable on present regimen. No major change on examination today.  2. Essential hypertension, keep follow-up with Dr. Laurance [name], no changes made in current regimen.  Current medicines were reviewed with the patient today.  Disposition: Follow-up in 6 months.  Signed, Brianna Sark, MD, [facility] 12/19/2016 11:48 AM    [facility] [place], [place], [place] [address] Phone: [phone]; Fax: [phone]

## 2016-12-19 NOTE — Patient Instructions (Signed)
Your physician wants you to follow-up in: 6 months with Dr. McDowell You will receive a reminder letter in the mail two months in advance. If you don't receive a letter, please call our office to schedule the follow-up appointment.  Your physician recommends that you continue on your current medications as directed. Please refer to the Current Medication list given to you today.  Thank you for choosing [facility]!!    

## 2016-12-26 ENCOUNTER — Other Ambulatory Visit: Payer: Self-pay | Admitting: Family Medicine

## 2017-01-23 DIAGNOSIS — H353131 Nonexudative age-related macular degeneration, bilateral, early dry stage: Secondary | ICD-10-CM | POA: Diagnosis not present

## 2017-01-23 DIAGNOSIS — H524 Presbyopia: Secondary | ICD-10-CM | POA: Diagnosis not present

## 2017-04-03 ENCOUNTER — Encounter: Payer: Self-pay | Admitting: Family Medicine

## 2017-04-03 ENCOUNTER — Ambulatory Visit (INDEPENDENT_AMBULATORY_CARE_PROVIDER_SITE_OTHER): Payer: Medicare Other

## 2017-04-03 ENCOUNTER — Ambulatory Visit (INDEPENDENT_AMBULATORY_CARE_PROVIDER_SITE_OTHER): Payer: Medicare Other | Admitting: Family Medicine

## 2017-04-03 VITALS — BP 154/80 | HR 66 | Temp 97.5°F | Ht 62.0 in | Wt 118.0 lb

## 2017-04-03 DIAGNOSIS — J410 Simple chronic bronchitis: Secondary | ICD-10-CM | POA: Diagnosis not present

## 2017-04-03 DIAGNOSIS — E559 Vitamin D deficiency, unspecified: Secondary | ICD-10-CM | POA: Diagnosis not present

## 2017-04-03 DIAGNOSIS — Z13820 Encounter for screening for osteoporosis: Secondary | ICD-10-CM

## 2017-04-03 DIAGNOSIS — I10 Essential (primary) hypertension: Secondary | ICD-10-CM | POA: Diagnosis not present

## 2017-04-03 DIAGNOSIS — I700 Atherosclerosis of aorta: Secondary | ICD-10-CM

## 2017-04-03 DIAGNOSIS — M19041 Primary osteoarthritis, right hand: Secondary | ICD-10-CM

## 2017-04-03 DIAGNOSIS — M25552 Pain in left hip: Secondary | ICD-10-CM

## 2017-04-03 DIAGNOSIS — M25561 Pain in right knee: Secondary | ICD-10-CM

## 2017-04-03 DIAGNOSIS — M25551 Pain in right hip: Secondary | ICD-10-CM

## 2017-04-03 DIAGNOSIS — Z780 Asymptomatic menopausal state: Secondary | ICD-10-CM

## 2017-04-03 DIAGNOSIS — M25562 Pain in left knee: Secondary | ICD-10-CM

## 2017-04-03 DIAGNOSIS — I499 Cardiac arrhythmia, unspecified: Secondary | ICD-10-CM

## 2017-04-03 DIAGNOSIS — E039 Hypothyroidism, unspecified: Secondary | ICD-10-CM | POA: Diagnosis not present

## 2017-04-03 DIAGNOSIS — E7800 Pure hypercholesterolemia, unspecified: Secondary | ICD-10-CM | POA: Diagnosis not present

## 2017-04-03 DIAGNOSIS — G8929 Other chronic pain: Secondary | ICD-10-CM

## 2017-04-03 DIAGNOSIS — M19042 Primary osteoarthritis, left hand: Secondary | ICD-10-CM

## 2017-04-03 DIAGNOSIS — C189 Malignant neoplasm of colon, unspecified: Secondary | ICD-10-CM | POA: Diagnosis not present

## 2017-04-03 DIAGNOSIS — M5030 Other cervical disc degeneration, unspecified cervical region: Secondary | ICD-10-CM | POA: Diagnosis not present

## 2017-04-03 NOTE — Progress Notes (Signed)
Subjective:    Patient ID: Brianna [name], female    DOB: [birth year], 83 y.o.   MRN: [id number]  HPI Pt here for follow up and management of chronic medical problems which includes hyperlipidemia and hypertension. She is taking medication regularly.This patient has a history of colon cancer. She complains today of arthritis symptoms and right knee pain. She is due to get a DEXA scan and this will hopefully be done today. She will be given an FOBT to return and will get lab work today. The patient today denies any chest pain or shortness of breath. She denies any trouble with her stomach including nausea vomiting diarrhea blood in the stool or black tarry bowel movements. She is passing her water without problems. She does take an occasional ibuprofen but she understands that this has more side effects and taking Tylenol. 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     Patient Active Problem List   Diagnosis Date Noted  . Enlarged heart 04/08/2015  . Thoracic aortic atherosclerosis ([place]) 04/08/2015  . Osteoporosis, post-menopausal 11/25/2014  . Bilateral hand pain 04/13/2014  . Vitamin D deficiency 08/18/2013  . Colon cancer ([place]) 08/18/2013  . Mitral valve disease 05/27/2013  . Hyperlipidemia 03/25/2013  . Expected blood loss anemia 03/05/2013  . S/P right THA, AA 03/04/2013  . Essential hypertension, benign 02/16/2013  . COPD (chronic obstructive pulmonary disease) ([place]) 02/16/2013  . Arthritis 10/12/2011   Outpatient Encounter Prescriptions as of 04/03/2017  Medication Sig  . acetaminophen (TYLENOL) 500 MG tablet Take 500 mg by mouth every 6 (six) hours as needed. Pain    . aspirin 81 MG tablet Take 81 mg by mouth 3 (three) times a week.  Marland Kitchen atorvastatin (LIPITOR) 10 MG tablet TAKE 1/2 TABLET AT BEDTIME  . beta carotene w/minerals (OCUVITE) tablet Take 1 tablet by mouth daily.    . Calcium  Citrate-Vitamin D (CITRACAL + D PO) Take 2 tablets by mouth daily.   . Cholecalciferol (VITAMIN D3) 2000 UNITS TABS Take 1 tablet by mouth daily.   . hydrochlorothiazide (MICROZIDE) 12.5 MG capsule Take 1 capsule (12.5 mg total) by mouth daily.  Marland Kitchen levothyroxine (SYNTHROID, LEVOTHROID) 25 MCG tablet TAKE 2 TABLETS DAILY MONDAY-FRIDAY TAKE 1 TABLET DAILY SATURDAY & SUNDAY  . losartan (COZAAR) 100 MG tablet TAKE ONE (1) TABLET EACH DAY  . Omega-3 Fatty Acids (FISH OIL PO) Take 1 tablet by mouth daily.   No facility-administered encounter medications on file as of 04/03/2017.      Review of Systems  Constitutional: Negative.   HENT: Negative.   Eyes: Negative.   Respiratory: Negative.   Cardiovascular: Negative.   Gastrointestinal: Negative.   Endocrine: Negative.   Genitourinary: Negative.   Musculoskeletal: Positive for arthralgias. Joint swelling: right knee pain.  Skin: Negative.   Allergic/Immunologic: Negative.   Neurological: Negative.   Hematological: Negative.   Psychiatric/Behavioral: Negative.        Objective:   Physical Exam  Constitutional: She is oriented to person, place, and time. She appears well-developed and well-nourished. No distress.  The patient is pleasant and relaxed and looks much younger than her stated age. Her children check on her daily. She does not use a [employer].  HENT:  Head: Normocephalic and atraumatic.  Right Ear: External ear normal.  Left Ear: External ear normal.  Nose: Nose normal.  Mouth/Throat: Oropharynx is clear and moist.  No oropharyngeal exudate.  Eyes: Conjunctivae and EOM are normal. Pupils are equal, round, and reactive to light. Right eye exhibits no discharge. Left eye exhibits no discharge. No scleral icterus.  Neck: Normal range of motion. Neck supple. No thyromegaly present.  The neck has no bruits thyromegaly or anterior cervical adenopathy  Cardiovascular: Normal rate, normal heart sounds and intact distal pulses.   No  murmur heard. Pedal pulses were present but somewhat difficult to palpate. The heart is irregular with occasional PVCs at 84/m  Pulmonary/Chest: Effort normal and breath sounds normal. No respiratory distress. She has no wheezes. She has no rales. She exhibits no tenderness.  Clear anteriorly and posteriorly  Abdominal: Soft. Bowel sounds are normal. She exhibits no mass. There is no tenderness. There is no rebound and no guarding.  No liver or spleen enlargement no inguinal adenopathy no masses and no bruits  Musculoskeletal: Normal range of motion. She exhibits tenderness. She exhibits no edema.  Arthritic changes in both hands. Bilateral joint line pain in the right knee.  Lymphadenopathy:    She has no cervical adenopathy.  Neurological: She is alert and oriented to person, place, and time. She has normal reflexes. No cranial nerve deficit.  Skin: Skin is warm and dry. No rash noted.  Psychiatric: She has a normal mood and affect. Her behavior is normal. Judgment and thought content normal.  Nursing note and vitals reviewed.   BP (!) 155/67 (BP Location: Left Arm)   Pulse 66   Temp 97.5 F (36.4 C) (Oral)   Ht '5\' 2"'  (1.575 m)   Wt 118 lb (53.5 kg)   BMI 21.58 kg/m   Repeat blood pressure right arm sitting with a manual cuff 154/80 X-rays of right knee pending    Assessment & Plan:  1. Pure hypercholesterolemia -Continue with atorvastatin and with as aggressive therapeutic lifestyle changes as possible pending results of lab work - CBC with Differential/Platelet - Lipid panel  2. Essential hypertension, benign -The blood pressure is slightly elevated today on 2 different occasions. The patient will check some readings from home and bring these readings by for review in 3-4 weeks - BMP8+EGFR - CBC with Differential/Platelet - Hepatic function panel  3. Vitamin D deficiency -Continue vitamin D replacement pending results of lab work if she is not taking any vitamin D she  will take D3 1000 units once daily. - CBC with Differential/Platelet - VITAMIN D 25 Hydroxy (Vit-D Deficiency, Fractures) - DG [facility] DEXA; Future  4. Hypothyroidism, unspecified type -Continue with current thyroid replacement pending results of lab work - CBC with Differential/Platelet - Thyroid Panel With TSH  5. Thoracic aortic atherosclerosis ([place]) -Continue with aggressive therapeutic lifestyle changes and atorvastatin - CBC with Differential/Platelet - Lipid panel  6. Screening for osteoporosis - DG [facility] DEXA; Future  7. Postmenopausal - DG [facility] DEXA; Future  8. DDD (degenerative disc disease), cervical -The patient has no complaints with her neck today. She did work in a [employer] in her employment years. She has a lot of arthritic changes throughout her body including her knees her neck and both hands.  9. Chronic arthralgias of knees and hips -Take extra strength Tylenol as needed and only an occasional ibuprofen and always take this after eating.  10. Malignant neoplasm of colon, unspecified part of colon ([place]) -No changes in bowel habits. No blood noted. She will return an FOBT for continued monitoring.  11. Simple chronic bronchitis (HCC) -This is stable  12. Primary osteoarthritis  of both hands -Her hands continue to bother her and we've encouraged her to take extra strength Tylenol and as needed ibuprofen but rarely.  13. Right knee pain, unspecified chronicity - DG Knee 1-2 Views Right; Future - Ambulatory referral to Orthopedic Surgery  14. Heart irregularity -The patient sees a cardiologist regularly and has a history of PACs.  Patient Instructions                       Medicare Annual Wellness Visit  [facility] and the medical providers at [facility] strive to bring you the best medical care.  In doing so we not only want to address your current medical conditions and concerns but also to detect new conditions early and prevent  illness, disease and health-related problems.    Medicare offers a yearly Wellness Visit which allows our clinical staff to assess your need for preventative services including immunizations, lifestyle education, counseling to decrease risk of preventable diseases and screening for fall risk and other medical concerns.    This visit is provided free of charge (no copay) for all Medicare recipients. The clinical pharmacists at [facility] have begun to conduct these Wellness Visits which will also include a thorough review of all your medications.    As you primary medical provider recommend that you make an appointment for your Annual Wellness Visit if you have not done so already this year.  You may set up this appointment before you leave today or you may call back ([phone]) and schedule an appointment.  Please make sure when you call that you mention that you are scheduling your Annual Wellness Visit with the clinical pharmacist so that the appointment may be made for the proper length of time.    Continue current medications. Continue good therapeutic lifestyle changes which include good diet and exercise. Fall precautions discussed with patient. If an FOBT was given today- please return it to our front desk. If you are over 16 years old - you may need Prevnar 84 or the adult Pneumonia vaccine.  **Flu shots are available--- please call and schedule a FLU-CLINIC appointment**  After your visit with [place] today you will receive a survey in the mail or online from [employer] regarding your care with [place]. Please take a moment to fill this out. Your feedback is very important to [place] as you can help [place] better understand your patient needs as well as improve your experience and satisfaction. WE CARE ABOUT YOU!!!   Stay with current medications Take extra strength Tylenol as needed for pain Bring blood pressure readings from home in 3-4 weeks to review and have nurse check blood  pressure here. We will get x-rays of your right knee and arrange for you to have an appointment with orthopedic specialist that comes to our office in the next 2-3 weeks. He may consider doing an injection in the knee to help give you some relief with the arthritis.  Arrie Senate MD

## 2017-04-03 NOTE — Patient Instructions (Addendum)
Medicare Annual Wellness Visit  [facility] and the medical providers at [facility] strive to bring you the best medical care.  In doing so we not only want to address your current medical conditions and concerns but also to detect new conditions early and prevent illness, disease and health-related problems.    Medicare offers a yearly Wellness Visit which allows our clinical staff to assess your need for preventative services including immunizations, lifestyle education, counseling to decrease risk of preventable diseases and screening for fall risk and other medical concerns.    This visit is provided free of charge (no copay) for all Medicare recipients. The clinical pharmacists at [facility] have begun to conduct these Wellness Visits which will also include a thorough review of all your medications.    As you primary medical provider recommend that you make an appointment for your Annual Wellness Visit if you have not done so already this year.  You may set up this appointment before you leave today or you may call back ([phone]) and schedule an appointment.  Please make sure when you call that you mention that you are scheduling your Annual Wellness Visit with the clinical pharmacist so that the appointment may be made for the proper length of time.    Continue current medications. Continue good therapeutic lifestyle changes which include good diet and exercise. Fall precautions discussed with patient. If an FOBT was given today- please return it to our front desk. If you are over 66 years old - you may need Prevnar 16 or the adult Pneumonia vaccine.  **Flu shots are available--- please call and schedule a FLU-CLINIC appointment**  After your visit with [place] today you will receive a survey in the mail or online from [employer] regarding your care with [place]. Please take a moment to fill this out. Your feedback is very  important to [place] as you can help [place] better understand your patient needs as well as improve your experience and satisfaction. WE CARE ABOUT YOU!!!   Stay with current medications Take extra strength Tylenol as needed for pain Bring blood pressure readings from home in 3-4 weeks to review and have nurse check blood pressure here. We will get x-rays of your right knee and arrange for you to have an appointment with orthopedic specialist that comes to our office in the next 2-3 weeks. He may consider doing an injection in the knee to help give you some relief with the arthritis.

## 2017-04-04 LAB — BMP8+EGFR
BUN / CREAT RATIO: 22 (ref 12–28)
BUN: 24 mg/dL (ref 10–36)
CHLORIDE: 96 mmol/L (ref 96–106)
CO2: 22 mmol/L (ref 18–29)
Calcium: 9.9 mg/dL (ref 8.7–10.3)
Creatinine, Ser: 1.07 mg/dL — ABNORMAL HIGH (ref 0.57–1.00)
GFR calc Af Amer: 53 mL/min/{1.73_m2} — ABNORMAL LOW (ref >59)
GFR calc non Af Amer: 46 mL/min/{1.73_m2} — ABNORMAL LOW (ref >59)
GLUCOSE: 90 mg/dL (ref 65–99)
POTASSIUM: 4.6 mmol/L (ref 3.5–5.2)
SODIUM: 137 mmol/L (ref 134–144)

## 2017-04-04 LAB — CBC WITH DIFFERENTIAL/PLATELET
BASOS ABS: 0 10*3/uL (ref 0.0–0.2)
Basos: 0 %
EOS (ABSOLUTE): 0.1 10*3/uL (ref 0.0–0.4)
Eos: 2 %
Hematocrit: 37.8 % (ref 34.0–46.6)
Hemoglobin: 12.2 g/dL (ref 11.1–15.9)
Immature Grans (Abs): 0 10*3/uL (ref 0.0–0.1)
Immature Granulocytes: 0 %
LYMPHS ABS: 1.2 10*3/uL (ref 0.7–3.1)
Lymphs: 25 %
MCH: 30.1 pg (ref 26.6–33.0)
MCHC: 32.3 g/dL (ref 31.5–35.7)
MCV: 93 fL (ref 79–97)
MONOCYTES: 15 %
MONOS ABS: 0.7 10*3/uL (ref 0.1–0.9)
NEUTROS ABS: 2.8 10*3/uL (ref 1.4–7.0)
Neutrophils: 58 %
PLATELETS: 222 10*3/uL (ref 150–379)
RBC: 4.05 x10E6/uL (ref 3.77–5.28)
RDW: 14.1 % (ref 12.3–15.4)
WBC: 4.9 10*3/uL (ref 3.4–10.8)

## 2017-04-04 LAB — LIPID PANEL
CHOLESTEROL TOTAL: 179 mg/dL (ref 100–199)
Chol/HDL Ratio: 2.3 ratio (ref 0.0–4.4)
HDL: 79 mg/dL (ref >39)
LDL Calculated: 84 mg/dL (ref 0–99)
TRIGLYCERIDES: 78 mg/dL (ref 0–149)
VLDL Cholesterol Cal: 16 mg/dL (ref 5–40)

## 2017-04-04 LAB — THYROID PANEL WITH TSH
Free Thyroxine Index: 2.6 (ref 1.2–4.9)
T3 UPTAKE RATIO: 30 % (ref 24–39)
T4 TOTAL: 8.8 ug/dL (ref 4.5–12.0)
TSH: 2.79 u[IU]/mL (ref 0.450–4.500)

## 2017-04-04 LAB — HEPATIC FUNCTION PANEL
ALK PHOS: 68 IU/L (ref 39–117)
ALT: 12 IU/L (ref 0–32)
AST: 21 IU/L (ref 0–40)
Albumin: 4.5 g/dL (ref 3.2–4.6)
BILIRUBIN, DIRECT: 0.13 mg/dL (ref 0.00–0.40)
Bilirubin Total: 0.4 mg/dL (ref 0.0–1.2)
TOTAL PROTEIN: 7.4 g/dL (ref 6.0–8.5)

## 2017-04-04 LAB — VITAMIN D 25 HYDROXY (VIT D DEFICIENCY, FRACTURES): VIT D 25 HYDROXY: 66.9 ng/mL (ref 30.0–100.0)

## 2017-04-05 ENCOUNTER — Other Ambulatory Visit: Payer: Medicare Other

## 2017-04-05 DIAGNOSIS — Z1212 Encounter for screening for malignant neoplasm of rectum: Secondary | ICD-10-CM

## 2017-04-08 LAB — FECAL OCCULT BLOOD, IMMUNOCHEMICAL: Fecal Occult Bld: NEGATIVE

## 2017-04-18 ENCOUNTER — Other Ambulatory Visit: Payer: Self-pay | Admitting: Family Medicine

## 2017-04-19 DIAGNOSIS — M1711 Unilateral primary osteoarthritis, right knee: Secondary | ICD-10-CM | POA: Diagnosis not present

## 2017-04-24 ENCOUNTER — Encounter: Payer: Self-pay | Admitting: *Deleted

## 2017-05-03 ENCOUNTER — Other Ambulatory Visit: Payer: Self-pay | Admitting: Cardiology

## 2017-05-14 ENCOUNTER — Other Ambulatory Visit: Payer: Self-pay | Admitting: Family Medicine

## 2017-06-04 ENCOUNTER — Telehealth: Payer: Self-pay | Admitting: Family Medicine

## 2017-06-04 NOTE — Telephone Encounter (Signed)
appt made for FRIday with [facility] - daughter aware that things change for the worse - she will have to see someone other than [facility] sooner. They will call if things change

## 2017-06-07 ENCOUNTER — Other Ambulatory Visit: Payer: Self-pay | Admitting: Family Medicine

## 2017-06-08 ENCOUNTER — Encounter: Payer: Self-pay | Admitting: Family Medicine

## 2017-06-08 ENCOUNTER — Ambulatory Visit (INDEPENDENT_AMBULATORY_CARE_PROVIDER_SITE_OTHER): Payer: Medicare Other | Admitting: Family Medicine

## 2017-06-08 VITALS — BP 150/78 | HR 76 | Temp 97.0°F | Ht 62.0 in | Wt 113.0 lb

## 2017-06-08 DIAGNOSIS — R5383 Other fatigue: Secondary | ICD-10-CM

## 2017-06-08 DIAGNOSIS — R531 Weakness: Secondary | ICD-10-CM

## 2017-06-08 DIAGNOSIS — I499 Cardiac arrhythmia, unspecified: Secondary | ICD-10-CM

## 2017-06-08 DIAGNOSIS — G8929 Other chronic pain: Secondary | ICD-10-CM | POA: Diagnosis not present

## 2017-06-08 DIAGNOSIS — M25561 Pain in right knee: Secondary | ICD-10-CM | POA: Diagnosis not present

## 2017-06-08 DIAGNOSIS — R42 Dizziness and giddiness: Secondary | ICD-10-CM

## 2017-06-08 LAB — URINALYSIS, COMPLETE
BILIRUBIN UA: NEGATIVE
GLUCOSE, UA: NEGATIVE
Ketones, UA: NEGATIVE
Nitrite, UA: POSITIVE — AB
PH UA: 6.5 (ref 5.0–7.5)
SPEC GRAV UA: 1.015 (ref 1.005–1.030)
Urobilinogen, Ur: 0.2 mg/dL (ref 0.2–1.0)

## 2017-06-08 LAB — MICROSCOPIC EXAMINATION
RBC, UA: >30 /hpf — AB (ref 0–?)
WBC, UA: >30 /hpf — AB (ref 0–?)

## 2017-06-08 NOTE — Progress Notes (Signed)
Subjective:    Patient ID: Brianna [name], female    DOB: 12/30/1924, 83 y.o.   MRN: [id number]  HPI Patient here today for check up on dizzy and weak spells she has been having. Her daughter accompanies her today and states that pt has had 3 episodes of dizziness, fatigue and weakness - these last about 3 days each time and her color seems different. She also wants to follow up on on-going right knee pain.The patient has had 2 or 3 episodes of weak spells. The daughter has observed some of these. They last for several days and she has low energy. She feels dizzy at times. Her color is pale at times. She is especially lightheaded with moving. She has an upcoming appointment with Dr. Domenic Polite and eaten in September and we will see if we can get this appointment moved up sooner. The patient denies any chest pain or shortness of breath is just weakness. The blood pressures at home have been running fairly well at 127/64. We got higher readings in the office today. We will not make any changes in her medicine. She denies any nausea vomiting diarrhea or blood in the stool. She is passing her water well. She denies any significant shortness of breath or chest pain. She doesn't drink a lot of fluids.     Patient Active Problem List   Diagnosis Date Noted  . Enlarged heart 04/08/2015  . Thoracic aortic atherosclerosis ([place]) 04/08/2015  . Osteoporosis, post-menopausal 11/25/2014  . Bilateral hand pain 04/13/2014  . Vitamin D deficiency 08/18/2013  . Colon cancer ([place]) 08/18/2013  . Mitral valve disease 05/27/2013  . Hyperlipidemia 03/25/2013  . Expected blood loss anemia 03/05/2013  . S/P right THA, AA 03/04/2013  . Essential hypertension, benign 02/16/2013  . COPD (chronic obstructive pulmonary disease) ([place]) 02/16/2013  . Arthritis 10/12/2011   Outpatient Encounter Prescriptions as of 06/08/2017  Medication Sig  . acetaminophen (TYLENOL) 500 MG tablet Take 500 mg by mouth every 6 (six) hours  as needed. Pain    . aspirin 81 MG tablet Take 81 mg by mouth 3 (three) times a week.  Marland Kitchen atorvastatin (LIPITOR) 10 MG tablet TAKE 1/2 TABLET AT BEDTIME  . beta carotene w/minerals (OCUVITE) tablet Take 1 tablet by mouth daily.    . Calcium Citrate-Vitamin D (CITRACAL + D PO) Take 2 tablets by mouth daily.   . Cholecalciferol (VITAMIN D3) 2000 UNITS TABS Take 1 tablet by mouth daily.   . hydrochlorothiazide (MICROZIDE) 12.5 MG capsule TAKE ONE (1) CAPSULE EACH DAY  . levothyroxine (SYNTHROID, LEVOTHROID) 25 MCG tablet TAKE 2 TABLETS DAILY MONDAY-FRIDAY TAKE 1 TABLET DAILY SATURDAY & SUNDAY  . losartan (COZAAR) 100 MG tablet TAKE ONE (1) TABLET EACH DAY  . Omega-3 Fatty Acids (FISH OIL PO) Take 1 tablet by mouth daily.   No facility-administered encounter medications on file as of 06/08/2017.       Review of Systems  Constitutional: Positive for fatigue.  HENT: Negative.   Eyes: Negative.   Respiratory: Negative.   Cardiovascular: Negative.   Gastrointestinal: Negative.   Endocrine: Negative.   Genitourinary: Negative.   Musculoskeletal: Positive for arthralgias (some left shoulder pain and right knee pain).  Skin: Negative.   Allergic/Immunologic: Negative.   Neurological: Positive for dizziness (comes in episodes ) and weakness.  Hematological: Negative.   Psychiatric/Behavioral: Negative.        Objective:   Physical Exam  Constitutional: She is oriented to person, place, and time. She appears  well-developed and well-nourished. No distress.  The patient is pleasant and alert and looks younger than her stated age of 89 years.  HENT:  Head: Normocephalic and atraumatic.  Right Ear: External ear normal.  Left Ear: External ear normal.  Nose: Nose normal.  Mouth/Throat: Oropharynx is clear and moist.  Eyes: Pupils are equal, round, and reactive to light. Conjunctivae and EOM are normal. Right eye exhibits no discharge. Left eye exhibits no discharge. No scleral icterus.    Neck: Normal range of motion. Neck supple. No thyromegaly present.  No bruits or thyromegaly  Cardiovascular: Normal rate, normal heart sounds and intact distal pulses.   No murmur heard. Heart is irregular irregular at about 72/m. The pulses were difficult to palpate but present.  Pulmonary/Chest: Effort normal and breath sounds normal. No respiratory distress. She has no wheezes. She has no rales.  Clear anteriorly and posteriorly  Abdominal: Soft. Bowel sounds are normal. She exhibits no mass. There is no tenderness. There is no rebound and no guarding.  No abdominal tenderness masses or organ enlargement  Musculoskeletal: Normal range of motion. She exhibits no edema.  Lymphadenopathy:    She has no cervical adenopathy.  Neurological: She is alert and oriented to person, place, and time.  Skin: Skin is warm and dry. No rash noted.  Psychiatric: She has a normal mood and affect. Her behavior is normal. Judgment and thought content normal.  Nursing note and vitals reviewed.  BP (!) 155/80 (BP Location: Left Arm)   Pulse 76   Temp (!) 97 F (36.1 C) (Oral)   Ht '5\' 2"'  (1.575 m)   Wt 113 lb (51.3 kg)   BMI 20.67 kg/m    EKG with results pending=== the EKG had PVCs and PACs. The patient and her daughter were given copies of the EKG results from this office to take to the cardiologist.     Assessment & Plan:  1. Chronic pain of right knee -Orthopedist recheck patient  2. General weakness -Get EKG today and schedule a visit with cardiology sooner than later - BMP8+EGFR - Thyroid Panel With TSH - CBC with Differential/Platelet - Vitamin B12  3. Fatigue, unspecified type -Recheck labs as planned - BMP8+EGFR - Thyroid Panel With TSH - CBC with Differential/Platelet - Vitamin B12 - EKG 12-Lead  4. Dizziness -This this could certainly be cardiac related - BMP8+EGFR - Thyroid Panel With TSH - CBC with Differential/Platelet - Vitamin B12 - EKG 12-Lead  5. Irregular  heartbeat -Irregular irregular today at 72/m  Patient Instructions  We will try to get the appointment with the cardiologist moved up to a sooner date because of the spells this patient has been having. We will arrange for her to have an appointment with the orthopedic surgeon that comes to this office at his next visit. The patient should drink plenty of fluids and stay well hydrated She should be careful not to put yourself at risk for falling   Arrie Senate MD

## 2017-06-08 NOTE — Patient Instructions (Addendum)
We will try to get the appointment with the cardiologist moved up to a sooner date because of the spells this patient has been having. We will arrange for her to have an appointment with the orthopedic surgeon that comes to this office at his next visit. The patient should drink plenty of fluids and stay well hydrated She should be careful not to put yourself at risk for falling Get blood pressure readings at home and check pulse rate and regular 30 when possible especially during the spells that she is having.

## 2017-06-09 LAB — CBC WITH DIFFERENTIAL/PLATELET
Basophils Absolute: 0 10*3/uL (ref 0.0–0.2)
Basos: 0 %
EOS (ABSOLUTE): 0.1 10*3/uL (ref 0.0–0.4)
EOS: 2 %
HEMATOCRIT: 35.7 % (ref 34.0–46.6)
HEMOGLOBIN: 11.8 g/dL (ref 11.1–15.9)
Immature Grans (Abs): 0 10*3/uL (ref 0.0–0.1)
Immature Granulocytes: 0 %
LYMPHS ABS: 1.4 10*3/uL (ref 0.7–3.1)
Lymphs: 26 %
MCH: 30.3 pg (ref 26.6–33.0)
MCHC: 33.1 g/dL (ref 31.5–35.7)
MCV: 92 fL (ref 79–97)
MONOCYTES: 17 %
Monocytes Absolute: 0.9 10*3/uL (ref 0.1–0.9)
NEUTROS ABS: 2.9 10*3/uL (ref 1.4–7.0)
Neutrophils: 55 %
Platelets: 243 10*3/uL (ref 150–379)
RBC: 3.9 x10E6/uL (ref 3.77–5.28)
RDW: 14.1 % (ref 12.3–15.4)
WBC: 5.2 10*3/uL (ref 3.4–10.8)

## 2017-06-09 LAB — BMP8+EGFR
BUN / CREAT RATIO: 29 — AB (ref 12–28)
BUN: 27 mg/dL (ref 10–36)
CALCIUM: 10 mg/dL (ref 8.7–10.3)
CHLORIDE: 95 mmol/L — AB (ref 96–106)
CO2: 25 mmol/L (ref 20–29)
Creatinine, Ser: 0.92 mg/dL (ref 0.57–1.00)
GFR calc non Af Amer: 55 mL/min/{1.73_m2} — ABNORMAL LOW (ref >59)
GFR, EST AFRICAN AMERICAN: 63 mL/min/{1.73_m2} (ref >59)
Glucose: 88 mg/dL (ref 65–99)
POTASSIUM: 4.8 mmol/L (ref 3.5–5.2)
Sodium: 134 mmol/L (ref 134–144)

## 2017-06-09 LAB — THYROID PANEL WITH TSH
Free Thyroxine Index: 2.7 (ref 1.2–4.9)
T3 Uptake Ratio: 30 % (ref 24–39)
T4, Total: 8.9 ug/dL (ref 4.5–12.0)
TSH: 1.47 u[IU]/mL (ref 0.450–4.500)

## 2017-06-09 LAB — VITAMIN B12: VITAMIN B 12: 437 pg/mL (ref 232–1245)

## 2017-06-11 ENCOUNTER — Other Ambulatory Visit: Payer: Self-pay | Admitting: *Deleted

## 2017-06-11 MED ORDER — CEFDINIR 300 MG PO CAPS: 300.0000 mg | ORAL_CAPSULE | Freq: Two times a day (BID) | ORAL | 0 refills | Status: DC

## 2017-06-13 ENCOUNTER — Telehealth: Payer: Self-pay | Admitting: Family Medicine

## 2017-06-13 ENCOUNTER — Telehealth: Payer: Self-pay

## 2017-06-13 LAB — URINE CULTURE

## 2017-06-13 NOTE — Telephone Encounter (Signed)
-----   Message from Merlene Laughter, LPN sent at 1/55/2080  2:00 PM EDT -----   ----- Message ----- From: Satira Sark, MD Sent: 06/13/2017  12:58 PM To: Merlene Laughter, LPN, Cleda Clarks Bullins, LPN  I will forward to our staff and see if we can get her an office visit scheduled with an APP in the near future. It sounds like she may need a cardiac monitor.  ----- Message ----- From: Zannie Cove, LPN Sent: 12/22/3610  11:32 AM To: Satira Sark, MD, Chipper Herb, MD  Dr Domenic Polite,  Dr Laurance Flatten seen this pt on 06/08/17 and she has had some recent episodes of dizziness, weakness and she has some ekg changes. Can you review the record and see if it would be appropriate for you to see her sooner. Thanks for your help! Georgina Pillion, LPN

## 2017-06-13 NOTE — Telephone Encounter (Signed)
Per daughter, patient does not want to be seen any sooner than her appointment with Dr. Domenic Polite in September.

## 2017-06-13 NOTE — Telephone Encounter (Signed)
Pt called  - they will se Brianna [name] the first of [place].

## 2017-07-04 ENCOUNTER — Ambulatory Visit: Payer: Medicare Other | Admitting: Cardiology

## 2017-07-09 ENCOUNTER — Encounter: Payer: Self-pay | Admitting: Cardiology

## 2017-07-09 ENCOUNTER — Ambulatory Visit (INDEPENDENT_AMBULATORY_CARE_PROVIDER_SITE_OTHER): Payer: Medicare Other | Admitting: Cardiology

## 2017-07-09 VITALS — BP 146/78 | HR 70 | Ht 62.0 in | Wt 114.4 lb

## 2017-07-09 DIAGNOSIS — R531 Weakness: Secondary | ICD-10-CM | POA: Diagnosis not present

## 2017-07-09 DIAGNOSIS — I059 Rheumatic mitral valve disease, unspecified: Secondary | ICD-10-CM

## 2017-07-09 NOTE — Progress Notes (Signed)
Cardiology Office Note  Date: 07/09/2017   ID: Brianna [name], DOB 1925/09/23, MRN [id number]  PCP: Chipper Herb, MD  Primary Cardiologist: Rozann Lesches, MD   Chief Complaint  Patient presents with  . Cardiac follow-up    History of Present Illness: Brianna [name] is a 83 y.o. female last seen in February. She presents for a routine follow-up visit, seen also recently by Dr. Laurance [name] in August and reporting some episodes of dizziness and weakness. She is here with her daughter today. She tells me that since March of this year she has had intermittent episodes of weakness, not feeling well, no specific chest pain or palpitations, no definite syncope. On one occasion she did have a UTI, but this was not consistently noted with all symptoms.  I reviewed her recent ECG as discussed below.  At this time she feels back to baseline. We have discussed the possibility of obtaining an outpatient cardiac monitor if symptoms recur to exclude any paroxysmal arrhythmias that could be contributing.  Past Medical History:  Diagnosis Date  . Bilateral inguinal hernia (BIH)   . Carpal tunnel syndrome on right   . Colon polyp   . COPD (chronic obstructive pulmonary disease) ([place])   . Essential hypertension, benign   . Fibrocystic breast   . Frequency of urination   . History of skin cancer   . Hypothyroidism   . Lumbosacral spondylosis without myelopathy   . Mixed hyperlipidemia   . Myxomatous mitral valve    Mild prolapse with eccentric, moderate to severe regurgitation  . Obturator hernia, right   . Osteoporosis   . PONV (postoperative nausea and vomiting)   . 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 Symptomatic menopausal or female climacteric states   . Ventral hernia     Past Surgical History:  Procedure Laterality Date  . APPENDECTOMY    . CATARACT EXTRACTION W/ INTRAOCULAR LENS  IMPLANT, BILATERAL    . CESAREAN SECTION    . COLON SURGERY  2004   8'04-open LAR for  rectosigmoid cancer T1N0 within polyp  . INGUINAL HERNIA REPAIR  11/21/2011   Procedure: LAPAROSCOPIC BILATERAL INGUINAL HERNIA REPAIR;  Surgeon: Adin Hector, MD;  Location: [facility];  Service: General;  Laterality: N/A;  . LAPAROSCOPY  11/21/2011   R obturator hernia repair  . TONSILLECTOMY    . TOTAL HIP ARTHROPLASTY Right 03/04/2013   Procedure: RIGHT TOTAL HIP ARTHROPLASTY ANTERIOR APPROACH;  Surgeon: Mauri Pole, MD;  Location: [facility];  Service: Orthopedics;  Laterality: Right;  . VENTRAL HERNIA REPAIR  11/21/2011   Procedure: LAPAROSCOPIC VENTRAL HERNIA;  Surgeon: Adin Hector, MD;  Location: [facility];  Service: General;  Laterality: N/A;  laparoscopic bilateral inguinal hernia repair with mesh and ventral hernia repair with mesh    Current Outpatient Prescriptions  Medication Sig Dispense Refill  . aspirin 81 MG tablet Take 81 mg by mouth 3 (three) times a week.    Marland Kitchen atorvastatin (LIPITOR) 10 MG tablet TAKE 1/2 TABLET AT BEDTIME 45 tablet 0  . beta carotene w/minerals (OCUVITE) tablet Take 1 tablet by mouth daily.      . Calcium Citrate-Vitamin D (CITRACAL + D PO) Take 2 tablets by mouth daily.     . Cholecalciferol (VITAMIN D3) 2000 UNITS TABS Take 1 tablet by mouth daily.     . hydrochlorothiazide (MICROZIDE) 12.5 MG capsule TAKE ONE (1) CAPSULE EACH DAY 90 capsule 0  . levothyroxine (SYNTHROID, LEVOTHROID)  25 MCG tablet TAKE 2 TABLETS DAILY MONDAY-FRIDAY TAKE 1 TABLET DAILY SATURDAY & SUNDAY 154 tablet 1  . losartan (COZAAR) 100 MG tablet TAKE ONE (1) TABLET EACH DAY 90 tablet 1  . Omega-3 Fatty Acids (FISH OIL PO) Take 1 tablet by mouth daily.     No current facility-administered medications for this visit.    Allergies:  Alendronate sodium; Lodine [etodolac]; and Risedronate sodium   Social History: The patient  reports that she has never smoked. She has never used smokeless tobacco. She reports that she does not drink alcohol or use drugs.   ROS:  Please see the history of  present illness. Otherwise, complete review of systems is positive for hearing loss.  All other systems are reviewed and negative.   Physical Exam: VS:  BP (!) 146/78   Pulse 70   Ht 5\' 2"  (1.575 m)   Wt 114 lb 6.4 oz (51.9 kg)   SpO2 98%   BMI 20.92 kg/m , BMI Body mass index is 20.92 kg/m.  Wt Readings from Last 3 Encounters:  07/09/17 114 lb 6.4 oz (51.9 kg)  06/08/17 113 lb (51.3 kg)  04/03/17 118 lb (53.5 kg)    Elderly woman, appears comfortable at rest. HEENT: Conjunctiva and lids normal, oropharynx clear.  Neck: Supple, no elevated JVP or carotid bruits, no thyromegaly.  Lungs: Clear to auscultation, nonlabored breathing at rest.  Cardiac: Regular rate and rhythm with ectopy, no S3, 5-1/7 systolic murmur in mid systole mainly in the lateral thorax and into the back, no pericardial rub.  Abdomen: Soft, nontender, bowel sounds present.  Extremities: No pitting edema, distal pulses 2+.  ECG: I personally reviewed the tracing from 06/08/2017 which showed sinus rhythm with PVCs and probable nonconducted PACs.  Recent Labwork: 04/03/2017: ALT 12; AST 21 06/08/2017: BUN 27; Creatinine, Ser 0.92; Hemoglobin 11.8; Platelets 243; Potassium 4.8; Sodium 134; TSH 1.470     Component Value Date/Time   CHOL 179 04/03/2017 1051   TRIG 78 04/03/2017 1051   TRIG 62 08/24/2014 1230   HDL 79 04/03/2017 1051   HDL 72 08/24/2014 1230   CHOLHDL 2.3 04/03/2017 1051   CHOLHDL 2.7 03/25/2013 1255   VLDL 12 03/25/2013 1255   LDLCALC 84 04/03/2017 1051   LDLCALC 79 08/21/2013 0838    Other Studies Reviewed Today:  Echocardiogram 02/20/2013: Study Conclusions  - Left ventricle: The cavity size was normal. Wall thickness was increased in a pattern of mild LVH. There was mild concentric hypertrophy. Systolic function was normal. The estimated ejection fraction was in the range of 60% to 65%. Wall motion was normal; there were no regional wall motion abnormalities. Doppler  parameters are consistent with abnormal left ventricular relaxation (grade 1 diastolic dysfunction). - Aortic valve: Trileaflet; mildly thickened, mildly calcified leaflets. Mild regurgitation. Mean gradient: 31mm Hg (S). Valve area: 2.4cm^2(VTI). - Mitral valve: Mildly to moderately thickened leaflets - appear myxomatous. Mild prolapse, involving the anterior leaflet and the posterior leaflet. Moderate to severe regurgitation directed eccentrically. Unable to quantify more objectively based on limited interrogation. - Left atrium: The atrium was moderately dilated. - Right atrium: The atrium was mildly dilated. Central venous pressure: 66mm Hg (est). - Tricuspid valve: Mild regurgitation. - Pulmonic valve: Mild regurgitation directed eccentrically. - Pulmonary arteries: PA peak pressure: 50mm Hg (S). PA pressure: 82mm Hg (ED). - Pericardium, extracardiac: There was no pericardial effusion.  Assessment and Plan:  1. Episodic weakness as outlined above. No definite syncope or palpitations. At this time  she is back to baseline and we will continue with observation. With recurrent symptoms outpatient monitoring can be pursued.  2. Mitral valve prolapse with moderate to severe mitral regurgitation, followed conservatively at this time. She has preferred to hold off on further imaging or evaluation.  Current medicines were reviewed with the patient today.  Disposition: Follow-up in 3 months.  Signed, Satira Sark, MD, [facility] 07/09/2017 2:20 PM    [facility] [facility] [place], [place], [place] [address] Phone: [phone]; Fax: [phone]

## 2017-07-09 NOTE — Patient Instructions (Signed)
Your physician recommends that you schedule a follow-up appointment in: 3 MONTHS WITH DR. MCDOWELL  Your physician recommends that you continue on your current medications as directed. Please refer to the Current Medication list given to you today.  Thank you for choosing [facility]!!    

## 2017-07-20 DIAGNOSIS — I10 Essential (primary) hypertension: Secondary | ICD-10-CM | POA: Diagnosis not present

## 2017-07-20 DIAGNOSIS — S5011XA Contusion of right forearm, initial encounter: Secondary | ICD-10-CM | POA: Diagnosis not present

## 2017-07-20 DIAGNOSIS — E7800 Pure hypercholesterolemia, unspecified: Secondary | ICD-10-CM | POA: Diagnosis not present

## 2017-07-20 DIAGNOSIS — S0031XA Abrasion of nose, initial encounter: Secondary | ICD-10-CM | POA: Diagnosis not present

## 2017-07-20 DIAGNOSIS — S0181XA Laceration without foreign body of other part of head, initial encounter: Secondary | ICD-10-CM | POA: Diagnosis not present

## 2017-07-20 DIAGNOSIS — S51811A Laceration without foreign body of right forearm, initial encounter: Secondary | ICD-10-CM | POA: Diagnosis not present

## 2017-07-20 DIAGNOSIS — S0011XA Contusion of right eyelid and periocular area, initial encounter: Secondary | ICD-10-CM | POA: Diagnosis not present

## 2017-07-20 DIAGNOSIS — S61011A Laceration without foreign body of right thumb without damage to nail, initial encounter: Secondary | ICD-10-CM | POA: Diagnosis not present

## 2017-07-20 DIAGNOSIS — W172XXA Fall into hole, initial encounter: Secondary | ICD-10-CM | POA: Diagnosis not present

## 2017-07-20 DIAGNOSIS — Z7982 Long term (current) use of aspirin: Secondary | ICD-10-CM | POA: Diagnosis not present

## 2017-07-20 DIAGNOSIS — S0083XA Contusion of other part of head, initial encounter: Secondary | ICD-10-CM | POA: Diagnosis not present

## 2017-07-20 DIAGNOSIS — Z79899 Other long term (current) drug therapy: Secondary | ICD-10-CM | POA: Diagnosis not present

## 2017-07-24 ENCOUNTER — Encounter: Payer: Self-pay | Admitting: Family Medicine

## 2017-07-24 ENCOUNTER — Ambulatory Visit (INDEPENDENT_AMBULATORY_CARE_PROVIDER_SITE_OTHER): Payer: Medicare Other | Admitting: Family Medicine

## 2017-07-24 VITALS — BP 146/80 | HR 83 | Temp 97.3°F | Ht 62.0 in | Wt 114.0 lb

## 2017-07-24 DIAGNOSIS — S51811D Laceration without foreign body of right forearm, subsequent encounter: Secondary | ICD-10-CM

## 2017-07-24 DIAGNOSIS — S61511D Laceration without foreign body of right wrist, subsequent encounter: Secondary | ICD-10-CM

## 2017-07-24 DIAGNOSIS — T148XXA Other injury of unspecified body region, initial encounter: Secondary | ICD-10-CM | POA: Diagnosis not present

## 2017-07-24 DIAGNOSIS — Z23 Encounter for immunization: Secondary | ICD-10-CM

## 2017-07-24 DIAGNOSIS — S0083XD Contusion of other part of head, subsequent encounter: Secondary | ICD-10-CM | POA: Diagnosis not present

## 2017-07-24 NOTE — Patient Instructions (Signed)
Continue with saline dressings to right forearm skin tear wet to dry and change the dressings twice daily. Continue dressing to right wrist and leave Steri-Strips in place until they come off on their own Continue and complete antibiotic Return to the office in 10-14 days for recheck or sooner if any signs of infection develop

## 2017-07-24 NOTE — Addendum Note (Signed)
Addended by: Zannie Cove on: 07/24/2017 12:12 PM   Modules accepted: Orders

## 2017-07-24 NOTE — Progress Notes (Signed)
Subjective:    Patient ID: Brianna [name], female    DOB: [birth year], 83 y.o.   MRN: [id number]  HPI Patient is here today for a ER follow up. She went to [facility] for a fall on 07/20/2017. Patient fell  Patient has facial bruising and a skin tear on her right forearm.According to the ER results of her no broken bones. Her vital signs are stable. Her systolic blood pressure is slightly elevated. The patient was apparently sweeping on the sidewalk stepped on the edge of the sidewalk lost her balance and fell on her right arm and received a large hematoma to the forearm with a skin tear to the right forearm as well as facial contusion and small skin tears of the for head and right wrist area. No x-rays were done according to the daughter. We're not sure about her tetanus immunization status and we will check on this. This happened 4 days ago. The patient has not had any chest pain or shortness of breath since the fall. She has not had any trouble with her vision other than having to use a normal set of glasses. She does have eye exam coming up soon and that may have to be postponed until some of the swelling goes down. But she will need to get new glasses. She is using all glasses currently.   Review of Systems  Constitutional: Negative.   HENT: Negative.   Eyes: Negative.   Respiratory: Negative.   Cardiovascular: Negative.   Gastrointestinal: Negative.   Endocrine: Negative.   Genitourinary: Negative.   Musculoskeletal: Negative.   Skin: Positive for color change and wound.  Allergic/Immunologic: Negative.   Neurological: Negative.   Hematological: Negative.   Psychiatric/Behavioral: Negative.          Patient Active Problem List   Diagnosis Date Noted  . Enlarged heart 04/08/2015  . Thoracic aortic atherosclerosis ([place]) 04/08/2015  . Osteoporosis, post-menopausal 11/25/2014  . Bilateral hand pain 04/13/2014  . Vitamin D deficiency 08/18/2013  . Colon cancer ([place])  08/18/2013  . Mitral valve disease 05/27/2013  . Hyperlipidemia 03/25/2013  . Expected blood loss anemia 03/05/2013  . S/P right THA, AA 03/04/2013  . Essential hypertension, benign 02/16/2013  . COPD (chronic obstructive pulmonary disease) ([place]) 02/16/2013  . Arthritis 10/12/2011   Outpatient Encounter Prescriptions as of 07/24/2017  Medication Sig  . aspirin 81 MG tablet Take 81 mg by mouth 3 (three) times a week.  Marland Kitchen atorvastatin (LIPITOR) 10 MG tablet TAKE 1/2 TABLET AT BEDTIME  . beta carotene w/minerals (OCUVITE) tablet Take 1 tablet by mouth daily.    . Calcium Citrate-Vitamin D (CITRACAL + D PO) Take 2 tablets by mouth daily.   . cephALEXin (KEFLEX) 500 MG capsule Take 500 mg by mouth 3 (three) times daily.   . Cholecalciferol (VITAMIN D3) 2000 UNITS TABS Take 1 tablet by mouth daily.   . hydrochlorothiazide (MICROZIDE) 12.5 MG capsule TAKE ONE (1) CAPSULE EACH DAY  . levothyroxine (SYNTHROID, LEVOTHROID) 25 MCG tablet TAKE 2 TABLETS DAILY MONDAY-FRIDAY TAKE 1 TABLET DAILY SATURDAY & SUNDAY  . losartan (COZAAR) 100 MG tablet TAKE ONE (1) TABLET EACH DAY  . Omega-3 Fatty Acids (FISH OIL PO) Take 1 tablet by mouth daily.   No facility-administered encounter medications on file as of 07/24/2017.          Objective:   Physical Exam  Constitutional: She is oriented to person, place, and time. She appears well-developed and well-nourished. No distress.  The  patient is pleasant and alert.  HENT:  Head: Normocephalic.  Right Ear: External ear normal.  Left Ear: External ear normal.  Mouth/Throat: Oropharynx is clear and moist. No oropharyngeal exudate.  The patient has a facial contusion with bruising around both eyes and swelling over the bridge of the nose. There are small lacerations that have been glued together in the left for head and left forehead hairline.  Eyes: Pupils are equal, round, and reactive to light. Conjunctivae and EOM are normal. Right eye exhibits no discharge.  Left eye exhibits no discharge. No scleral icterus.  Neck: Normal range of motion.  Cardiovascular: Normal rate and normal heart sounds.   No murmur heard. Heart is slightly irregular at 72/m  Pulmonary/Chest: Effort normal and breath sounds normal. She has no wheezes. She has no rales.  Musculoskeletal: She exhibits edema, tenderness and deformity.  She has a skin tear of the right lateral forearm and the edges of it tear cannot be approximated. There is a hematoma of the right forearm. This was cleansed with saline and a saline wet-to-dry dressing will be applied to this and the family will continue to do this twice daily when the patient goes home and will last to recheck this in a couple of weeks. We should recheck it sooner if there is any redness or sign of infection. She should finish up with her current Keflex prescription.  Neurological: She is alert and oriented to person, place, and time.  Skin: Skin is warm. No rash noted.  There is a skin tear to the right forearm and wet-to-dry dressings of saline will be applied to this twice daily and will continue this at home. The daughter is with the patient today we'll make sure this gets done regularly. There is a laceration of the right wrist with Steri-Strips and this will be dressed daily for protection.  Psychiatric: She has a normal mood and affect. Her behavior is normal. Judgment and thought content normal.  Nursing note and vitals reviewed.   BP (!) 146/80   Pulse 83   Temp (!) 97.3 F (36.3 C) (Oral)   Ht 5\' 2"  (1.575 m)   Wt 114 lb (51.7 kg)   BMI 20.85 kg/m        Assessment & Plan:  1. Skin tear of forearm without complication, right, subsequent encounter -Saline wet-to-dry dressings twice daily -Finish antibiotic treatment   2. Hematoma -This should resolve with time and we will continue to monitor this.  3. Contusion of face, subsequent encounter -This should continue to resolve and the swelling should diminish  significantly in the interim between now and the next visit.  4. Laceration of right wrist, subsequent encounter -Keep dressing on this and change daily and let Steri-Strips come off on their own.  Patient Instructions  Continue with saline dressings to right forearm skin tear wet to dry and change the dressings twice daily. Continue dressing to right wrist and leave Steri-Strips in place until they come off on their own Continue and complete antibiotic Return to the office in 10-14 days for recheck or sooner if any signs of infection develop  Arrie Senate MD

## 2017-07-31 ENCOUNTER — Telehealth: Payer: Self-pay | Admitting: Family Medicine

## 2017-07-31 NOTE — Telephone Encounter (Signed)
Spoke with patient's daughter who was wondering if they were to continue the wet to dry dressings until patient returns on 08/07/17 or should she do nonstick dressings until then.  I advised the patient according to DWM's notes she should continue the wet to dry dressings and if she runs into any problems, she should give [place] a call.

## 2017-08-03 ENCOUNTER — Other Ambulatory Visit: Payer: Self-pay | Admitting: Cardiovascular Disease

## 2017-08-07 ENCOUNTER — Encounter: Payer: Self-pay | Admitting: Family Medicine

## 2017-08-07 ENCOUNTER — Ambulatory Visit (INDEPENDENT_AMBULATORY_CARE_PROVIDER_SITE_OTHER): Payer: Medicare Other | Admitting: Family Medicine

## 2017-08-07 VITALS — BP 149/70 | HR 84 | Temp 96.9°F | Ht 62.0 in | Wt 112.0 lb

## 2017-08-07 DIAGNOSIS — S61511D Laceration without foreign body of right wrist, subsequent encounter: Secondary | ICD-10-CM

## 2017-08-07 DIAGNOSIS — T148XXA Other injury of unspecified body region, initial encounter: Secondary | ICD-10-CM

## 2017-08-07 DIAGNOSIS — Z23 Encounter for immunization: Secondary | ICD-10-CM

## 2017-08-07 MED ORDER — MUPIROCIN 2 % EX OINT: 1.0000 "application " | TOPICAL_OINTMENT | Freq: Two times a day (BID) | CUTANEOUS | 1 refills | Status: DC

## 2017-08-07 NOTE — Progress Notes (Signed)
Subjective:    Patient ID: Brianna [name], female    DOB: December 29, 1924, 83 y.o.   MRN: [id number]  HPI Patient here today for 2 week follow up on right arm wound from a recent fall. The patient is looking better with the contusions on her face. She comes to the visit today with her daughter. Her daughter has been helping her clean it with saline a couple times a day and apply the dressing to the wound. She thinks the hematoma is smaller.    Patient Active Problem List   Diagnosis Date Noted  . Enlarged heart 04/08/2015  . Thoracic aortic atherosclerosis ([place]) 04/08/2015  . Osteoporosis, post-menopausal 11/25/2014  . Bilateral hand pain 04/13/2014  . Vitamin D deficiency 08/18/2013  . Colon cancer ([place]) 08/18/2013  . Mitral valve disease 05/27/2013  . Hyperlipidemia 03/25/2013  . Expected blood loss anemia 03/05/2013  . S/P right THA, AA 03/04/2013  . Essential hypertension, benign 02/16/2013  . COPD (chronic obstructive pulmonary disease) ([place]) 02/16/2013  . Arthritis 10/12/2011   Outpatient Encounter Prescriptions as of 08/07/2017  Medication Sig  . aspirin 81 MG tablet Take 81 mg by mouth 3 (three) times a week.  Marland Kitchen atorvastatin (LIPITOR) 10 MG tablet TAKE 1/2 TABLET AT BEDTIME  . beta carotene w/minerals (OCUVITE) tablet Take 1 tablet by mouth daily.    . Calcium Citrate-Vitamin D (CITRACAL + D PO) Take 2 tablets by mouth daily.   . Cholecalciferol (VITAMIN D3) 2000 UNITS TABS Take 1 tablet by mouth daily.   . hydrochlorothiazide (MICROZIDE) 12.5 MG capsule TAKE ONE (1) CAPSULE EACH DAY  . levothyroxine (SYNTHROID, LEVOTHROID) 25 MCG tablet TAKE 2 TABLETS DAILY MONDAY-FRIDAY TAKE 1 TABLET DAILY SATURDAY & SUNDAY  . losartan (COZAAR) 100 MG tablet TAKE ONE (1) TABLET EACH DAY  . Omega-3 Fatty Acids (FISH OIL PO) Take 1 tablet by mouth daily.  . [DISCONTINUED] cephALEXin (KEFLEX) 500 MG capsule Take 500 mg by mouth 3 (three) times daily.    No facility-administered encounter  medications on file as of 08/07/2017.       Review of Systems  Constitutional: Negative.   HENT: Negative.   Eyes: Negative.   Respiratory: Negative.   Cardiovascular: Negative.   Gastrointestinal: Negative.   Endocrine: Negative.   Genitourinary: Negative.   Musculoskeletal: Negative.   Skin: Positive for wound (right arm wound).  Allergic/Immunologic: Negative.   Neurological: Negative.   Hematological: Negative.   Psychiatric/Behavioral: Negative.        Objective:   Physical Exam  Constitutional: She is oriented to person, place, and time. She appears well-developed and well-nourished. No distress.  HENT:  Head: Normocephalic.  The facial contusions appear to be resolving  Eyes: Pupils are equal, round, and reactive to light. Conjunctivae and EOM are normal. Right eye exhibits no discharge. Left eye exhibits no discharge. No scleral icterus.  Neck: Normal range of motion.  Musculoskeletal: Normal range of motion. She exhibits no edema.  There is good range of motion of all extremities.  Neurological: She is alert and oriented to person, place, and time.  Skin: Skin is warm and dry. No rash noted. No erythema.  The hematoma on the right forearm persists but may be somewhat smaller than previously. There is no redness and minimal if any drainage around the open wound part of the hematoma. We will clean and redress it today and apply small amount of Bactroban ointment around the open wound area.  Psychiatric: She has a normal mood and  affect. Her behavior is normal. Judgment and thought content normal.  Nursing note and vitals reviewed.   BP (!) 149/70 (BP Location: Left Arm)   Pulse 84   Temp (!) 96.9 F (36.1 C) (Oral)   Ht 5\' 2"  (1.575 m)   Wt 112 lb (50.8 kg)   BMI 20.49 kg/m        Assessment & Plan:  1. Hematoma -The hematoma remains fairly large on her forearm and the open wound above the hematoma has some minimal skin breakdown. There is no redness like  infection. She will continue with the saline cleansings twice a day and apply Bactroban ointment to the wound area that is raw and open to the outside. We will continue to monitor the hematoma for getting smaller and recheck her again in a couple of weeks. -The daughter was instructed to call us to see her sooner if any redness or signs of infection develop. -If the hematoma does not resolve we could consider a surgical evaluation and evacuation of this hematoma. For now, we will do watchful waiting.  2. Laceration of right wrist, subsequent encounter -The Steri-Strips are still in place on the right lower forearm wound which appears to be well healed and we will let the Steri-Strips come off on their own as she gets her skin wet in this area.  No orders of the defined types were placed in this encounter.  Patient Instructions  Continue with saline cleansing twice daily and apply Bactroban ointment to the wound area. The laceration on the right wrist appears to be healing and the patient can get this wet and as the Steri-Strips come off it should be fine. There is no redness in this area. If any redness develops on the open wound on the right forearm, the patient should return to the office for recheck otherwise we will see her back in a couple of weeks.  Arrie Senate MD

## 2017-08-07 NOTE — Addendum Note (Signed)
Addended by: Zannie Cove on: 08/07/2017 02:23 PM   Modules accepted: Orders

## 2017-08-07 NOTE — Patient Instructions (Signed)
Continue with saline cleansing twice daily and apply Bactroban ointment to the wound area. The laceration on the right wrist appears to be healing and the patient can get this wet and as the Steri-Strips come off it should be fine. There is no redness in this area. If any redness develops on the open wound on the right forearm, the patient should return to the office for recheck otherwise we will see her back in a couple of weeks.

## 2017-08-21 ENCOUNTER — Ambulatory Visit (INDEPENDENT_AMBULATORY_CARE_PROVIDER_SITE_OTHER): Payer: Medicare Other | Admitting: Family Medicine

## 2017-08-21 ENCOUNTER — Encounter: Payer: Self-pay | Admitting: Family Medicine

## 2017-08-21 VITALS — BP 158/84 | HR 76 | Temp 97.3°F | Ht 62.0 in | Wt 110.0 lb

## 2017-08-21 DIAGNOSIS — S0083XD Contusion of other part of head, subsequent encounter: Secondary | ICD-10-CM

## 2017-08-21 DIAGNOSIS — T148XXA Other injury of unspecified body region, initial encounter: Secondary | ICD-10-CM | POA: Diagnosis not present

## 2017-08-21 DIAGNOSIS — S61511D Laceration without foreign body of right wrist, subsequent encounter: Secondary | ICD-10-CM

## 2017-08-21 DIAGNOSIS — S51811D Laceration without foreign body of right forearm, subsequent encounter: Secondary | ICD-10-CM

## 2017-08-21 NOTE — Patient Instructions (Signed)
Continue to cleanse the wound with saline as you have been doing Apply Bactroban ointment after cleansing and dressing Return to the office in a couple weeks and if the wound is greatly improved by that time you can call and cancel that appointment and come in 4 weeks instead Call sooner if any sign of infection appears to be occurring like redness or purulent drainage

## 2017-08-21 NOTE — Progress Notes (Signed)
Subjective:    Patient ID: Brianna [name], female    DOB: 1925-04-24, 83 y.o.   MRN: [id number]  HPI Patient here today for 2 week follow up on right arm wound.  Comes to the visit with her daughter.  She is doing much better and the daughter feels that the wound is healing.    Patient Active Problem List   Diagnosis Date Noted  . Enlarged heart 04/08/2015  . Thoracic aortic atherosclerosis ([place]) 04/08/2015  . Osteoporosis, post-menopausal 11/25/2014  . Bilateral hand pain 04/13/2014  . Vitamin D deficiency 08/18/2013  . Colon cancer ([place]) 08/18/2013  . Mitral valve disease 05/27/2013  . Hyperlipidemia 03/25/2013  . Expected blood loss anemia 03/05/2013  . S/P right THA, AA 03/04/2013  . Essential hypertension, benign 02/16/2013  . COPD (chronic obstructive pulmonary disease) ([place]) 02/16/2013  . Arthritis 10/12/2011   Outpatient Encounter Prescriptions as of 08/21/2017  Medication Sig  . aspirin 81 MG tablet Take 81 mg by mouth 3 (three) times a week.  Marland Kitchen atorvastatin (LIPITOR) 10 MG tablet TAKE 1/2 TABLET AT BEDTIME  . beta carotene w/minerals (OCUVITE) tablet Take 1 tablet by mouth daily.    . Calcium Citrate-Vitamin D (CITRACAL + D PO) Take 2 tablets by mouth daily.   . Cholecalciferol (VITAMIN D3) 2000 UNITS TABS Take 1 tablet by mouth daily.   . hydrochlorothiazide (MICROZIDE) 12.5 MG capsule TAKE ONE (1) CAPSULE EACH DAY  . levothyroxine (SYNTHROID, LEVOTHROID) 25 MCG tablet TAKE 2 TABLETS DAILY MONDAY-FRIDAY TAKE 1 TABLET DAILY SATURDAY & SUNDAY  . losartan (COZAAR) 100 MG tablet TAKE ONE (1) TABLET EACH DAY  . mupirocin ointment (BACTROBAN) 2 % Apply 1 application topically 2 (two) times daily.  . Omega-3 Fatty Acids (FISH OIL PO) Take 1 tablet by mouth daily.   No facility-administered encounter medications on file as of 08/21/2017.       Review of Systems  Constitutional: Negative.   HENT: Negative.   Eyes: Negative.   Respiratory: Negative.     Cardiovascular: Negative.   Gastrointestinal: Negative.   Endocrine: Negative.   Genitourinary: Negative.   Musculoskeletal: Negative.   Skin: Positive for wound (right arm wound ).  Allergic/Immunologic: Negative.   Neurological: Negative.   Hematological: Negative.   Psychiatric/Behavioral: Negative.        Objective:   Physical Exam  Constitutional: She is oriented to person, place, and time. She appears well-developed and well-nourished. No distress.  Patient is pleasant and alert  HENT:  Head: Normocephalic and atraumatic.  Bruising is resolving from her face  Eyes: Pupils are equal, round, and reactive to light. Conjunctivae and EOM are normal. Right eye exhibits no discharge. Left eye exhibits no discharge. No scleral icterus.  Neck: Normal range of motion.  Musculoskeletal: Normal range of motion.  Neurological: She is alert and oriented to person, place, and time.  Skin: Skin is warm and dry. No rash noted.  The right arm wound has improved significantly.  The hematoma is resolving.  There is no sign of any infection or significant drainage of any purulence.  The wound was cleansed with Betadine solution and redressed it with elastic netting.  Psychiatric: She has a normal mood and affect. Her behavior is normal. Judgment and thought content normal.  Nursing note and vitals reviewed.   BP (!) 158/84 (BP Location: Right Arm)   Pulse 76   Temp (!) 97.3 F (36.3 C) (Oral)   Ht 5\' 2"  (1.575 m)   Wt 110  lb (49.9 kg)   BMI 20.12 kg/m        Assessment & Plan:  1. Hematoma -The hematoma beneath the wound appears much smaller at this visit and appears to be resolving though slowly.  2. Laceration of right wrist, subsequent encounter -The laceration is still present but the edges seem to be closing on their own with the resolving of the hematoma and there is no purulent drainage from the wound.  3. Contusion of face, subsequent encounter -Appears to be almost  completely resolved.  4. Skin tear of forearm without complication, right, subsequent encounter -Continue with dressings follow-up in a couple weeks if necessary.  Patient's daughter will call sooner if there is any need to see her before that time.  Patient Instructions  Continue to cleanse the wound with saline as you have been doing Apply Bactroban ointment after cleansing and dressing Return to the office in a couple weeks and if the wound is greatly improved by that time you can call and cancel that appointment and come in 4 weeks instead Call sooner if any sign of infection appears to be occurring like redness or purulent drainage  Arrie Senate MD

## 2017-09-04 ENCOUNTER — Ambulatory Visit: Payer: Medicare Other | Admitting: Family Medicine

## 2017-09-07 ENCOUNTER — Other Ambulatory Visit: Payer: Self-pay | Admitting: Family Medicine

## 2017-09-18 ENCOUNTER — Ambulatory Visit: Payer: Medicare Other | Admitting: Family Medicine

## 2017-10-03 ENCOUNTER — Encounter: Payer: Self-pay | Admitting: Family Medicine

## 2017-10-03 ENCOUNTER — Ambulatory Visit (INDEPENDENT_AMBULATORY_CARE_PROVIDER_SITE_OTHER): Payer: Medicare Other

## 2017-10-03 ENCOUNTER — Ambulatory Visit: Payer: Medicare Other | Admitting: Family Medicine

## 2017-10-03 VITALS — BP 147/75 | HR 69 | Temp 96.8°F | Ht 62.0 in | Wt 110.0 lb

## 2017-10-03 DIAGNOSIS — I700 Atherosclerosis of aorta: Secondary | ICD-10-CM | POA: Diagnosis not present

## 2017-10-03 DIAGNOSIS — C187 Malignant neoplasm of sigmoid colon: Secondary | ICD-10-CM | POA: Diagnosis not present

## 2017-10-03 DIAGNOSIS — J430 Unilateral pulmonary emphysema [MacLeod's syndrome]: Secondary | ICD-10-CM | POA: Diagnosis not present

## 2017-10-03 DIAGNOSIS — E7800 Pure hypercholesterolemia, unspecified: Secondary | ICD-10-CM

## 2017-10-03 DIAGNOSIS — E559 Vitamin D deficiency, unspecified: Secondary | ICD-10-CM | POA: Diagnosis not present

## 2017-10-03 DIAGNOSIS — M25561 Pain in right knee: Secondary | ICD-10-CM | POA: Diagnosis not present

## 2017-10-03 DIAGNOSIS — I10 Essential (primary) hypertension: Secondary | ICD-10-CM

## 2017-10-03 DIAGNOSIS — M1711 Unilateral primary osteoarthritis, right knee: Secondary | ICD-10-CM

## 2017-10-03 DIAGNOSIS — E039 Hypothyroidism, unspecified: Secondary | ICD-10-CM | POA: Diagnosis not present

## 2017-10-03 NOTE — Patient Instructions (Addendum)
Medicare Annual Wellness Visit  [facility] and the medical providers at [facility] strive to bring you the best medical care.  In doing so we not only want to address your current medical conditions and concerns but also to detect new conditions early and prevent illness, disease and health-related problems.    Medicare offers a yearly Wellness Visit which allows our clinical staff to assess your need for preventative services including immunizations, lifestyle education, counseling to decrease risk of preventable diseases and screening for fall risk and other medical concerns.    This visit is provided free of charge (no copay) for all Medicare recipients. The clinical pharmacists at [facility] have begun to conduct these Wellness Visits which will also include a thorough review of all your medications.    As you primary medical provider recommend that you make an appointment for your Annual Wellness Visit if you have not done so already this year.  You may set up this appointment before you leave today or you may call back ([phone]) and schedule an appointment.  Please make sure when you call that you mention that you are scheduling your Annual Wellness Visit with the clinical pharmacist so that the appointment may be made for the proper length of time.     Continue current medications. Continue good therapeutic lifestyle changes which include good diet and exercise. Fall precautions discussed with patient. If an FOBT was given today- please return it to our front desk. If you are over 31 years old - you may need Prevnar 41 or the adult Pneumonia vaccine.  **Flu shots are available--- please call and schedule a FLU-CLINIC appointment**  After your visit with [place] today you will receive a survey in the mail or online from [employer] regarding your care with [place]. Please take a moment to fill this out. Your feedback is very  important to [place] as you can help [place] better understand your patient needs as well as improve your experience and satisfaction. WE CARE ABOUT YOU!!!   Continue to be careful and move slowly do not climb and make every effort not to fall Drink plenty of fluids and stay well-hydrated We will call you with lab work results as soon as those results become available We will make sure that we get you an appointment to see the orthopedic surgeon that comes in January.

## 2017-10-03 NOTE — Progress Notes (Signed)
Subjective:    Patient ID: Brianna [name], female    DOB: Sep 11, 1925, 83 y.o.   MRN: [id number]  HPI Pt here for follow up and management of chronic medical problems which includes hypertension and hyperlipidemia. She is taking medication regularly.  The patient is doing well overall.  She does complain of ongoing right knee pain and would like to see the orthopedic surgeon again.  She also has generalized arthralgias.  The wound to her right arm has healed well.  She will get a chest x-ray lab work today.  The patient denies any chest pain or shortness of breath.  She denies any trouble with her stomach including nausea vomiting diarrhea blood in the stool or change in bowel habits.  She is passing her water without problems just frequency and especially at nighttime.  She does have this history of colon cancer.     Patient Active Problem List   Diagnosis Date Noted  . Enlarged heart 04/08/2015  . Thoracic aortic atherosclerosis ([place]) 04/08/2015  . Osteoporosis, post-menopausal 11/25/2014  . Bilateral hand pain 04/13/2014  . Vitamin D deficiency 08/18/2013  . Colon cancer ([place]) 08/18/2013  . Mitral valve disease 05/27/2013  . Hyperlipidemia 03/25/2013  . Expected blood loss anemia 03/05/2013  . S/P right THA, AA 03/04/2013  . Essential hypertension, benign 02/16/2013  . COPD (chronic obstructive pulmonary disease) ([place]) 02/16/2013  . Arthritis 10/12/2011   Outpatient Encounter Medications as of 10/03/2017  Medication Sig  . aspirin 81 MG tablet Take 81 mg by mouth 3 (three) times a week.  Marland Kitchen atorvastatin (LIPITOR) 10 MG tablet TAKE 1/2 TABLET AT BEDTIME  . beta carotene w/minerals (OCUVITE) tablet Take 1 tablet by mouth daily.    . Calcium Citrate-Vitamin D (CITRACAL + D PO) Take 2 tablets by mouth daily.   . Cholecalciferol (VITAMIN D3) 2000 UNITS TABS Take 1 tablet by mouth daily.   . hydrochlorothiazide (MICROZIDE) 12.5 MG capsule TAKE ONE (1) CAPSULE EACH DAY  . levothyroxine  (SYNTHROID, LEVOTHROID) 25 MCG tablet TAKE 2 TABLETS DAILY MONDAY-FRIDAY TAKE 1 TABLET DAILY SATURDAY & SUNDAY  . losartan (COZAAR) 100 MG tablet TAKE ONE (1) TABLET EACH DAY  . mupirocin ointment (BACTROBAN) 2 % Apply 1 application topically 2 (two) times daily.  . Omega-3 Fatty Acids (FISH OIL PO) Take 1 tablet by mouth daily.   No facility-administered encounter medications on file as of 10/03/2017.      Review of Systems  Constitutional: Negative.   HENT: Negative.   Eyes: Negative.   Respiratory: Negative.   Cardiovascular: Negative.   Gastrointestinal: Negative.   Endocrine: Negative.   Genitourinary: Negative.   Musculoskeletal: Positive for arthralgias (right knee pain ).  Skin: Negative.   Allergic/Immunologic: Negative.   Neurological: Negative.   Hematological: Negative.   Psychiatric/Behavioral: Negative.        Objective:   Physical Exam  Constitutional: She is oriented to person, place, and time. She appears well-developed and well-nourished. No distress.  The patient is 83 years old but looks much younger than her stated age.  She does seem to have some slight memory impairment but nothing of significance.  She lives close to her daughter.  Her daughter keeps close check on her frequently.  HENT:  Head: Normocephalic and atraumatic.  Right Ear: External ear normal.  Left Ear: External ear normal.  Nose: Nose normal.  Mouth/Throat: Oropharynx is clear and moist.  Eyes: Conjunctivae and EOM are normal. Pupils are equal, round, and reactive to  light. Right eye exhibits no discharge. Left eye exhibits no discharge. No scleral icterus.  Neck: Normal range of motion. Neck supple. No thyromegaly present.  No bruits thyromegaly or anterior cervical adenopathy  Cardiovascular: Normal rate, normal heart sounds and intact distal pulses.  No murmur heard. Heart is slightly irregular at 72/min  Pulmonary/Chest: Effort normal and breath sounds normal. No respiratory  distress. She has no wheezes. She has no rales.  Clear anteriorly and posteriorly  Abdominal: Soft. Bowel sounds are normal. She exhibits no mass. There is no tenderness. There is no rebound and no guarding.  Midline abdominal incision without organ enlargement masses bruits or inguinal adenopathy  Musculoskeletal: Normal range of motion. She exhibits no edema.  Bilateral joint line tenderness at right knee  Lymphadenopathy:    She has no cervical adenopathy.  Neurological: She is alert and oriented to person, place, and time. She has normal reflexes. No cranial nerve deficit.  Skin: Skin is warm and dry. No rash noted.  Psychiatric: She has a normal mood and affect. Her behavior is normal. Judgment and thought content normal.  Nursing note and vitals reviewed.  BP (!) 147/75 (BP Location: Left Arm)   Pulse 69   Temp (!) 96.8 F (36 C) (Oral)   Ht _0  (1.575 m)   Wt 110 lb (49.9 kg)   BMI 20.12 kg/m         Assessment & Plan:  1. Pure hypercholesterolemia -Continue aggressive therapeutic lifestyle changes and omega-3 fatty acids pending results of lab work - CBC with Differential/Platelet - Lipid panel - DG Chest 2 View; Future  2. Essential hypertension, benign -Systolic reading on the blood pressure is slightly elevated today but the patient should continue with her current treatment and sodium restriction - BMP8+EGFR - CBC with Differential/Platelet - Hepatic function panel - DG Chest 2 View; Future  3. Vitamin D deficiency -Continue with vitamin D replacement pending results of lab work - CBC with Differential/Platelet - VITAMIN D 25 Hydroxy (Vit-D Deficiency, Fractures)  4. Hypothyroidism, unspecified type -Continue with current thyroid replacement pending results of lab work - CBC with Differential/Platelet - Thyroid Panel With TSH  5. Thoracic aortic atherosclerosis ([place]) -Continue with aggressive therapeutic lifestyle changes as much as possible which  include diet and exercise - CBC with Differential/Platelet - Lipid panel - Hepatic function panel - DG Chest 2 View; Future  6. Acute pain of right knee -The patient has had problems with this knee in the past and got results with an injection and would like to see the orthopedic surgeon again when he comes in January. - Ambulatory referral to Orthopedic Surgery - CBC with Differential/Platelet  7. Malignant neoplasm of sigmoid colon ([place]) -Continue to monitor stools closely and check FOBT's regularly  8. Unilateral emphysema ([facility]) -No complaints with breathing today.  9. Arthritis of right knee -Appointment with orthopedist in January  No orders of the defined types were placed in this encounter.  Patient Instructions                       Medicare Annual Wellness Visit  [facility] and the medical providers at [facility] strive to bring you the best medical care.  In doing so we not only want to address your current medical conditions and concerns but also to detect new conditions early and prevent illness, disease and health-related problems.    Medicare offers a yearly Wellness Visit which allows our clinical  staff to assess your need for preventative services including immunizations, lifestyle education, counseling to decrease risk of preventable diseases and screening for fall risk and other medical concerns.    This visit is provided free of charge (no copay) for all Medicare recipients. The clinical pharmacists at [facility] have begun to conduct these Wellness Visits which will also include a thorough review of all your medications.    As you primary medical provider recommend that you make an appointment for your Annual Wellness Visit if you have not done so already this year.  You may set up this appointment before you leave today or you may call back ([phone]) and schedule an appointment.  Please make sure when you call that  you mention that you are scheduling your Annual Wellness Visit with the clinical pharmacist so that the appointment may be made for the proper length of time.     Continue current medications. Continue good therapeutic lifestyle changes which include good diet and exercise. Fall precautions discussed with patient. If an FOBT was given today- please return it to our front desk. If you are over 25 years old - you may need Prevnar 9 or the adult Pneumonia vaccine.  **Flu shots are available--- please call and schedule a FLU-CLINIC appointment**  After your visit with [place] today you will receive a survey in the mail or online from [employer] regarding your care with [place]. Please take a moment to fill this out. Your feedback is very important to [place] as you can help [place] better understand your patient needs as well as improve your experience and satisfaction. WE CARE ABOUT YOU!!!   Continue to be careful and move slowly do not climb and make every effort not to fall Drink plenty of fluids and stay well-hydrated We will call you with lab work results as soon as those results become available We will make sure that we get you an appointment to see the orthopedic surgeon that comes in January.  Arrie Senate MD

## 2017-10-04 LAB — CBC WITH DIFFERENTIAL/PLATELET
BASOS: 1 %
Basophils Absolute: 0 10*3/uL (ref 0.0–0.2)
EOS (ABSOLUTE): 0.1 10*3/uL (ref 0.0–0.4)
EOS: 1 %
HEMATOCRIT: 36.9 % (ref 34.0–46.6)
HEMOGLOBIN: 12.1 g/dL (ref 11.1–15.9)
IMMATURE GRANS (ABS): 0 10*3/uL (ref 0.0–0.1)
IMMATURE GRANULOCYTES: 0 %
LYMPHS: 27 %
Lymphocytes Absolute: 1.2 10*3/uL (ref 0.7–3.1)
MCH: 30.4 pg (ref 26.6–33.0)
MCHC: 32.8 g/dL (ref 31.5–35.7)
MCV: 93 fL (ref 79–97)
MONOCYTES: 18 %
Monocytes Absolute: 0.8 10*3/uL (ref 0.1–0.9)
NEUTROS ABS: 2.3 10*3/uL (ref 1.4–7.0)
Neutrophils: 53 %
Platelets: 255 10*3/uL (ref 150–379)
RBC: 3.98 x10E6/uL (ref 3.77–5.28)
RDW: 13.8 % (ref 12.3–15.4)
WBC: 4.3 10*3/uL (ref 3.4–10.8)

## 2017-10-04 LAB — BMP8+EGFR
BUN/Creatinine Ratio: 22 (ref 12–28)
BUN: 21 mg/dL (ref 10–36)
CALCIUM: 9.8 mg/dL (ref 8.7–10.3)
CO2: 24 mmol/L (ref 20–29)
CREATININE: 0.94 mg/dL (ref 0.57–1.00)
Chloride: 95 mmol/L — ABNORMAL LOW (ref 96–106)
GFR, EST AFRICAN AMERICAN: 62 mL/min/{1.73_m2} (ref >59)
GFR, EST NON AFRICAN AMERICAN: 54 mL/min/{1.73_m2} — AB (ref >59)
Glucose: 88 mg/dL (ref 65–99)
POTASSIUM: 4.2 mmol/L (ref 3.5–5.2)
Sodium: 135 mmol/L (ref 134–144)

## 2017-10-04 LAB — THYROID PANEL WITH TSH
Free Thyroxine Index: 2.4 (ref 1.2–4.9)
T3 Uptake Ratio: 29 % (ref 24–39)
T4 TOTAL: 8.4 ug/dL (ref 4.5–12.0)
TSH: 1.99 u[IU]/mL (ref 0.450–4.500)

## 2017-10-04 LAB — HEPATIC FUNCTION PANEL
ALK PHOS: 61 IU/L (ref 39–117)
ALT: 13 IU/L (ref 0–32)
AST: 23 IU/L (ref 0–40)
Albumin: 4.3 g/dL (ref 3.2–4.6)
BILIRUBIN TOTAL: 0.5 mg/dL (ref 0.0–1.2)
Bilirubin, Direct: 0.18 mg/dL (ref 0.00–0.40)
Total Protein: 7 g/dL (ref 6.0–8.5)

## 2017-10-04 LAB — LIPID PANEL
CHOL/HDL RATIO: 2.2 ratio (ref 0.0–4.4)
Cholesterol, Total: 188 mg/dL (ref 100–199)
HDL: 87 mg/dL (ref >39)
LDL CALC: 91 mg/dL (ref 0–99)
Triglycerides: 50 mg/dL (ref 0–149)
VLDL CHOLESTEROL CAL: 10 mg/dL (ref 5–40)

## 2017-10-04 LAB — VITAMIN D 25 HYDROXY (VIT D DEFICIENCY, FRACTURES): Vit D, 25-Hydroxy: 64.3 ng/mL (ref 30.0–100.0)

## 2017-10-09 ENCOUNTER — Ambulatory Visit: Payer: Medicare Other | Admitting: Cardiology

## 2017-10-16 ENCOUNTER — Other Ambulatory Visit: Payer: Self-pay | Admitting: Family Medicine

## 2017-10-18 ENCOUNTER — Telehealth: Payer: Self-pay | Admitting: Family Medicine

## 2017-10-18 NOTE — Telephone Encounter (Signed)
Spoke to pt's daughter and advised of X-ray and lab results.

## 2017-11-07 ENCOUNTER — Other Ambulatory Visit: Payer: Self-pay | Admitting: Family Medicine

## 2017-11-21 NOTE — Progress Notes (Signed)
Cardiology Office Note  Date: 11/23/2017   ID: [name], Brianna [birth year], MRN [id number]  PCP: Chipper Herb, MD  Primary Cardiologist: Rozann Lesches, MD   Chief Complaint  Patient presents with  . Mitral valve disease    History of Present Illness: Brianna [name] is a 84 y.o. female last seen in September 2018. She presents today with her daughter for a follow-up visit. Her main complaint today is of arthritic right knee pain. She states that she just had a joint injection done and is hoping to feel better soon. She does not plan to pursue knee surgery.  Otherwise, she does not report any progressive shortness of breath, has had no recent dizzy spells or syncope. No major functional decline.  Her daughter had some questions today, asked if the patient had an "irregular heartbeat." She states that Dr. Laurance [name] had mentioned this to her before. She does not have any history of cardiac arrhythmias. She has had ectopy however documented on prior ECGs which I expect is contributing to her intermittent heart rate irregularity. On examination today her heart rate was regular. She also asked whether I anticipated any additional testing to be done from a cardiac perspective. I have actually discussed this many times with Brianna [name], and she has made it clear that she would not want to pursue valve surgery. We have elected to hold off on serial echocardiograms anticipating that her mitral regurgitation would eventually get worse anyway. We have been following her from a symptom perspective and also on assessment of her heart murmur.  I reviewed her most recent ECG from August 2018 which showed sinus rhythm with ventricular ectopy and nonconducted PACs.  Past Medical History:  Diagnosis Date  . Bilateral inguinal hernia (BIH)   . Carpal tunnel syndrome on right   . Colon polyp   . COPD (chronic obstructive pulmonary disease) ([place])   . Essential hypertension, benign   . Fibrocystic  breast   . Frequency of urination   . History of skin cancer   . Hypothyroidism   . Lumbosacral spondylosis without myelopathy   . Mixed hyperlipidemia   . Myxomatous mitral valve    Mild prolapse with eccentric, moderate to severe regurgitation  . Obturator hernia, right   . Osteoporosis   . PONV (postoperative nausea and vomiting)   . Rectosigmoid cancer (Brianna [name])    T1N0, 2004  . Symptomatic menopausal or female climacteric states   . Ventral hernia     Past Surgical History:  Procedure Laterality Date  . APPENDECTOMY    . CATARACT EXTRACTION W/ INTRAOCULAR LENS  IMPLANT, BILATERAL    . CESAREAN SECTION    . COLON SURGERY  2004   8'04-open LAR for rectosigmoid cancer T1N0 within polyp  . INGUINAL HERNIA REPAIR  11/21/2011   Procedure: LAPAROSCOPIC BILATERAL INGUINAL HERNIA REPAIR;  Surgeon: Adin Hector, MD;  Location: [facility];  Service: General;  Laterality: N/A;  . LAPAROSCOPY  11/21/2011   R obturator hernia repair  . TONSILLECTOMY    . TOTAL HIP ARTHROPLASTY Right 03/04/2013   Procedure: RIGHT TOTAL HIP ARTHROPLASTY ANTERIOR APPROACH;  Surgeon: Mauri Pole, MD;  Location: [facility];  Service: Orthopedics;  Laterality: Right;  . VENTRAL HERNIA REPAIR  11/21/2011   Procedure: LAPAROSCOPIC VENTRAL HERNIA;  Surgeon: Adin Hector, MD;  Location: [facility];  Service: General;  Laterality: N/A;  laparoscopic bilateral inguinal hernia repair with mesh and ventral hernia repair with mesh  Current Outpatient Medications  Medication Sig Dispense Refill  . aspirin 81 MG tablet Take 81 mg by mouth 3 (three) times a week.    Marland Kitchen atorvastatin (LIPITOR) 10 MG tablet TAKE 1/2 TABLET AT BEDTIME 45 tablet 0  . beta carotene w/minerals (OCUVITE) tablet Take 1 tablet by mouth daily.      . Calcium Citrate-Vitamin D (CITRACAL + D PO) Take 2 tablets by mouth daily.     . Cholecalciferol (VITAMIN D3) 2000 UNITS TABS Take 1 tablet by mouth daily.     . hydrochlorothiazide (MICROZIDE) 12.5 MG capsule  TAKE ONE (1) CAPSULE EACH DAY 90 capsule 3  . levothyroxine (SYNTHROID, LEVOTHROID) 25 MCG tablet TAKE 2 TABLETS DAILY MONDAY-FRIDAY & TAKE 1 TABLET DAILY SATURDAY & SUNDAY 154 tablet 3  . losartan (COZAAR) 100 MG tablet TAKE ONE (1) TABLET EACH DAY 90 tablet 1  . Omega-3 Fatty Acids (FISH OIL PO) Take 1 tablet by mouth daily.     No current facility-administered medications for this visit.    Allergies:  Alendronate sodium; Lodine [etodolac]; and Risedronate sodium   Social History: The patient  reports that  has never smoked. she has never used smokeless tobacco. She reports that she does not drink alcohol or use drugs.   ROS:  Please see the history of present illness. Otherwise, complete review of systems is positive for right knee pain.  All other systems are reviewed and negative.   Physical Exam: VS:  BP (!) 158/82   Pulse 77   Ht 5\' 2"  (1.575 m)   Wt 109 lb 3.2 oz (49.5 kg)   SpO2 97%   BMI 19.97 kg/m , BMI Body mass index is 19.97 kg/m.  Wt Readings from Last 3 Encounters:  11/23/17 109 lb 3.2 oz (49.5 kg)  10/03/17 110 lb (49.9 kg)  08/21/17 110 lb (49.9 kg)    General: Elderly woman, appears comfortable at rest. HEENT: Conjunctiva and lids normal, oropharynx clear. Neck: Supple, no elevated JVP or carotid bruits, no thyromegaly. Lungs: Clear to auscultation, nonlabored breathing at rest. Cardiac: Regular rate and rhythm, no S3, 2/6 apical systolic murmur. Abdomen: Soft, nontender, bowel sounds present. Extremities: Mild ankle edema, distal pulses 2+. Skin: Warm and dry. Musculoskeletal: No kyphosis. Neuropsychiatric: Alert and oriented x3, affect grossly appropriate.  ECG: I personally reviewed the tracing from 06/08/2017 which showed sinus rhythm with PVCs and probable nonconducted PACs.  Recent Labwork: 10/03/2017: ALT 13; AST 23; BUN 21; Creatinine, Ser 0.94; Hemoglobin 12.1; Platelets 255; Potassium 4.2; Sodium 135; TSH 1.990     Component Value Date/Time    CHOL 188 10/03/2017 1028   TRIG 50 10/03/2017 1028   TRIG 62 08/24/2014 1230   HDL 87 10/03/2017 1028   HDL 72 08/24/2014 1230   CHOLHDL 2.2 10/03/2017 1028   CHOLHDL 2.7 03/25/2013 1255   VLDL 12 03/25/2013 1255   LDLCALC 91 10/03/2017 1028   LDLCALC 79 08/21/2013 0838    Other Studies Reviewed Today:  Echocardiogram 02/20/2013: Study Conclusions  - Left ventricle: The cavity size was normal. Wall thickness was increased in a pattern of mild LVH. There was mild concentric hypertrophy. Systolic function was normal. The estimated ejection fraction was in the range of 60% to 65%. Wall motion was normal; there were no regional wall motion abnormalities. Doppler parameters are consistent with abnormal left ventricular relaxation (grade 1 diastolic dysfunction). - Aortic valve: Trileaflet; mildly thickened, mildly calcified leaflets. Mild regurgitation. Mean gradient: 29mm Hg (S). Valve area: 2.4cm^2(VTI). -  Mitral valve: Mildly to moderately thickened leaflets - appear myxomatous. Mild prolapse, involving the anterior leaflet and the posterior leaflet. Moderate to severe regurgitation directed eccentrically. Unable to quantify more objectively based on limited interrogation. - Left atrium: The atrium was moderately dilated. - Right atrium: The atrium was mildly dilated. Central venous pressure: 38mm Hg (est). - Tricuspid valve: Mild regurgitation. - Pulmonic valve: Mild regurgitation directed eccentrically. - Pulmonary arteries: PA peak pressure: 21mm Hg (S). PA pressure: 51mm Hg (ED). - Pericardium, extracardiac: There was no pericardial effusion.  Assessment and Plan:  1. Mitral valve prolapse with moderate to severe mitral regurgitation as of 2014. We have been following this conservatively over time after previous discussions with the patient and her inclination not to pursue invasive testing. Her daughter asked about additional testing today,  and we did discuss the possibility of a follow-up echocardiogram, although not clear that this would ultimately change our treatment approach particularly at age 45. I offered to have this study ordered, however her daughter ultimately elected not to push for this at the present time. I am certainly willing to pursue further workup if their thought process changes, however realistic treatment options would be fairly limited at this point. Mitraclip might be a consideration.  2. History of episodic weakness, this has been less of an issue since last encounter. She does not report any palpitations or syncope.  3. Patient's daughter asked about irregular heartbeat, mentioned previously by Dr. Laurance [name] by report. She has no definitive diagnosis of atrial arrhythmias such as atrial fibrillation, we discussed this today. Her last ECG did show ectopy which could certainly make her heart rate irregular from time to time. On examination today, her heart rate was regular.  Current medicines were reviewed with the patient today.  Disposition: Follow-up in 6 months.  Signed, Satira Sark, MD, [facility] 11/23/2017 2:44 PM    [facility] [place], [place], [place] [address] Phone: [phone]; Fax: [phone]

## 2017-11-22 DIAGNOSIS — M1711 Unilateral primary osteoarthritis, right knee: Secondary | ICD-10-CM | POA: Diagnosis not present

## 2017-11-23 ENCOUNTER — Ambulatory Visit: Payer: Medicare Other | Admitting: Cardiology

## 2017-11-23 ENCOUNTER — Encounter: Payer: Self-pay | Admitting: Cardiology

## 2017-11-23 VITALS — BP 158/82 | HR 77 | Ht 62.0 in | Wt 109.2 lb

## 2017-11-23 DIAGNOSIS — Z8679 Personal history of other diseases of the circulatory system: Secondary | ICD-10-CM | POA: Diagnosis not present

## 2017-11-23 DIAGNOSIS — I341 Nonrheumatic mitral (valve) prolapse: Secondary | ICD-10-CM | POA: Diagnosis not present

## 2017-11-23 DIAGNOSIS — R531 Weakness: Secondary | ICD-10-CM | POA: Diagnosis not present

## 2017-11-23 DIAGNOSIS — I340 Nonrheumatic mitral (valve) insufficiency: Secondary | ICD-10-CM

## 2017-11-23 NOTE — Patient Instructions (Signed)

## 2017-11-26 ENCOUNTER — Other Ambulatory Visit: Payer: Self-pay | Admitting: Family Medicine

## 2018-04-02 ENCOUNTER — Encounter: Payer: Self-pay | Admitting: Family Medicine

## 2018-04-02 ENCOUNTER — Ambulatory Visit: Payer: Medicare Other | Admitting: Family Medicine

## 2018-04-02 VITALS — BP 158/75 | HR 72 | Temp 97.0°F | Ht 62.0 in | Wt 106.0 lb

## 2018-04-02 DIAGNOSIS — I700 Atherosclerosis of aorta: Secondary | ICD-10-CM

## 2018-04-02 DIAGNOSIS — E7800 Pure hypercholesterolemia, unspecified: Secondary | ICD-10-CM | POA: Diagnosis not present

## 2018-04-02 DIAGNOSIS — I10 Essential (primary) hypertension: Secondary | ICD-10-CM | POA: Diagnosis not present

## 2018-04-02 DIAGNOSIS — J410 Simple chronic bronchitis: Secondary | ICD-10-CM | POA: Diagnosis not present

## 2018-04-02 DIAGNOSIS — I739 Peripheral vascular disease, unspecified: Secondary | ICD-10-CM | POA: Insufficient documentation

## 2018-04-02 DIAGNOSIS — C187 Malignant neoplasm of sigmoid colon: Secondary | ICD-10-CM

## 2018-04-02 DIAGNOSIS — M1711 Unilateral primary osteoarthritis, right knee: Secondary | ICD-10-CM

## 2018-04-02 DIAGNOSIS — E559 Vitamin D deficiency, unspecified: Secondary | ICD-10-CM | POA: Diagnosis not present

## 2018-04-02 DIAGNOSIS — E039 Hypothyroidism, unspecified: Secondary | ICD-10-CM

## 2018-04-02 DIAGNOSIS — D692 Other nonthrombocytopenic purpura: Secondary | ICD-10-CM | POA: Insufficient documentation

## 2018-04-02 NOTE — Progress Notes (Signed)
 Subjective:    Patient ID: Brianna [name], female    DOB: 04/17/1925, 84 y.o.   MRN: [id number]  HPI Pt here for follow up and management of chronic medical problems which includes hypertension and hyperlipidemia. She is taking medication regularly.  Patient continues to have some right knee pain and would like to see the orthopedic surgeon that comes here to visit.  She will have lab work done today.  Her initial blood pressure was elevated at 158/75.  She does not need any refills.  She has a history of colon cancer but does not have to go back and see the gastroenterologist.  The patient denies any chest pain pressure tightness or shortness of breath.  She denies any trouble or change in bowel habits or blood in the stool or black tarry bowel movements.  She does have some constipation and this is normal for her.  She is passing her water well but frequently.     Patient Active Problem List   Diagnosis Date Noted  . Enlarged heart 04/08/2015  . Thoracic aortic atherosclerosis (HCC) 04/08/2015  . Osteoporosis, post-menopausal 11/25/2014  . Bilateral hand pain 04/13/2014  . Vitamin D deficiency 08/18/2013  . Colon cancer (HCC) 08/18/2013  . Mitral valve disease 05/27/2013  . Hyperlipidemia 03/25/2013  . Expected blood loss anemia 03/05/2013  . S/P right THA, AA 03/04/2013  . Essential hypertension, benign 02/16/2013  . COPD (chronic obstructive pulmonary disease) (HCC) 02/16/2013  . Arthritis 10/12/2011   Outpatient Encounter Medications as of 04/02/2018  Medication Sig  . aspirin 81 MG tablet Take 81 mg by mouth 3 (three) times a week.  . atorvastatin (LIPITOR) 10 MG tablet TAKE 1/2 TABLET AT BEDTIME  . beta carotene w/minerals (OCUVITE) tablet Take 1 tablet by mouth daily.    . Calcium Citrate-Vitamin D (CITRACAL + D PO) Take 2 tablets by mouth daily.   . Cholecalciferol (VITAMIN D3) 2000 UNITS TABS Take 1 tablet by mouth daily.   . hydrochlorothiazide (MICROZIDE) 12.5 MG capsule  TAKE ONE (1) CAPSULE EACH DAY  . levothyroxine (SYNTHROID, LEVOTHROID) 25 MCG tablet TAKE 2 TABLETS DAILY MONDAY-FRIDAY & TAKE 1 TABLET DAILY SATURDAY & SUNDAY  . losartan (COZAAR) 100 MG tablet TAKE ONE (1) TABLET EACH DAY  . Omega-3 Fatty Acids (FISH OIL PO) Take 1 tablet by mouth daily.   No facility-administered encounter medications on file as of 04/02/2018.      Review of Systems  Constitutional: Negative.   HENT: Negative.   Eyes: Negative.   Respiratory: Negative.   Cardiovascular: Negative.   Gastrointestinal: Negative.   Endocrine: Negative.   Genitourinary: Negative.   Musculoskeletal: Positive for arthralgias (right knee pain ).  Skin: Negative.   Allergic/Immunologic: Negative.   Neurological: Negative.   Hematological: Negative.   Psychiatric/Behavioral: Negative.        Objective:   Physical Exam  Constitutional: She is oriented to person, place, and time. She appears well-developed and well-nourished. No distress.  The patient is pleasant calm and alert and mostly complains of arthritis in her hands and her knee.  HENT:  Head: Normocephalic and atraumatic.  Right Ear: External ear normal.  Left Ear: External ear normal.  Nose: Nose normal.  Mouth/Throat: Oropharynx is clear and moist. No oropharyngeal exudate.  Eyes: Pupils are equal, round, and reactive to light. Conjunctivae and EOM are normal. Right eye exhibits no discharge. Left eye exhibits no discharge. No scleral icterus.  She is up-to-date on her eye exams.  Neck:   Normal range of motion. Neck supple. No thyromegaly present.  No bruits thyromegaly or anterior cervical adenopathy  Cardiovascular: Normal rate, regular rhythm and normal heart sounds.  No murmur heard. Heart has a regular rate and rhythm at 72/min with diminished pedal pulses bilaterally.  Pulmonary/Chest: Effort normal and breath sounds normal. She has no wheezes. She has no rales.  Clear anteriorly and posteriorly  Abdominal: Soft.  Bowel sounds are normal. She exhibits no mass. There is no tenderness. There is no rebound and no guarding.  No liver or spleen enlargement.  No epigastric tenderness.  No masses no bruits and no inguinal adenopathy.  Musculoskeletal: She exhibits tenderness and deformity. She exhibits no edema.  Patient has arthritis and the PIP and DIP joints of both hands.  She also complains with arthritis and stiffness in the left knee.  She has seen the orthopedic surgeon and would like to see him again because of worsening pain in the right knee.  Lymphadenopathy:    She has no cervical adenopathy.  Neurological: She is alert and oriented to person, place, and time. She has normal reflexes. No cranial nerve deficit. Coordination normal.  Skin: Skin is warm and dry. No rash noted.  Psychiatric: She has a normal mood and affect. Her behavior is normal. Judgment and thought content normal.  Patient is pleasant and alert and looks extremely well for 84 years of age.  Nursing note and vitals reviewed.   BP (!) 158/75 (BP Location: Left Arm)   Pulse 72   Temp (!) 97 F (36.1 C) (Oral)   Ht 5' 2" (1.575 m)   Wt 106 lb (48.1 kg)   BMI 19.39 kg/m        Assessment & Plan:  1. Essential hypertension, benign -Systolic blood pressure slightly elevated today but no changes will be made in treatment because of her age. - BMP8+EGFR - CBC with Differential/Platelet - Hepatic function panel  2. Pure hypercholesterolemia -Pending results of lab work continue atorvastatin and as aggressive therapeutic lifestyle changes as possible - CBC with Differential/Platelet - Lipid panel  3. Vitamin D deficiency -Continue with vitamin D replacement pending results of lab work - CBC with Differential/Platelet - VITAMIN D 25 Hydroxy (Vit-D Deficiency, Fractures)  4. Hypothyroidism, unspecified type -Continue with thyroid replacement pending results of lab work - CBC with Differential/Platelet - Thyroid Panel With  TSH  5. Thoracic aortic atherosclerosis ([place]) -10 you with atorvastatin and as aggressive therapeutic lifestyle changes as possible pending results of lab work - CBC with Differential/Platelet - Lipid panel  6. Malignant neoplasm of sigmoid colon [facility]) -Patient was told not to come back to see the gastroenterologist and we will continue to follow her abdomen and check her periodically for blood loss in the stool.  7. Arthritis of right knee -Appointment with Dr. Ricki Rodriguez   8. Simple chronic bronchitis (HCC) -Continue to avoid irritating environments and take Mucinex as needed for cough and congestion  9. Peripheral vascular insufficiency (HCC) -The patient has no complaints with claudication but does have diminished pedal pulses in both feet.  10. Senile purpura ([place]) -She has thin skin with bruises on both arms.  Patient Instructions                       Medicare Annual Wellness Visit  [facility] and the medical providers at [facility] strive to bring you the best medical care.  In doing so we not only want to  address your current medical conditions and concerns but also to detect new conditions early and prevent illness, disease and health-related problems.    Medicare offers a yearly Wellness Visit which allows our clinical staff to assess your need for preventative services including immunizations, lifestyle education, counseling to decrease risk of preventable diseases and screening for fall risk and other medical concerns.    This visit is provided free of charge (no copay) for all Medicare recipients. The clinical pharmacists at [facility] have begun to conduct these Wellness Visits which will also include a thorough review of all your medications.    As you primary medical provider recommend that you make an appointment for your Annual Wellness Visit if you have not done so already this year.  You may set up this appointment  before you leave today or you may call back ([phone]) and schedule an appointment.  Please make sure when you call that you mention that you are scheduling your Annual Wellness Visit with the clinical pharmacist so that the appointment may be made for the proper length of time.     Continue current medications. Continue good therapeutic lifestyle changes which include good diet and exercise. Fall precautions discussed with patient. If an FOBT was given today- please return it to our front desk. If you are over 36 years old - you may need Prevnar 69 or the adult Pneumonia vaccine.  **Flu shots are available--- please call and schedule a FLU-CLINIC appointment**  After your visit with [place] today you will receive a survey in the mail or online from [employer] regarding your care with [place]. Please take a moment to fill this out. Your feedback is very important to [place] as you can help [place] better understand your patient needs as well as improve your experience and satisfaction. WE CARE ABOUT YOU!!!   We will schedule you for a visit to see Dr. Ricki Rodriguez because of your ongoing right knee pain when he comes to this office. Please call and get a follow-up appointment with the cardiologist, Dr. Domenic Polite in [place] Try to drink plenty of water and fluids and stay well-hydrated this will help the constipation   Arrie Senate MD

## 2018-04-02 NOTE — Patient Instructions (Addendum)
Medicare Annual Wellness Visit  [facility] and the medical providers at [facility] strive to bring you the best medical care.  In doing so we not only want to address your current medical conditions and concerns but also to detect new conditions early and prevent illness, disease and health-related problems.    Medicare offers a yearly Wellness Visit which allows our clinical staff to assess your need for preventative services including immunizations, lifestyle education, counseling to decrease risk of preventable diseases and screening for fall risk and other medical concerns.    This visit is provided free of charge (no copay) for all Medicare recipients. The clinical pharmacists at [facility] have begun to conduct these Wellness Visits which will also include a thorough review of all your medications.    As you primary medical provider recommend that you make an appointment for your Annual Wellness Visit if you have not done so already this year.  You may set up this appointment before you leave today or you may call back ([phone]) and schedule an appointment.  Please make sure when you call that you mention that you are scheduling your Annual Wellness Visit with the clinical pharmacist so that the appointment may be made for the proper length of time.     Continue current medications. Continue good therapeutic lifestyle changes which include good diet and exercise. Fall precautions discussed with patient. If an FOBT was given today- please return it to our front desk. If you are over 51 years old - you may need Prevnar 55 or the adult Pneumonia vaccine.  **Flu shots are available--- please call and schedule a FLU-CLINIC appointment**  After your visit with [place] today you will receive a survey in the mail or online from [employer] regarding your care with [place]. Please take a moment to fill this out. Your feedback is very  important to [place] as you can help [place] better understand your patient needs as well as improve your experience and satisfaction. WE CARE ABOUT YOU!!!   We will schedule you for a visit to see Dr. Ricki Rodriguez because of your ongoing right knee pain when he comes to this office. Please call and get a follow-up appointment with the cardiologist, Dr. Domenic Polite in [place] Try to drink plenty of water and fluids and stay well-hydrated this will help the constipation

## 2018-04-02 NOTE — Addendum Note (Signed)
Addended by: Zannie Cove on: 04/02/2018 10:20 AM   Modules accepted: Orders

## 2018-04-03 LAB — CBC WITH DIFFERENTIAL/PLATELET
BASOS ABS: 0 10*3/uL (ref 0.0–0.2)
Basos: 0 %
EOS (ABSOLUTE): 0.1 10*3/uL (ref 0.0–0.4)
Eos: 1 %
HEMOGLOBIN: 12.9 g/dL (ref 11.1–15.9)
Hematocrit: 37.9 % (ref 34.0–46.6)
IMMATURE GRANULOCYTES: 0 %
Immature Grans (Abs): 0 10*3/uL (ref 0.0–0.1)
Lymphocytes Absolute: 1.3 10*3/uL (ref 0.7–3.1)
Lymphs: 27 %
MCH: 31.3 pg (ref 26.6–33.0)
MCHC: 34 g/dL (ref 31.5–35.7)
MCV: 92 fL (ref 79–97)
MONOCYTES: 17 %
Monocytes Absolute: 0.8 10*3/uL (ref 0.1–0.9)
Neutrophils Absolute: 2.7 10*3/uL (ref 1.4–7.0)
Neutrophils: 55 %
Platelets: 246 10*3/uL (ref 150–450)
RBC: 4.12 x10E6/uL (ref 3.77–5.28)
RDW: 13.8 % (ref 12.3–15.4)
WBC: 4.9 10*3/uL (ref 3.4–10.8)

## 2018-04-03 LAB — HEPATIC FUNCTION PANEL
ALK PHOS: 65 IU/L (ref 39–117)
ALT: 15 IU/L (ref 0–32)
AST: 22 IU/L (ref 0–40)
Albumin: 4.6 g/dL (ref 3.2–4.6)
BILIRUBIN, DIRECT: 0.13 mg/dL (ref 0.00–0.40)
Bilirubin Total: 0.4 mg/dL (ref 0.0–1.2)
TOTAL PROTEIN: 7.1 g/dL (ref 6.0–8.5)

## 2018-04-03 LAB — VITAMIN D 25 HYDROXY (VIT D DEFICIENCY, FRACTURES): Vit D, 25-Hydroxy: 77.6 ng/mL (ref 30.0–100.0)

## 2018-04-03 LAB — LIPID PANEL
CHOLESTEROL TOTAL: 189 mg/dL (ref 100–199)
Chol/HDL Ratio: 1.9 ratio (ref 0.0–4.4)
HDL: 97 mg/dL (ref >39)
LDL CALC: 84 mg/dL (ref 0–99)
TRIGLYCERIDES: 40 mg/dL (ref 0–149)
VLDL CHOLESTEROL CAL: 8 mg/dL (ref 5–40)

## 2018-04-03 LAB — THYROID PANEL WITH TSH
Free Thyroxine Index: 2.6 (ref 1.2–4.9)
T3 Uptake Ratio: 27 % (ref 24–39)
T4, Total: 9.8 ug/dL (ref 4.5–12.0)
TSH: 2.34 u[IU]/mL (ref 0.450–4.500)

## 2018-04-03 LAB — BMP8+EGFR
BUN/Creatinine Ratio: 25 (ref 12–28)
BUN: 26 mg/dL (ref 10–36)
CALCIUM: 10.1 mg/dL (ref 8.7–10.3)
CHLORIDE: 96 mmol/L (ref 96–106)
CO2: 24 mmol/L (ref 20–29)
CREATININE: 1.05 mg/dL — AB (ref 0.57–1.00)
GFR calc Af Amer: 54 mL/min/{1.73_m2} — ABNORMAL LOW (ref >59)
GFR calc non Af Amer: 47 mL/min/{1.73_m2} — ABNORMAL LOW (ref >59)
Glucose: 91 mg/dL (ref 65–99)
Potassium: 4.5 mmol/L (ref 3.5–5.2)
Sodium: 135 mmol/L (ref 134–144)

## 2018-04-29 ENCOUNTER — Other Ambulatory Visit: Payer: Self-pay | Admitting: Cardiovascular Disease

## 2018-04-29 ENCOUNTER — Other Ambulatory Visit: Payer: Self-pay | Admitting: Family Medicine

## 2018-05-27 ENCOUNTER — Other Ambulatory Visit: Payer: Self-pay | Admitting: Family Medicine

## 2018-06-20 DIAGNOSIS — M1711 Unilateral primary osteoarthritis, right knee: Secondary | ICD-10-CM | POA: Diagnosis not present

## 2018-07-29 ENCOUNTER — Other Ambulatory Visit: Payer: Self-pay | Admitting: Cardiology

## 2018-08-01 NOTE — Progress Notes (Signed)
Cardiology Office Note  Date: 08/02/2018   ID: Brianna [name], DOB Mar 25, 1925, MRN [id number]  PCP: Chipper Herb, MD  Primary Cardiologist: Rozann Lesches, MD   Chief Complaint  Patient presents with  . Mitral valve disease    History of Present Illness: Brianna [name] is a 84 y.o. female last seen in January.  She is here today with her daughter for a follow-up visit.  She tells me that she is doing quite well, if it were not for arthritic pain in her right knee and hip.  She remains functional with ADLs around the house, also likes getting out of the yard when she can.  She describes NYHA class II dyspnea with typical activities.  She does try not to push herself.  No angina symptoms, palpitations, or syncope.  I reviewed her medications which are outlined below and stable.  She continues to follow with Dr. Laurance Flatten.  I personally reviewed her ECG today which shows sinus rhythm.  Past Medical History:  Diagnosis Date  . Bilateral inguinal hernia (BIH)   . Carpal tunnel syndrome on right   . Colon polyp   . COPD (chronic obstructive pulmonary disease) ([place])   . Essential hypertension, benign   . Fibrocystic breast   . Frequency of urination   . History of skin cancer   . Hypothyroidism   . Lumbosacral spondylosis without myelopathy   . Mixed hyperlipidemia   . Myxomatous mitral valve    Mild prolapse with eccentric, moderate to severe regurgitation  . Obturator hernia, right   . Osteoporosis   . PONV (postoperative nausea and vomiting)   . 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 Symptomatic menopausal or female climacteric states   . Ventral hernia     Past Surgical History:  Procedure Laterality Date  . APPENDECTOMY    . CATARACT EXTRACTION W/ INTRAOCULAR LENS  IMPLANT, BILATERAL    . CESAREAN SECTION    . COLON SURGERY  2004   8'04-open LAR for rectosigmoid cancer T1N0 within polyp  . INGUINAL HERNIA REPAIR  11/21/2011   Procedure: LAPAROSCOPIC  BILATERAL INGUINAL HERNIA REPAIR;  Surgeon: Adin Hector, MD;  Location: [facility];  Service: General;  Laterality: N/A;  . LAPAROSCOPY  11/21/2011   R obturator hernia repair  . TONSILLECTOMY    . TOTAL HIP ARTHROPLASTY Right 03/04/2013   Procedure: RIGHT TOTAL HIP ARTHROPLASTY ANTERIOR APPROACH;  Surgeon: Mauri Pole, MD;  Location: [facility];  Service: Orthopedics;  Laterality: Right;  . VENTRAL HERNIA REPAIR  11/21/2011   Procedure: LAPAROSCOPIC VENTRAL HERNIA;  Surgeon: Adin Hector, MD;  Location: [facility];  Service: General;  Laterality: N/A;  laparoscopic bilateral inguinal hernia repair with mesh and ventral hernia repair with mesh    Current Outpatient Medications  Medication Sig Dispense Refill  . aspirin 81 MG tablet Take 81 mg by mouth 3 (three) times a week.    Marland Kitchen atorvastatin (LIPITOR) 10 MG tablet TAKE 1/2 TABLET AT BEDTIME 45 tablet 1  . beta carotene w/minerals (OCUVITE) tablet Take 1 tablet by mouth daily.      . Calcium Citrate-Vitamin D (CITRACAL + D PO) Take 2 tablets by mouth daily.     . Cholecalciferol (VITAMIN D3) 2000 UNITS TABS Take 1 tablet by mouth daily.     . hydrochlorothiazide (MICROZIDE) 12.5 MG capsule TAKE ONE (1) CAPSULE EACH DAY 90 capsule 2  . levothyroxine (SYNTHROID, LEVOTHROID) 25 MCG tablet TAKE 2 TABLETS  DAILY MONDAY-FRIDAY & TAKE 1 TABLET DAILY SATURDAY & SUNDAY 154 tablet 3  . losartan (COZAAR) 100 MG tablet TAKE ONE (1) TABLET EACH DAY 90 tablet 1  . Omega-3 Fatty Acids (FISH OIL PO) Take 1 tablet by mouth daily.     No current facility-administered medications for this visit.    Allergies:  Alendronate sodium; Lodine [etodolac]; and Risedronate sodium   Social History: The patient  reports that she has never smoked. She has never used smokeless tobacco. She reports that she does not drink alcohol or use drugs.   ROS:  Please see the history of present illness. Otherwise, complete review of systems is positive for right knee and hip pain.  All  other systems are reviewed and negative.   Physical Exam: VS:  BP (!) 160/80   Pulse 82   Ht 5' (1.524 m)   Wt 108 lb (49 kg)   SpO2 98%   BMI 21.09 kg/m , BMI Body mass index is 21.09 kg/m.  Wt Readings from Last 3 Encounters:  08/02/18 108 lb (49 kg)  04/02/18 106 lb (48.1 kg)  11/23/17 109 lb 3.2 oz (49.5 kg)    General: Elderly woman, appears comfortable at rest. HEENT: Conjunctiva and lids normal, oropharynx clear. Neck: Supple, no elevated JVP or carotid bruits. Lungs: Clear to auscultation, nonlabored breathing at rest. Cardiac: Regular rate and rhythm, no S3, 3/6 mid-to-late systolic murmur best heard laterally, no pericardial rub. Abdomen: Soft, nontender, bowel sounds present. Extremities: Trace ankle edema, distal pulses 1-2+.  Wearing soft brace on right knee. Skin: Warm and dry. Musculoskeletal: No kyphosis. Neuropsychiatric: Alert and oriented x3, affect grossly appropriate.  ECG: I personally reviewed the tracing from 06/08/2017 which showed sinus rhythm with ventricular ectopy and nonconducted PACs.  Recent Labwork: 04/02/2018: ALT 15; AST 22; BUN 26; Creatinine, Ser 1.05; Hemoglobin 12.9; Platelets 246; Potassium 4.5; Sodium 135; TSH 2.340     Component Value Date/Time   CHOL 189 04/02/2018 0956   TRIG 40 04/02/2018 0956   TRIG 62 08/24/2014 1230   HDL 97 04/02/2018 0956   HDL 72 08/24/2014 1230   CHOLHDL 1.9 04/02/2018 0956   CHOLHDL 2.7 03/25/2013 1255   VLDL 12 03/25/2013 1255   LDLCALC 84 04/02/2018 0956   LDLCALC 79 08/21/2013 0838    Other Studies Reviewed Today:  Echocardiogram 02/20/2013: Study Conclusions  - Left ventricle: The cavity size was normal. Wall thickness was increased in a pattern of mild LVH. There was mild concentric hypertrophy. Systolic function was normal. The estimated ejection fraction was in the range of 60% to 65%. Wall motion was normal; there were no regional wall motion abnormalities. Doppler parameters  are consistent with abnormal left ventricular relaxation (grade 1 diastolic dysfunction). - Aortic valve: Trileaflet; mildly thickened, mildly calcified leaflets. Mild regurgitation. Mean gradient: 98mm Hg (S). Valve area: 2.4cm^2(VTI). - Mitral valve: Mildly to moderately thickened leaflets - appear myxomatous. Mild prolapse, involving the anterior leaflet and the posterior leaflet. Moderate to severe regurgitation directed eccentrically. Unable to quantify more objectively based on limited interrogation. - Left atrium: The atrium was moderately dilated. - Right atrium: The atrium was mildly dilated. Central venous pressure: 57mm Hg (est). - Tricuspid valve: Mild regurgitation. - Pulmonic valve: Mild regurgitation directed eccentrically. - Pulmonary arteries: PA peak pressure: 59mm Hg (S). PA pressure: 66mm Hg (ED). - Pericardium, extracardiac: There was no pericardial effusion.  Assessment and Plan:  1.  Mitral valve prolapse with moderate to severe eccentric mitral regurgitation based on  prior echocardiogram in 2014.  She has remained remarkably stable with conservative follow-up.  No significant change on examination.  Today we discussed the potential for follow-up echocardiography to assess for possible candidacy to consider a Mitraclip, although with very stable symptoms she remains comfortable with observation alone at this time.  2.  Essential hypertension.  She continues on Cozaar and hydrochlorothiazide.  Keep follow-up with Dr. Hulan Saas.  Current medicines were reviewed with the patient today.   Orders Placed This Encounter  Procedures  . EKG 12-Lead    Disposition: Follow-up in 6 months, sooner if needed.  Signed, Satira Sark, MD, [facility] 08/02/2018 1:13 PM    [facility] [place], [place], [place] [address] Phone: [phone]; Fax: [phone]

## 2018-08-02 ENCOUNTER — Ambulatory Visit: Payer: Medicare Other | Admitting: Cardiology

## 2018-08-02 ENCOUNTER — Encounter: Payer: Self-pay | Admitting: Cardiology

## 2018-08-02 VITALS — BP 160/80 | HR 82 | Ht 60.0 in | Wt 108.0 lb

## 2018-08-02 DIAGNOSIS — Z23 Encounter for immunization: Secondary | ICD-10-CM | POA: Diagnosis not present

## 2018-08-02 DIAGNOSIS — I10 Essential (primary) hypertension: Secondary | ICD-10-CM

## 2018-08-02 DIAGNOSIS — I340 Nonrheumatic mitral (valve) insufficiency: Secondary | ICD-10-CM | POA: Diagnosis not present

## 2018-08-02 NOTE — Patient Instructions (Addendum)

## 2018-10-01 NOTE — Patient Instructions (Addendum)
Medicare Annual Wellness Visit  [facility] and the medical providers at [facility] strive to bring you the best medical care.  In doing so we not only want to address your current medical conditions and concerns but also to detect new conditions early and prevent illness, disease and health-related problems.    Medicare offers a yearly Wellness Visit which allows our clinical staff to assess your need for preventative services including immunizations, lifestyle education, counseling to decrease risk of preventable diseases and screening for fall risk and other medical concerns.    This visit is provided free of charge (no copay) for all Medicare recipients. The clinical pharmacists at [facility] have begun to conduct these Wellness Visits which will also include a thorough review of all your medications.    As you primary medical provider recommend that you make an appointment for your Annual Wellness Visit if you have not done so already this year.  You may set up this appointment before you leave today or you may call back ([phone]) and schedule an appointment.  Please make sure when you call that you mention that you are scheduling your Annual Wellness Visit with the clinical pharmacist so that the appointment may be made for the proper length of time.   Continue current medications. Continue good therapeutic lifestyle changes which include good diet and exercise. Fall precautions discussed with patient. If an FOBT was given today- please return it to our front desk. If you are over 81 years old - you may need Prevnar 63 or the adult Pneumonia vaccine.  **Flu shots are available--- please call and schedule a FLU-CLINIC appointment**  After your visit with [place] today you will receive a survey in the mail or online from [employer] regarding your care with [place]. Please take a moment to fill this out. Your feedback is very important  to [place] as you can help [place] better understand your patient needs as well as improve your experience and satisfaction. WE CARE ABOUT YOU!!!   Drink plenty of water Elevate legs a couple of times during the day for at least 20 minutes higher than the level of the heart Avoid sodium as much as possible Continue to be careful and not be in a hurry so as not to put yourself at risk for falling Wear support hose as this will help you with reading the body of more fluid during the day so there would be less fluid retention at nighttime which would in turn help with not getting up as much during the night Take Tylenol for pain as needed 1 3 or 4 times daily would be safe

## 2018-10-01 NOTE — Progress Notes (Signed)
Subjective:    Patient ID: Brianna [name], female    DOB: November 09, 1924, 84 y.o.   MRN: [id number]  HPI  Pt is here today for a follow up of chronic medical conditions which include hypertension, hyperlipidemia, hypothyroidism and osteoporosis.  The patient is doing well but just complains of aching all over.  She is using Aspercreme and this seems to help.  She was given an FOBT to return and will get lab work today.  Her significant history is colon cancer.  Her blood pressure was slightly elevated on the systolic side today.  She is 84 years old.  She does not need any refills.  The patient is doing well overall and comes to the visit with her daughter today.  She complains of having to get up to go the bathroom at nighttime and we did recommend using some support hose during the day and elevating her feet during the day for a couple times of 20 minutes.  She denies any chest pain pressure tightness or shortness of breath.  She denies any trouble with swallowing heartburn indigestion nausea vomiting diarrhea blood in the stool black tarry bowel movements or change in bowel habits.  Her only issues with her voiding is the nocturia.      Patient Active Problem List   Diagnosis Date Noted  . Peripheral vascular insufficiency ([place]) 04/02/2018  . Senile purpura ([place]) 04/02/2018  . Simple chronic bronchitis ([place]) 04/02/2018  . Enlarged heart 04/08/2015  . Thoracic aortic atherosclerosis ([place]) 04/08/2015  . Osteoporosis, post-menopausal 11/25/2014  . Bilateral hand pain 04/13/2014  . Vitamin D deficiency 08/18/2013  . Colon cancer ([place]) 08/18/2013  . Mitral valve disease 05/27/2013  . Hyperlipidemia 03/25/2013  . Expected blood loss anemia 03/05/2013  . S/P right THA, AA 03/04/2013  . Essential hypertension, benign 02/16/2013  . COPD (chronic obstructive pulmonary disease) ([place]) 02/16/2013  . Arthritis 10/12/2011   Outpatient Encounter Medications as of 10/02/2018  Medication Sig  .  aspirin 81 MG tablet Take 81 mg by mouth 3 (three) times a week.  Marland Kitchen atorvastatin (LIPITOR) 10 MG tablet TAKE 1/2 TABLET AT BEDTIME  . beta carotene w/minerals (OCUVITE) tablet Take 1 tablet by mouth daily.    . Calcium Citrate-Vitamin D (CITRACAL + D PO) Take 2 tablets by mouth daily.   . Cholecalciferol (VITAMIN D3) 2000 UNITS TABS Take 1 tablet by mouth daily.   . hydrochlorothiazide (MICROZIDE) 12.5 MG capsule TAKE ONE (1) CAPSULE EACH DAY  . levothyroxine (SYNTHROID, LEVOTHROID) 25 MCG tablet TAKE 2 TABLETS DAILY MONDAY-FRIDAY & TAKE 1 TABLET DAILY SATURDAY & SUNDAY  . losartan (COZAAR) 100 MG tablet TAKE ONE (1) TABLET EACH DAY  . Omega-3 Fatty Acids (FISH OIL PO) Take 1 tablet by mouth daily.   No facility-administered encounter medications on file as of 10/02/2018.        Review of Systems  Respiratory: Positive for shortness of breath.   Cardiovascular: Positive for leg swelling.  Genitourinary: Positive for frequency (at night).  Musculoskeletal: Positive for arthralgias (all over).  Allergic/Immunologic: Positive for environmental allergies.  Neurological: Positive for dizziness and headaches.       Objective:   Physical Exam  Constitutional: She is oriented to person, place, and time. She appears well-developed and well-nourished. No distress.  The patient is pleasant and looks much younger than her stated age of 41 years.  She comes to the visit with her daughter Brianna [name].  HENT:  Head: Normocephalic and atraumatic.  Right Ear:  External ear normal.  Left Ear: External ear normal.  Nose: Nose normal.  Mouth/Throat: Oropharynx is clear and moist. No oropharyngeal exudate.  Eyes: Pupils are equal, round, and reactive to light. Conjunctivae and EOM are normal. Right eye exhibits no discharge. Left eye exhibits no discharge. No scleral icterus.  Neck: Normal range of motion. Neck supple. No thyromegaly present.  No bruits thyromegaly or anterior cervical adenopathy    Cardiovascular: Normal rate, normal heart sounds and intact distal pulses.  No murmur heard. The heart is slightly irregular with an occasional PVC at 72/min  Pulmonary/Chest: Effort normal and breath sounds normal. She has no wheezes. She has no rales.  Clear anteriorly and posteriorly  Abdominal: Soft. Bowel sounds are normal. She exhibits no mass. There is no tenderness.  Midline incision scar from colon cancer surgery.  There is some tenderness in the upper abdomen near the top of the scar area.  No masses organ enlargement or bruits  Musculoskeletal: She exhibits deformity. She exhibits no edema or tenderness.  Range of motion is normal but slow because of age and arthritis.  Patient has DIP joint swelling in the fingers of both hands or osteoarthritis  Lymphadenopathy:    She has no cervical adenopathy.  Neurological: She is alert and oriented to person, place, and time. She has normal reflexes. No cranial nerve deficit.  Skin: Skin is warm and dry. No rash noted.  Psychiatric: She has a normal mood and affect. Her behavior is normal. Judgment and thought content normal.  Mood affect and behavior were normal for this patient at her age.  Nursing note and vitals reviewed.   BP (!) 160/81   Pulse 76   Temp 97.9 F (36.6 C)   Ht 5' (1.524 m)   Wt 103 lb 3.2 oz (46.8 kg)   BMI 20.15 kg/m         Assessment & Plan:  1. Essential hypertension, benign -The blood pressure was slightly elevated but no change in medication. - CBC with Differential/Platelet; Future - Basic Metabolic Panel; Future  2. Pure hypercholesterolemia -Continue current treatment pending results of lab work - Lipid panel; Future - Basic Metabolic Panel; Future - Hepatic function panel; Future  3. Hypothyroidism, unspecified type -Continue current treatment pending results of lab work  4. Nocturia -Patient is having no burning or pain just nocturia.  We will check a urinalysis.  5. Peripheral  vascular insufficiency (H[facility]) -Continues to have diminished pulses in both feet.  6. Thoracic aortic atherosclerosis ([place]) -Continue aggressive therapeutic lifestyle changes including diet and exercise and continue with atorvastatin pending results of lab work  7. Malignant neoplasm of colon, unspecified part of colon ([place]) -The patient has done well following her colon surgery and we continue to monitor her belly.  Patient Instructions                       Medicare Annual Wellness Visit  [facility] and the medical providers at [facility] strive to bring you the best medical care.  In doing so we not only want to address your current medical conditions and concerns but also to detect new conditions early and prevent illness, disease and health-related problems.    Medicare offers a yearly Wellness Visit which allows our clinical staff to assess your need for preventative services including immunizations, lifestyle education, counseling to decrease risk of preventable diseases and screening for fall risk and other medical concerns.    This  visit is provided free of charge (no copay) for all Medicare recipients. The clinical pharmacists at [facility] have begun to conduct these Wellness Visits which will also include a thorough review of all your medications.    As you primary medical provider recommend that you make an appointment for your Annual Wellness Visit if you have not done so already this year.  You may set up this appointment before you leave today or you may call back ([phone]) and schedule an appointment.  Please make sure when you call that you mention that you are scheduling your Annual Wellness Visit with the clinical pharmacist so that the appointment may be made for the proper length of time.   Continue current medications. Continue good therapeutic lifestyle changes which include good diet and exercise. Fall precautions discussed  with patient. If an FOBT was given today- please return it to our front desk. If you are over 42 years old - you may need Prevnar 8 or the adult Pneumonia vaccine.  **Flu shots are available--- please call and schedule a FLU-CLINIC appointment**  After your visit with [place] today you will receive a survey in the mail or online from [employer] regarding your care with [place]. Please take a moment to fill this out. Your feedback is very important to [place] as you can help [place] better understand your patient needs as well as improve your experience and satisfaction. WE CARE ABOUT YOU!!!   Drink plenty of water Elevate legs a couple of times during the day for at least 20 minutes higher than the level of the heart Avoid sodium as much as possible Continue to be careful and not be in a hurry so as not to put yourself at risk for falling Wear support hose as this will help you with reading the body of more fluid during the day so there would be less fluid retention at nighttime which would in turn help with not getting up as much during the night Take Tylenol for pain as needed 1 3 or 4 times daily would be safe  Arrie Senate MD

## 2018-10-02 ENCOUNTER — Encounter: Payer: Self-pay | Admitting: Family Medicine

## 2018-10-02 ENCOUNTER — Ambulatory Visit: Payer: Medicare Other | Admitting: Family Medicine

## 2018-10-02 VITALS — BP 160/81 | HR 76 | Temp 97.9°F | Ht 60.0 in | Wt 103.2 lb

## 2018-10-02 DIAGNOSIS — C189 Malignant neoplasm of colon, unspecified: Secondary | ICD-10-CM

## 2018-10-02 DIAGNOSIS — E7800 Pure hypercholesterolemia, unspecified: Secondary | ICD-10-CM

## 2018-10-02 DIAGNOSIS — R351 Nocturia: Secondary | ICD-10-CM | POA: Diagnosis not present

## 2018-10-02 DIAGNOSIS — I10 Essential (primary) hypertension: Secondary | ICD-10-CM | POA: Diagnosis not present

## 2018-10-02 DIAGNOSIS — I739 Peripheral vascular disease, unspecified: Secondary | ICD-10-CM

## 2018-10-02 DIAGNOSIS — E039 Hypothyroidism, unspecified: Secondary | ICD-10-CM | POA: Diagnosis not present

## 2018-10-02 DIAGNOSIS — I700 Atherosclerosis of aorta: Secondary | ICD-10-CM

## 2018-10-02 LAB — MICROSCOPIC EXAMINATION

## 2018-10-02 LAB — URINALYSIS, ROUTINE W REFLEX MICROSCOPIC
Bilirubin, UA: NEGATIVE
Glucose, UA: NEGATIVE
Ketones, UA: NEGATIVE
Nitrite, UA: POSITIVE — AB
PH UA: 7 (ref 5.0–7.5)
PROTEIN UA: NEGATIVE
RBC UA: NEGATIVE
Specific Gravity, UA: 1.02 (ref 1.005–1.030)
Urobilinogen, Ur: 0.2 mg/dL (ref 0.2–1.0)

## 2018-10-02 NOTE — Addendum Note (Signed)
Addended by: Marin Olp on: 10/02/2018 10:44 AM   Modules accepted: Orders

## 2018-10-02 NOTE — Addendum Note (Signed)
Addended by: Earlene Plater on: 10/02/2018 10:57 AM   Modules accepted: Orders

## 2018-10-03 LAB — HEPATIC FUNCTION PANEL
ALK PHOS: 64 IU/L (ref 39–117)
ALT: 15 IU/L (ref 0–32)
AST: 24 IU/L (ref 0–40)
Albumin: 4.2 g/dL (ref 3.2–4.6)
Bilirubin Total: 0.4 mg/dL (ref 0.0–1.2)
Bilirubin, Direct: 0.14 mg/dL (ref 0.00–0.40)
Total Protein: 6.7 g/dL (ref 6.0–8.5)

## 2018-10-03 LAB — CBC WITH DIFFERENTIAL/PLATELET
Basophils Absolute: 0 10*3/uL (ref 0.0–0.2)
Basos: 0 %
EOS (ABSOLUTE): 0.1 10*3/uL (ref 0.0–0.4)
Eos: 1 %
Hematocrit: 35 % (ref 34.0–46.6)
Hemoglobin: 11.8 g/dL (ref 11.1–15.9)
IMMATURE GRANS (ABS): 0 10*3/uL (ref 0.0–0.1)
IMMATURE GRANULOCYTES: 0 %
Lymphocytes Absolute: 1.3 10*3/uL (ref 0.7–3.1)
Lymphs: 28 %
MCH: 30.8 pg (ref 26.6–33.0)
MCHC: 33.7 g/dL (ref 31.5–35.7)
MCV: 91 fL (ref 79–97)
Monocytes Absolute: 0.6 10*3/uL (ref 0.1–0.9)
Monocytes: 14 %
Neutrophils Absolute: 2.5 10*3/uL (ref 1.4–7.0)
Neutrophils: 57 %
Platelets: 213 10*3/uL (ref 150–450)
RBC: 3.83 x10E6/uL (ref 3.77–5.28)
RDW: 12.7 % (ref 12.3–15.4)
WBC: 4.5 10*3/uL (ref 3.4–10.8)

## 2018-10-03 LAB — BASIC METABOLIC PANEL
BUN/Creatinine Ratio: 18 (ref 12–28)
BUN: 15 mg/dL (ref 10–36)
CO2: 23 mmol/L (ref 20–29)
Calcium: 10.3 mg/dL (ref 8.7–10.3)
Chloride: 92 mmol/L — ABNORMAL LOW (ref 96–106)
Creatinine, Ser: 0.83 mg/dL (ref 0.57–1.00)
GFR calc Af Amer: 71 mL/min/{1.73_m2} (ref >59)
GFR calc non Af Amer: 62 mL/min/{1.73_m2} (ref >59)
Glucose: 97 mg/dL (ref 65–99)
Potassium: 4.6 mmol/L (ref 3.5–5.2)
Sodium: 134 mmol/L (ref 134–144)

## 2018-10-03 LAB — LIPID PANEL
Chol/HDL Ratio: 2.1 ratio (ref 0.0–4.4)
Cholesterol, Total: 176 mg/dL (ref 100–199)
HDL: 83 mg/dL (ref >39)
LDL Calculated: 84 mg/dL (ref 0–99)
Triglycerides: 46 mg/dL (ref 0–149)
VLDL CHOLESTEROL CAL: 9 mg/dL (ref 5–40)

## 2018-10-05 LAB — URINE CULTURE

## 2018-10-07 ENCOUNTER — Other Ambulatory Visit: Payer: Medicare Other

## 2018-10-07 DIAGNOSIS — Z1211 Encounter for screening for malignant neoplasm of colon: Secondary | ICD-10-CM | POA: Diagnosis not present

## 2018-10-08 ENCOUNTER — Other Ambulatory Visit: Payer: Self-pay | Admitting: Family Medicine

## 2018-10-08 ENCOUNTER — Other Ambulatory Visit: Payer: Self-pay | Admitting: *Deleted

## 2018-10-08 DIAGNOSIS — N3000 Acute cystitis without hematuria: Secondary | ICD-10-CM

## 2018-10-08 MED ORDER — CEFDINIR 300 MG PO CAPS: 300.0000 mg | ORAL_CAPSULE | Freq: Two times a day (BID) | ORAL | 0 refills | Status: AC

## 2018-10-09 LAB — FECAL OCCULT BLOOD, IMMUNOCHEMICAL: Fecal Occult Bld: NEGATIVE

## 2018-10-15 ENCOUNTER — Other Ambulatory Visit: Payer: Medicare Other

## 2018-10-15 DIAGNOSIS — N3000 Acute cystitis without hematuria: Secondary | ICD-10-CM

## 2018-10-15 LAB — MICROSCOPIC EXAMINATION
Bacteria, UA: NONE SEEN
RBC MICROSCOPIC, UA: NONE SEEN /HPF (ref 0–2)

## 2018-10-15 LAB — URINALYSIS, COMPLETE
Bilirubin, UA: NEGATIVE
GLUCOSE, UA: NEGATIVE
Ketones, UA: NEGATIVE
Leukocytes, UA: NEGATIVE
Nitrite, UA: NEGATIVE
PH UA: 7 (ref 5.0–7.5)
Protein, UA: NEGATIVE
RBC, UA: NEGATIVE
Specific Gravity, UA: 1.015 (ref 1.005–1.030)
Urobilinogen, Ur: 0.2 mg/dL (ref 0.2–1.0)

## 2018-11-04 ENCOUNTER — Other Ambulatory Visit: Payer: Self-pay | Admitting: Family Medicine

## 2018-11-05 NOTE — Telephone Encounter (Signed)
Last seen 12/13/17

## 2018-11-12 ENCOUNTER — Other Ambulatory Visit: Payer: Self-pay | Admitting: Family Medicine

## 2019-01-24 ENCOUNTER — Telehealth: Payer: Self-pay | Admitting: *Deleted

## 2019-01-24 NOTE — Telephone Encounter (Signed)
   Primary Cardiologist:  Rozann Lesches, MD   Patient contacted.  History reviewed.  No symptoms to suggest any unstable cardiac conditions.  Based on discussion, with current pandemic situation, we will be postponing this appointment for Brianna [name] with a plan for f/u in June 2020 or sooner if feasible/necessary.  If symptoms change, she has been instructed to contact our office.    Marlou Sa, RN  01/24/2019 9:16 AM         .

## 2019-01-30 ENCOUNTER — Ambulatory Visit: Payer: Medicare Other | Admitting: Cardiology

## 2019-01-31 ENCOUNTER — Other Ambulatory Visit: Payer: Self-pay | Admitting: Family Medicine

## 2019-04-07 ENCOUNTER — Other Ambulatory Visit: Payer: Self-pay | Admitting: Family Medicine

## 2019-04-09 ENCOUNTER — Telehealth: Payer: Self-pay | Admitting: Cardiology

## 2019-04-09 ENCOUNTER — Encounter: Payer: Self-pay | Admitting: Family Medicine

## 2019-04-09 ENCOUNTER — Ambulatory Visit (INDEPENDENT_AMBULATORY_CARE_PROVIDER_SITE_OTHER): Payer: Medicare Other | Admitting: Family Medicine

## 2019-04-09 DIAGNOSIS — I739 Peripheral vascular disease, unspecified: Secondary | ICD-10-CM

## 2019-04-09 DIAGNOSIS — E7800 Pure hypercholesterolemia, unspecified: Secondary | ICD-10-CM

## 2019-04-09 DIAGNOSIS — C189 Malignant neoplasm of colon, unspecified: Secondary | ICD-10-CM

## 2019-04-09 DIAGNOSIS — E039 Hypothyroidism, unspecified: Secondary | ICD-10-CM | POA: Diagnosis not present

## 2019-04-09 DIAGNOSIS — I10 Essential (primary) hypertension: Secondary | ICD-10-CM

## 2019-04-09 DIAGNOSIS — I700 Atherosclerosis of aorta: Secondary | ICD-10-CM | POA: Diagnosis not present

## 2019-04-09 DIAGNOSIS — D692 Other nonthrombocytopenic purpura: Secondary | ICD-10-CM

## 2019-04-09 DIAGNOSIS — E559 Vitamin D deficiency, unspecified: Secondary | ICD-10-CM

## 2019-04-09 DIAGNOSIS — J410 Simple chronic bronchitis: Secondary | ICD-10-CM

## 2019-04-09 DIAGNOSIS — G8929 Other chronic pain: Secondary | ICD-10-CM

## 2019-04-09 DIAGNOSIS — M25561 Pain in right knee: Secondary | ICD-10-CM

## 2019-04-09 NOTE — Addendum Note (Signed)
Addended by: Zannie Cove on: 04/09/2019 03:10 PM   Modules accepted: Orders

## 2019-04-09 NOTE — Telephone Encounter (Signed)
Virtual Visit Pre-Appointment Phone Call  "(Name), I am calling you today to discuss your upcoming appointment. We are currently trying to limit exposure to the virus that causes COVID-19 by seeing patients at home rather than in the office."  1. "What is the BEST phone number to call the day of the visit?" - [phone]  2. Do you have or have access to (through a family member/friend) a smartphone with video capability that we can use for your visit?" a. If yes - list this number in appt notes as cell (if different from BEST phone #) and list the appointment type as a VIDEO visit in appointment notes b. If no - list the appointment type as a PHONE visit in appointment notes  3. Confirm consent - "In the setting of the current Covid19 crisis, you are scheduled for a (phone or video) visit with your provider on (date) at (time).  Just as we do with many in-office visits, in order for you to participate in this visit, we must obtain consent.  If you'd like, I can send this to your mychart (if signed up) or email for you to review.  Otherwise, I can obtain your verbal consent now.  All virtual visits are billed to your insurance company just like a normal visit would be.  By agreeing to a virtual visit, we'd like you to understand that the technology does not allow for your provider to perform an examination, and thus may limit your provider's ability to fully assess your condition. If your provider identifies any concerns that need to be evaluated in person, we will make arrangements to do so.  Finally, though the technology is pretty good, we cannot assure that it will always work on either your or our end, and in the setting of a video visit, we may have to convert it to a phone-only visit.  In either situation, we cannot ensure that we have a secure connection.  Are you willing to proceed?" STAFF: Did the patient verbally acknowledge consent to telehealth visit? Document YES/NO here: yes    4. Advise patient to be prepared - "Two hours prior to your appointment, go ahead and check your blood pressure, pulse, oxygen saturation, and your weight (if you have the equipment to check those) and write them all down. When your visit starts, your provider will ask you for this information. If you have an Apple Watch or Kardia device, please plan to have heart rate information ready on the day of your appointment. Please have a pen and paper handy nearby the day of the visit as well."  5. Give patient instructions for MyChart download to smartphone OR Doximity/Doxy.me as below if video visit (depending on what platform provider is using)  6. Inform patient they will receive a phone call 15 minutes prior to their appointment time (may be from unknown caller ID) so they should be prepared to answer    TELEPHONE CALL NOTE  Brianna [name] has been deemed a candidate for a follow-up tele-health visit to limit community exposure during the Covid-19 pandemic. I spoke with the patient via phone to ensure availability of phone/video source, confirm preferred email & phone number, and discuss instructions and expectations.  I reminded Brianna [name] to be prepared with any vital sign and/or heart rhythm information that could potentially be obtained via home monitoring, at the time of her visit. I reminded Brianna [name] to expect a phone call prior to her visit.  Chanda Busing 04/09/2019 10:37 AM   INSTRUCTIONS FOR DOWNLOADING THE MYCHART APP TO SMARTPHONE  - The patient must first make sure to have activated MyChart and know their login information - If Apple, go to [employer] and type in MyChart in the search bar and download the app. If Android, ask patient to go to [employer] and type in [place] [facility] in the search bar and download the app. The app is free but as with any other app downloads, their phone may require them to verify saved payment information or Apple/Android password.  -  The patient will need to then log into the app with their MyChart username and password, and select [facility] as their healthcare provider to link the account. When it is time for your visit, go to the MyChart app, find appointments, and click Begin Video Visit. Be sure to Select Allow for your device to access the Microphone and Camera for your visit. You will then be connected, and your provider will be with you shortly.  **If they have any issues connecting, or need assistance please contact MyChart service desk [phone])**  **If using a computer, in order to ensure the best quality for their visit they will need to use either of the following Internet Browsers: [employer], or Google Chrome**  IF USING DOXIMITY or DOXY.ME - The patient will receive a link just prior to their visit by text.     FULL LENGTH CONSENT FOR TELE-HEALTH VISIT   I hereby voluntarily request, consent and authorize [facility] and its employed or contracted physicians, physician assistants, nurse practitioners or other licensed health care professionals (the Practitioner), to provide me with telemedicine health care services (the Services") as deemed necessary by the treating Practitioner. I acknowledge and consent to receive the Services by the Practitioner via telemedicine. I understand that the telemedicine visit will involve communicating with the Practitioner through live audiovisual communication technology and the disclosure of certain medical information by electronic transmission. I acknowledge that I have been given the opportunity to request an in-person assessment or other available alternative prior to the telemedicine visit and am voluntarily participating in the telemedicine visit.  I understand that I have the right to withhold or withdraw my consent to the use of telemedicine in the course of my care at any time, without affecting my right to future care or treatment, and that the  Practitioner or I may terminate the telemedicine visit at any time. I understand that I have the right to inspect all information obtained and/or recorded in the course of the telemedicine visit and may receive copies of available information for a reasonable fee.  I understand that some of the potential risks of receiving the Services via telemedicine include:   Delay or interruption in medical evaluation due to technological equipment failure or disruption;  Information transmitted may not be sufficient (e.g. poor resolution of images) to allow for appropriate medical decision making by the Practitioner; and/or   In rare instances, security protocols could fail, causing a breach of personal health information.  Furthermore, I acknowledge that it is my responsibility to provide information about my medical history, conditions and care that is complete and accurate to the best of my ability. I acknowledge that Practitioner's advice, recommendations, and/or decision may be based on factors not within their control, such as incomplete or inaccurate data provided by me or distortions of diagnostic images or specimens that may result from electronic transmissions. I understand that the  practice of medicine is not an Chief Strategy Officer and that Practitioner makes no warranties or guarantees regarding treatment outcomes. I acknowledge that I will receive a copy of this consent concurrently upon execution via email to the email address I last provided but may also request a printed copy by calling the office of [facility].    I understand that my insurance will be billed for this visit.   I have read or had this consent read to me.  I understand the contents of this consent, which adequately explains the benefits and risks of the Services being provided via telemedicine.   I have been provided ample opportunity to ask questions regarding this consent and the Services and have had my questions answered to my  satisfaction.  I give my informed consent for the services to be provided through the use of telemedicine in my medical care  By participating in this telemedicine visit I agree to the above.

## 2019-04-09 NOTE — Patient Instructions (Signed)
Follow-up with cardiology as planned Arrange visit with orthopedic surgeon when they come back to [facility] for repeat injections to right knee Continue to practice good hand and respiratory hygiene Diminish sodium intake Consider wearing support hose that would be put on both legs early in the morning with arising from the bed Lay down on a flat surface with legs elevated for about 30 minutes every afternoon Continue with current medicines Come to the office for blood work and we will call patient with results

## 2019-04-09 NOTE — Progress Notes (Signed)
Virtual Visit Via telephone Note I connected with@ on 04/09/19 by telephone and verified that I am speaking with the correct person or authorized healthcare agent using two identifiers. Brianna [name] is currently located at home and there are no unauthorized people in close proximity. I completed this visit while in a private location in my home .  This visit type was conducted due to national recommendations for restrictions regarding the COVID-19 Pandemic (e.g. social distancing).  This format is felt to be most appropriate for this patient at this time.  All issues noted in this document were discussed and addressed.  No physical exam was performed.    I discussed the limitations, risks, security and privacy concerns of performing an evaluation and management service by telephone and the availability of in person appointments. I also discussed with the patient that there may be a patient responsible charge related to this service. The patient expressed understanding and agreed to proceed.   Date:  04/09/2019    ID:  Brianna [name]      03/05/1925        [id number]   Patient Care Team Patient Care Team: Chipper Herb, MD as PCP - General (Family Medicine) Satira Sark, MD as PCP - Cardiology (Cardiology) Paralee Cancel, MD as Attending Physician (Orthopedic Surgery)  Reason for Visit: Primary Care Follow-up     History of Present Illness & Review of Systems:     Brianna [name] is a 85 y.o. year old female primary care patient that presents today for a telehealth visit.  Patient is pleasant and alert though slow to respond to questions asked of her sounds like she is a little bit hearing impaired.  Her daughter was present during the conversations and will follow through and follow-up on things that are needed for Brianna [name].  She does have an upcoming appointment at some point in the future with her cardiologist Dr. Domenic Polite.  Uses a cane around the house and I spoke with the  daughter about the possibility of a walker because of ongoing right knee pain.  We will schedule her to see the orthopedic surgeon, Dr. Reynaldo Minium when he comes back to [facility] to get another injection in her knee.  Today she denies any chest pain pressure tightness or shortness of breath.  She does not have any PND.  She does have to pee a lot at nighttime.  We did suggest watching sodium intake and maybe trying some support hose and continue to lay down for about 20 minutes or 30 minutes during the day and elevating her feet.  She does have nocturia but no other symptoms with this.  Her biggest complaint is her right knee pain so we will arrange an appointment with orthopedic surgeon.  She has not had any falls.  She will come to the office for blood work.  Review of systems as stated, otherwise negative.  The patient does not have symptoms concerning for COVID-19 infection (fever, chills, cough, or new shortness of breath).      Current Medications (Verified) Allergies as of 04/09/2019      Reactions   Alendronate Sodium Nausea Only   Lodine [etodolac]    unknown   Risedronate Sodium    unknown      Medication List       Accurate as of April 09, 2019  9:35 AM. If you have any questions, ask your nurse or doctor.  aspirin 81 MG tablet Take 81 mg by mouth 3 (three) times a week.   atorvastatin 10 MG tablet Commonly known as:  LIPITOR TAKE 1/2 TABLET AT BEDTIME   beta carotene w/minerals tablet Take 1 tablet by mouth daily.   CITRACAL + D PO Take 2 tablets by mouth daily.   FISH OIL PO Take 1 tablet by mouth daily.   hydrochlorothiazide 12.5 MG capsule Commonly known as:  MICROZIDE TAKE ONE (1) CAPSULE EACH DAY   levothyroxine 25 MCG tablet Commonly known as:  SYNTHROID TAKE 2 TABLETS MONDAY-FRIDAY AND TAKE 1 TABLET SATURDAY & SUNDAY   losartan 100 MG tablet Commonly known as:  COZAAR TAKE ONE (1) TABLET EACH DAY   Vitamin D3 50 MCG (2000 UT) Tabs Take 1 tablet by  mouth daily.           Allergies (Verified)    Alendronate sodium; Lodine [etodolac]; and Risedronate sodium  Past Medical History Past Medical History:  Diagnosis Date  . Bilateral inguinal hernia (BIH)   . Carpal tunnel syndrome on right   . Colon polyp   . COPD (chronic obstructive pulmonary disease) ([place])   . Essential hypertension, benign   . Fibrocystic breast   . Frequency of urination   . History of skin cancer   . Hypothyroidism   . Lumbosacral spondylosis without myelopathy   . Mixed hyperlipidemia   . Myxomatous mitral valve    Mild prolapse with eccentric, moderate to severe regurgitation  . Obturator hernia, right   . Osteoporosis   . PONV (postoperative nausea and vomiting)   . 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 Symptomatic menopausal or female climacteric states   . Ventral hernia      Past Surgical History:  Procedure Laterality Date  . APPENDECTOMY    . CATARACT EXTRACTION W/ INTRAOCULAR LENS  IMPLANT, BILATERAL    . CESAREAN SECTION    . COLON SURGERY  2004   8'04-open LAR for rectosigmoid cancer T1N0 within polyp  . INGUINAL HERNIA REPAIR  11/21/2011   Procedure: LAPAROSCOPIC BILATERAL INGUINAL HERNIA REPAIR;  Surgeon: Adin Hector, MD;  Location: [facility];  Service: General;  Laterality: N/A;  . LAPAROSCOPY  11/21/2011   R obturator hernia repair  . TONSILLECTOMY    . TOTAL HIP ARTHROPLASTY Right 03/04/2013   Procedure: RIGHT TOTAL HIP ARTHROPLASTY ANTERIOR APPROACH;  Surgeon: Mauri Pole, MD;  Location: [facility];  Service: Orthopedics;  Laterality: Right;  . VENTRAL HERNIA REPAIR  11/21/2011   Procedure: LAPAROSCOPIC VENTRAL HERNIA;  Surgeon: Adin Hector, MD;  Location: [facility];  Service: General;  Laterality: N/A;  laparoscopic bilateral inguinal hernia repair with mesh and ventral hernia repair with mesh    Social History   Socioeconomic History  . Marital status: Widowed    Spouse name: Not on file  . Number of children: Not on  file  . Years of education: Not on file  . Highest education level: Not on file  Occupational History  . Not on file  Social Needs  . Financial resource strain: Not on file  . Food insecurity:    Worry: Not on file    Inability: Not on file  . Transportation needs:    Medical: Not on file    Non-medical: Not on file  Tobacco Use  . Smoking status: Never Smoker  . Smokeless tobacco: Never Used  Substance and Sexual Activity  . Alcohol use: No    Alcohol/week: 0.0  standard drinks  . Drug use: No  . Sexual activity: Never  Lifestyle  . Physical activity:    Days per week: Not on file    Minutes per session: Not on file  . Stress: Not on file  Relationships  . Social connections:    Talks on phone: Not on file    Gets together: Not on file    Attends religious service: Not on file    Active member of club or organization: Not on file    Attends meetings of clubs or organizations: Not on file    Relationship status: Not on file  Other Topics Concern  . Not on file  Social History Narrative  . Not on file     Family History  Problem Relation Age of Onset  . Heart disease Mother   . Heart disease Father   . Lung cancer Brother       Labs/Other Tests and Data Reviewed:    Wt Readings from Last 3 Encounters:  10/02/18 103 lb 3.2 oz (46.8 kg)  08/02/18 108 lb (49 kg)  04/02/18 106 lb (48.1 kg)   Temp Readings from Last 3 Encounters:  10/02/18 97.9 F (36.6 C)  04/02/18 (!) 97 F (36.1 C) (Oral)  10/03/17 (!) 96.8 F (36 C) (Oral)   BP Readings from Last 3 Encounters:  10/02/18 (!) 160/81  08/02/18 (!) 160/80  04/02/18 (!) 158/75   Pulse Readings from Last 3 Encounters:  10/02/18 76  08/02/18 82  04/02/18 72     No results found for: HGBA1C Lab Results  Component Value Date   LDLCALC 84 10/02/2018   CREATININE 0.83 10/02/2018       Chemistry      Component Value Date/Time   NA 134 10/02/2018 1133   K 4.6 10/02/2018 1133   CL 92 (L)  10/02/2018 1133   CO2 23 10/02/2018 1133   BUN 15 10/02/2018 1133   CREATININE 0.83 10/02/2018 1133   CREATININE 0.88 03/25/2013 1255      Component Value Date/Time   CALCIUM 10.3 10/02/2018 1133   ALKPHOS 64 10/02/2018 1133   AST 24 10/02/2018 1133   ALT 15 10/02/2018 1133   BILITOT 0.4 10/02/2018 1133         OBSERVATIONS/ OBJECTIVE:     The patient did not have any blood pressure readings to record but we did talk with the daughter about getting several readings each month so that these could be reported to the doctors that are following her.  She will also plan to get some weights.  She has not been running any fever or had any respiratory symptoms.  She is stayed pretty much confined to her home.  She would like to see the orthopedic surgeon again and receive an injection in her knee as soon as he comes back to the office in [place].  Physical exam deferred due to nature of telephonic visit.  ASSESSMENT & PLAN    Time:   Today, I have spent 30 minutes with the patient via telephone discussing the above including Covid precautions.     Visit Diagnoses: There are no diagnoses linked to this encounter.  Patient Instructions  Follow-up with cardiology as planned Arrange visit with orthopedic surgeon when they come back to [facility] for repeat injections to right knee Continue to practice good hand and respiratory hygiene Diminish sodium intake Consider wearing support hose that would be put on both legs early in the morning with arising from the  bed Lay down on a flat surface with legs elevated for about 30 minutes every afternoon Continue with current medicines Come to the office for blood work and we will call patient with results     The above assessment and management plan was discussed with the patient. The patient verbalized understanding of and has agreed to the management plan. Patient is aware to call the clinic if symptoms persist or worsen. Patient is aware when  to return to the clinic for a follow-up visit. Patient educated on when it is appropriate to go to the emergency department.    Chipper Herb, MD [address], [place], [place] [address] Ph [phone]   Arrie Senate MD

## 2019-04-10 ENCOUNTER — Telehealth: Payer: Self-pay | Admitting: Family Medicine

## 2019-04-10 NOTE — Telephone Encounter (Signed)
Pt daughter aware to come for lab

## 2019-04-11 ENCOUNTER — Other Ambulatory Visit: Payer: Medicare Other

## 2019-04-11 ENCOUNTER — Other Ambulatory Visit: Payer: Self-pay

## 2019-04-11 DIAGNOSIS — I739 Peripheral vascular disease, unspecified: Secondary | ICD-10-CM

## 2019-04-11 DIAGNOSIS — G8929 Other chronic pain: Secondary | ICD-10-CM

## 2019-04-11 DIAGNOSIS — C189 Malignant neoplasm of colon, unspecified: Secondary | ICD-10-CM

## 2019-04-11 DIAGNOSIS — E7800 Pure hypercholesterolemia, unspecified: Secondary | ICD-10-CM

## 2019-04-11 DIAGNOSIS — I10 Essential (primary) hypertension: Secondary | ICD-10-CM

## 2019-04-11 DIAGNOSIS — J410 Simple chronic bronchitis: Secondary | ICD-10-CM

## 2019-04-11 DIAGNOSIS — E559 Vitamin D deficiency, unspecified: Secondary | ICD-10-CM | POA: Diagnosis not present

## 2019-04-11 DIAGNOSIS — D692 Other nonthrombocytopenic purpura: Secondary | ICD-10-CM

## 2019-04-11 DIAGNOSIS — E039 Hypothyroidism, unspecified: Secondary | ICD-10-CM

## 2019-04-11 DIAGNOSIS — I700 Atherosclerosis of aorta: Secondary | ICD-10-CM | POA: Diagnosis not present

## 2019-04-12 LAB — LIPID PANEL
Chol/HDL Ratio: 1.9 ratio (ref 0.0–4.4)
Cholesterol, Total: 175 mg/dL (ref 100–199)
HDL: 92 mg/dL (ref >39)
LDL Calculated: 76 mg/dL (ref 0–99)
Triglycerides: 36 mg/dL (ref 0–149)
VLDL Cholesterol Cal: 7 mg/dL (ref 5–40)

## 2019-04-12 LAB — BMP8+EGFR
BUN/Creatinine Ratio: 20 (ref 12–28)
BUN: 20 mg/dL (ref 10–36)
CO2: 25 mmol/L (ref 20–29)
Calcium: 10 mg/dL (ref 8.7–10.3)
Chloride: 98 mmol/L (ref 96–106)
Creatinine, Ser: 0.99 mg/dL (ref 0.57–1.00)
GFR calc Af Amer: 57 mL/min/{1.73_m2} — ABNORMAL LOW (ref >59)
GFR calc non Af Amer: 50 mL/min/{1.73_m2} — ABNORMAL LOW (ref >59)
Glucose: 98 mg/dL (ref 65–99)
Potassium: 4.7 mmol/L (ref 3.5–5.2)
Sodium: 137 mmol/L (ref 134–144)

## 2019-04-12 LAB — CBC WITH DIFFERENTIAL/PLATELET
Basophils Absolute: 0 10*3/uL (ref 0.0–0.2)
Basos: 1 %
EOS (ABSOLUTE): 0.1 10*3/uL (ref 0.0–0.4)
Eos: 1 %
Hematocrit: 35.5 % (ref 34.0–46.6)
Hemoglobin: 12.1 g/dL (ref 11.1–15.9)
Immature Grans (Abs): 0 10*3/uL (ref 0.0–0.1)
Immature Granulocytes: 0 %
Lymphocytes Absolute: 1.2 10*3/uL (ref 0.7–3.1)
Lymphs: 24 %
MCH: 31.7 pg (ref 26.6–33.0)
MCHC: 34.1 g/dL (ref 31.5–35.7)
MCV: 93 fL (ref 79–97)
Monocytes Absolute: 0.7 10*3/uL (ref 0.1–0.9)
Monocytes: 15 %
Neutrophils Absolute: 3 10*3/uL (ref 1.4–7.0)
Neutrophils: 59 %
Platelets: 202 10*3/uL (ref 150–450)
RBC: 3.82 x10E6/uL (ref 3.77–5.28)
RDW: 13 % (ref 11.7–15.4)
WBC: 5 10*3/uL (ref 3.4–10.8)

## 2019-04-12 LAB — HEPATIC FUNCTION PANEL
ALT: 16 IU/L (ref 0–32)
AST: 25 IU/L (ref 0–40)
Albumin: 4.5 g/dL (ref 3.5–4.6)
Alkaline Phosphatase: 69 IU/L (ref 39–117)
Bilirubin Total: 0.5 mg/dL (ref 0.0–1.2)
Bilirubin, Direct: 0.17 mg/dL (ref 0.00–0.40)
Total Protein: 6.9 g/dL (ref 6.0–8.5)

## 2019-04-12 LAB — VITAMIN D 25 HYDROXY (VIT D DEFICIENCY, FRACTURES): Vit D, 25-Hydroxy: 60.4 ng/mL (ref 30.0–100.0)

## 2019-04-14 NOTE — Progress Notes (Signed)
Virtual Visit via Telephone Note   This visit type was conducted due to national recommendations for restrictions regarding the COVID-19 Pandemic (e.g. social distancing) in an effort to limit this patient's exposure and mitigate transmission in our community.  Due to her co-morbid illnesses, this patient is at least at moderate risk for complications without adequate follow up.  This format is felt to be most appropriate for this patient at this time.  The patient did not have access to video technology/had technical difficulties with video requiring transitioning to audio format only (telephone).  All issues noted in this document were discussed and addressed.  No physical exam could be performed with this format.  Please refer to the patient's chart for her  consent to telehealth for [facility].   Date:  04/15/2019   ID:  Brianna [name], DOB 02-Jan-1925, MRN [id number]  Patient Location: Home Provider Location: Office  PCP:  Brianna Fraise, MD  Cardiologist:  Brianna Lesches, MD Electrophysiologist:  None   Evaluation Performed:  New Patient Evaluation  Chief Complaint:   Cardiac follow-up  History of Present Illness:    Brianna [name] is a 85 y.o. female last seen in October 2019.  She did not have video access and we spoke by phone today.  I also talked with her daughter.  Her daughter states that she has had some progressive episodes of confusion.  Otherwise, no specific increasing breathlessness.  [name] states that she has been eating well, denies any recent falls.  I reviewed her medications which are listed below.  Blood pressure recorded was from yesterday.  This morning's blood pressure was higher.  Recent telehealth visit noted with Dr. Laurance [name], I reviewed the note.  The patient does not have symptoms concerning for COVID-19 infection (fever, chills, cough, or new shortness of breath).    Past Medical History:  Diagnosis Date  . Bilateral inguinal hernia (BIH)    . Carpal tunnel syndrome on right   . Colon polyp   . COPD (chronic obstructive pulmonary disease) ([place])   . Essential hypertension, benign   . Fibrocystic breast   . Frequency of urination   . History of skin cancer   . Hypothyroidism   . Lumbosacral spondylosis without myelopathy   . Mixed hyperlipidemia   . Myxomatous mitral valve    Mild prolapse with eccentric, moderate to severe regurgitation  . Obturator hernia, right   . Osteoporosis   . PONV (postoperative nausea and vomiting)   . 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 Symptomatic menopausal or female climacteric states   . Ventral hernia    Past Surgical History:  Procedure Laterality Date  . APPENDECTOMY    . CATARACT EXTRACTION W/ INTRAOCULAR LENS  IMPLANT, BILATERAL    . CESAREAN SECTION    . COLON SURGERY  2004   8'04-open LAR for rectosigmoid cancer T1N0 within polyp  . INGUINAL HERNIA REPAIR  11/21/2011   Procedure: LAPAROSCOPIC BILATERAL INGUINAL HERNIA REPAIR;  Surgeon: Brianna Hector, MD;  Location: [facility];  Service: General;  Laterality: N/A;  . LAPAROSCOPY  11/21/2011   R obturator hernia repair  . TONSILLECTOMY    . TOTAL HIP ARTHROPLASTY Right 03/04/2013   Procedure: RIGHT TOTAL HIP ARTHROPLASTY ANTERIOR APPROACH;  Surgeon: Brianna Pole, MD;  Location: [facility];  Service: Orthopedics;  Laterality: Right;  . VENTRAL HERNIA REPAIR  11/21/2011   Procedure: LAPAROSCOPIC VENTRAL HERNIA;  Surgeon: Brianna Hector, MD;  Location: [facility];  Service: General;  Laterality: N/A;  laparoscopic bilateral inguinal hernia repair with mesh and ventral hernia repair with mesh     Current Meds  Medication Sig  . aspirin 81 MG tablet Take 81 mg by mouth 3 (three) times a week.  Marland Kitchen atorvastatin (LIPITOR) 10 MG tablet TAKE 1/2 TABLET AT BEDTIME  . beta carotene w/minerals (OCUVITE) tablet Take 1 tablet by mouth daily.    . Calcium Citrate-Vitamin D (CITRACAL + D PO) Take 2 tablets by mouth daily.   . Cholecalciferol  (VITAMIN D3) 2000 UNITS TABS Take 1 tablet by mouth daily.   . hydrochlorothiazide (MICROZIDE) 12.5 MG capsule TAKE ONE (1) CAPSULE EACH DAY  . levothyroxine (SYNTHROID) 25 MCG tablet TAKE 2 TABLETS MONDAY-FRIDAY AND TAKE 1 TABLET SATURDAY & SUNDAY  . losartan (COZAAR) 100 MG tablet TAKE ONE (1) TABLET EACH DAY  . Omega-3 Fatty Acids (FISH OIL PO) Take 1 tablet by mouth daily.     Allergies:   Alendronate sodium, Lodine [etodolac], and Risedronate sodium   Social History   Tobacco Use  . Smoking status: Never Smoker  . Smokeless tobacco: Never Used  Substance Use Topics  . Alcohol use: No    Alcohol/week: 0.0 standard drinks  . Drug use: No     Family Hx: The patient's family history includes Heart disease in her father and mother; Lung cancer in her brother.  ROS:   Please see the history of present illness. Hearing loss. All other systems reviewed and are negative.   Prior CV studies:   The following studies were reviewed today:  Echocardiogram 02/20/2013: Study Conclusions  - Left ventricle: The cavity size was normal. Wall thickness was increased in a pattern of mild LVH. There was mild concentric hypertrophy. Systolic function was normal. The estimated ejection fraction was in the range of 60% to 65%. Wall motion was normal; there were no regional wall motion abnormalities. Doppler parameters are consistent with abnormal left ventricular relaxation (grade 1 diastolic dysfunction). - Aortic valve: Trileaflet; mildly thickened, mildly calcified leaflets. Mild regurgitation. Mean gradient: 71mm Hg (S). Valve area: 2.4cm^2(VTI). - Mitral valve: Mildly to moderately thickened leaflets - appear myxomatous. Mild prolapse, involving the anterior leaflet and the posterior leaflet. Moderate to severe regurgitation directed eccentrically. Unable to quantify more objectively based on limited interrogation. - Left atrium: The atrium was moderately  dilated. - Right atrium: The atrium was mildly dilated. Central venous pressure: 37mm Hg (est). - Tricuspid valve: Mild regurgitation. - Pulmonic valve: Mild regurgitation directed eccentrically. - Pulmonary arteries: PA peak pressure: 2mm Hg (S). PA pressure: 77mm Hg (ED). - Pericardium, extracardiac: There was no pericardial effusion.  Labs/Other Tests and Data Reviewed:    EKG:  An ECG dated 08/02/2018 was personally reviewed today and demonstrated:  Sinus rhythm.  Recent Labs: 04/11/2019: ALT 16; BUN 20; Creatinine, Ser 0.99; Hemoglobin 12.1; Platelets 202; Potassium 4.7; Sodium 137   Recent Lipid Panel Lab Results  Component Value Date/Time   CHOL 175 04/11/2019 09:31 AM   TRIG 36 04/11/2019 09:31 AM   TRIG 62 08/24/2014 12:30 PM   HDL 92 04/11/2019 09:31 AM   HDL 72 08/24/2014 12:30 PM   CHOLHDL 1.9 04/11/2019 09:31 AM   CHOLHDL 2.7 03/25/2013 12:55 PM   LDLCALC 76 04/11/2019 09:31 AM   LDLCALC 79 08/21/2013 08:38 AM    Wt Readings from Last 3 Encounters:  04/15/19 104 lb (47.2 kg)  10/02/18 103 lb 3.2 oz (46.8 kg)  08/02/18 108 lb (49  kg)     Objective:    Vital Signs:  BP 130/65   Pulse 71   Ht 5' (1.524 m)   Wt 104 lb (47.2 kg)   BMI 20.31 kg/m    Patient spoke in full sentences on the phone, not short of breath. No audible wheezing or coughing.  ASSESSMENT & PLAN:    1.  Mitral valve prolapse with moderate to severe eccentric mitral regurgitation based on previous imaging studies.  We are following her conservatively.  Weight is stable and she does not indicate any progressive shortness of breath.  2.  Essential hypertension, continue with Cozaar and HCTZ.  COVID-19 Education: The signs and symptoms of COVID-19 were discussed with the patient and how to seek care for testing (follow up with PCP or arrange E-visit).  The importance of social distancing was discussed today.  Time:   Today, I have spent 7 minutes with the patient with telehealth  technology discussing the above problems.     Medication Adjustments/Labs and Tests Ordered: Current medicines are reviewed at length with the patient today.  Concerns regarding medicines are outlined above.   Tests Ordered: No orders of the defined types were placed in this encounter.   Medication Changes: No orders of the defined types were placed in this encounter.   Follow Up:  In Person 6 months in the [place] office.  Signed, Brianna Lesches, MD  04/15/2019 10:02 AM    [place]

## 2019-04-15 ENCOUNTER — Encounter: Payer: Self-pay | Admitting: Cardiology

## 2019-04-15 ENCOUNTER — Telehealth (INDEPENDENT_AMBULATORY_CARE_PROVIDER_SITE_OTHER): Payer: Medicare Other | Admitting: Cardiology

## 2019-04-15 VITALS — BP 130/65 | HR 71 | Ht 60.0 in | Wt 104.0 lb

## 2019-04-15 DIAGNOSIS — Z7189 Other specified counseling: Secondary | ICD-10-CM

## 2019-04-15 DIAGNOSIS — I059 Rheumatic mitral valve disease, unspecified: Secondary | ICD-10-CM | POA: Diagnosis not present

## 2019-04-15 DIAGNOSIS — I10 Essential (primary) hypertension: Secondary | ICD-10-CM

## 2019-04-15 NOTE — Patient Instructions (Addendum)

## 2019-04-16 ENCOUNTER — Other Ambulatory Visit: Payer: Self-pay | Admitting: Cardiology

## 2019-05-02 ENCOUNTER — Other Ambulatory Visit: Payer: Self-pay | Admitting: Family Medicine

## 2019-05-06 DIAGNOSIS — L97521 Non-pressure chronic ulcer of other part of left foot limited to breakdown of skin: Secondary | ICD-10-CM | POA: Diagnosis not present

## 2019-05-15 DIAGNOSIS — L97511 Non-pressure chronic ulcer of other part of right foot limited to breakdown of skin: Secondary | ICD-10-CM | POA: Diagnosis not present

## 2019-05-23 ENCOUNTER — Other Ambulatory Visit: Payer: Self-pay | Admitting: Family Medicine

## 2019-06-26 DIAGNOSIS — L84 Corns and callosities: Secondary | ICD-10-CM | POA: Diagnosis not present

## 2019-06-26 DIAGNOSIS — M79673 Pain in unspecified foot: Secondary | ICD-10-CM | POA: Diagnosis not present

## 2019-06-26 DIAGNOSIS — I70203 Unspecified atherosclerosis of native arteries of extremities, bilateral legs: Secondary | ICD-10-CM | POA: Diagnosis not present

## 2019-07-31 ENCOUNTER — Other Ambulatory Visit: Payer: Self-pay | Admitting: Family Medicine

## 2019-08-12 ENCOUNTER — Telehealth: Payer: Self-pay | Admitting: Family Medicine

## 2019-08-13 ENCOUNTER — Ambulatory Visit (INDEPENDENT_AMBULATORY_CARE_PROVIDER_SITE_OTHER): Payer: Medicare Other | Admitting: Family Medicine

## 2019-08-13 ENCOUNTER — Telehealth: Payer: Self-pay | Admitting: *Deleted

## 2019-08-13 ENCOUNTER — Other Ambulatory Visit: Payer: Self-pay

## 2019-08-13 ENCOUNTER — Encounter: Payer: Self-pay | Admitting: Family Medicine

## 2019-08-13 VITALS — BP 160/74 | HR 89 | Temp 97.8°F | Resp 18 | Ht 60.0 in | Wt 95.0 lb

## 2019-08-13 DIAGNOSIS — D692 Other nonthrombocytopenic purpura: Secondary | ICD-10-CM

## 2019-08-13 DIAGNOSIS — Z23 Encounter for immunization: Secondary | ICD-10-CM

## 2019-08-13 DIAGNOSIS — E039 Hypothyroidism, unspecified: Secondary | ICD-10-CM | POA: Diagnosis not present

## 2019-08-13 DIAGNOSIS — M1990 Unspecified osteoarthritis, unspecified site: Secondary | ICD-10-CM | POA: Diagnosis not present

## 2019-08-13 DIAGNOSIS — M810 Age-related osteoporosis without current pathological fracture: Secondary | ICD-10-CM

## 2019-08-13 DIAGNOSIS — E7800 Pure hypercholesterolemia, unspecified: Secondary | ICD-10-CM | POA: Diagnosis not present

## 2019-08-13 DIAGNOSIS — J410 Simple chronic bronchitis: Secondary | ICD-10-CM

## 2019-08-13 DIAGNOSIS — I10 Essential (primary) hypertension: Secondary | ICD-10-CM | POA: Diagnosis not present

## 2019-08-13 MED ORDER — LOSARTAN POTASSIUM 100 MG PO TABS: ORAL_TABLET | ORAL | 0 refills | Status: DC

## 2019-08-13 MED ORDER — ATORVASTATIN CALCIUM 10 MG PO TABS: 5.0000 mg | ORAL_TABLET | Freq: Every day | ORAL | 1 refills | Status: DC

## 2019-08-13 MED ORDER — HYDROCHLOROTHIAZIDE 12.5 MG PO CAPS: ORAL_CAPSULE | ORAL | 1 refills | Status: DC

## 2019-08-13 MED ORDER — LEVOTHYROXINE SODIUM 25 MCG PO TABS: ORAL_TABLET | ORAL | 1 refills | Status: DC

## 2019-08-13 MED ORDER — DICLOFENAC SODIUM 1 % TD GEL: 4.0000 g | Freq: Four times a day (QID) | TRANSDERMAL | 5 refills | Status: DC

## 2019-08-13 MED ORDER — AMLODIPINE BESYLATE 2.5 MG PO TABS: 2.5000 mg | ORAL_TABLET | Freq: Every day | ORAL | 1 refills | Status: DC

## 2019-08-13 NOTE — Progress Notes (Signed)
Subjective:  Patient ID: Brianna [name], female    DOB: Nov 28, 1924  Age: 85 y.o. 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  Patient presents for follow-up on  thyroid. The patient has a history of hypothyroidism for many years. It has been stable recently. Pt. denies any change in  voice, loss of hair, heat or cold intolerance. Energy level has been adequate to good. Patient denies constipation and diarrhea. No myxedema. Medication is as noted below. Verified that pt is taking it daily on an empty stomach. Well tolerated.   Follow-up of hypertension. Patient has no history of headache chest pain or shortness of breath or recent cough. Patient also denies symptoms of TIA such as numbness weakness lateralizing. Patient checks  blood pressure at home and has not had any elevated readings recently. Patient denies side effects from his medication. States taking it regularly.   Patient uses Aspercreme for adequate relief with arthritis of the right knee.  Additionally she has a great deal of arthritis in the hands that limits her use of them.  She tends to get a lot of bruising on her arms and hands as well without any known provocation.  Depression screen [facility] 2/9 08/13/2019 10/02/2018 04/02/2018  Decreased Interest 0 0 0  Down, Depressed, Hopeless 0 0 0  PHQ - 2 Score 0 0 0  Altered sleeping - 0 -  Tired, decreased energy - 0 -  Change in appetite - 0 -  Feeling bad or failure about yourself  - 0 -  Trouble concentrating - 0 -  Moving slowly or fidgety/restless - 0 -  Suicidal thoughts - 0 -  PHQ-9 Score - 0 -    History Xela has a past medical history of Bilateral inguinal hernia (BIH), Carpal tunnel syndrome on right, Colon polyp, COPD (chronic obstructive pulmonary disease) ([place]), Essential  hypertension, benign, Fibrocystic breast, Frequency of urination, History of skin cancer, Hypothyroidism, Lumbosacral spondylosis without myelopathy, Mixed hyperlipidemia, Myxomatous mitral valve, Obturator hernia, right, Osteoporosis, PONV (postoperative nausea and vomiting), Rectosigmoid cancer ([place]), Symptomatic menopausal or female climacteric states, and Ventral hernia.   She has a past surgical history that includes Cesarean section; Appendectomy; Cataract extraction w/ intraocular lens  implant, bilateral; Colon surgery (2004); Ventral hernia repair (11/21/2011); laparoscopy (11/21/2011); Inguinal hernia repair (11/21/2011); Tonsillectomy; and Total hip arthroplasty (Right, 03/04/2013).   Her family history includes Heart disease in her father and mother; Lung cancer in her brother.She reports that she has never smoked. She has never used smokeless tobacco. She reports that she does not drink alcohol or use drugs.    ROS Review of Systems  Constitutional: Negative for fever.  HENT: Positive for hearing loss. Negative for congestion, rhinorrhea and sore throat.   Respiratory: Negative for cough and shortness of breath.   Cardiovascular: Negative for chest pain and palpitations.  Gastrointestinal: Negative for abdominal pain.  Musculoskeletal: Positive for arthralgias (Hands, hips, knees. Uses aspercreme) and myalgias.  Psychiatric/Behavioral: Negative for agitation and suicidal ideas. The patient is not nervous/anxious.     Objective:  BP (!) 160/74   Pulse 89   Temp 97.8 F (36.6 C) (Temporal)   Resp 18   Ht 5' (1.524 m)   Wt 95 lb (43.1 kg)   SpO2 97%   BMI 18.55 kg/m   BP  Readings from Last 3 Encounters:  08/13/19 (!) 160/74  04/15/19 130/65  10/02/18 (!) 160/81    Wt Readings from Last 3 Encounters:  08/13/19 95 lb (43.1 kg)  04/15/19 104 lb (47.2 kg)  10/02/18 103 lb 3.2 oz (46.8 kg)     Physical Exam Constitutional:      General: She is not in acute distress.     Appearance: She is well-developed. She is ill-appearing (frail).  HENT:     Head: Normocephalic and atraumatic.     Right Ear: Tympanic membrane, ear canal and external ear normal. There is no impacted cerumen.     Left Ear: Tympanic membrane, ear canal and external ear normal. There is no impacted cerumen.     Nose: Nose normal.  Eyes:     Conjunctiva/sclera: Conjunctivae normal.     Pupils: Pupils are equal, round, and reactive to light.  Neck:     Musculoskeletal: Normal range of motion and neck supple.     Thyroid: No thyromegaly.  Cardiovascular:     Rate and Rhythm: Normal rate and regular rhythm.     Heart sounds: Normal heart sounds. No murmur.  Pulmonary:     Effort: Pulmonary effort is normal. No respiratory distress.     Breath sounds: Normal breath sounds. No wheezing or rales.  Abdominal:     General: Bowel sounds are normal. There is no distension.     Palpations: Abdomen is soft.     Tenderness: There is no abdominal tenderness.  Musculoskeletal:        General: Deformity (lg heberdeens nodes on hands) present.  Lymphadenopathy:     Cervical: No cervical adenopathy.  Skin:    General: Skin is warm and dry.  Neurological:     Mental Status: She is alert and oriented to person, place, and time.     Deep Tendon Reflexes: Reflexes are normal and symmetric.  Psychiatric:        Behavior: Behavior normal.        Thought Content: Thought content normal.        Judgment: Judgment normal.       Assessment & Plan:   Gracieann was seen today for medical management of chronic issues.  Diagnoses and all orders for this visit:  Arthritis -     CBC with Differential/Platelet -     CMP14+EGFR  Essential hypertension, benign -     CBC with Differential/Platelet -     CMP14+EGFR  Simple chronic bronchitis (HCC) -     CBC with Differential/Platelet -     CMP14+EGFR  Pure hypercholesterolemia -     CBC with Differential/Platelet -     CMP14+EGFR -     Lipid panel   Osteoporosis, post-menopausal -     CBC with Differential/Platelet -     CMP14+EGFR  Senile purpura (HCC) -     CBC with Differential/Platelet -     CMP14+EGFR  Hypothyroidism, unspecified type -     TSH + free T4  Other orders -     losartan (COZAAR) 100 MG tablet; TAKE ONE (1) TABLET EACH DAY -     levothyroxine (SYNTHROID) 25 MCG tablet; TAKE 2 TABLETS MONDAY-FRIDAY AND TAKE 1 TABLET SATURDAY & SUNDAY -     hydrochlorothiazide (MICROZIDE) 12.5 MG capsule; TAKE ONE (1) CAPSULE EACH DAY -     atorvastatin (LIPITOR) 10 MG tablet; Take 0.5 tablets (5 mg total) by mouth at bedtime. -     amLODipine (NORVASC) 2.5  MG tablet; Take 1 tablet (2.5 mg total) by mouth daily. For blood pressure -     diclofenac sodium (VOLTAREN) 1 % GEL; Apply 4 g topically 4 (four) times daily.       I have changed [place] T. Gautney's atorvastatin. I am also having her start on amLODipine and diclofenac sodium. Additionally, I am having her maintain her Vitamin D3, beta carotene w/minerals, Calcium Citrate-Vitamin D (CITRACAL + D PO), aspirin, Omega-3 Fatty Acids (FISH OIL PO), losartan, levothyroxine, and hydrochlorothiazide.  Allergies as of 08/13/2019      Reactions   Alendronate Sodium Nausea Only   Lodine [etodolac]    unknown   Risedronate Sodium    unknown      Medication List       Accurate as of August 13, 2019 12:02 PM. If you have any questions, ask your nurse or doctor.        amLODipine 2.5 MG tablet Commonly known as: NORVASC Take 1 tablet (2.5 mg total) by mouth daily. For blood pressure Started by: Claretta Fraise, MD   aspirin 81 MG tablet Take 81 mg by mouth 3 (three) times a week.   atorvastatin 10 MG tablet Commonly known as: LIPITOR Take 0.5 tablets (5 mg total) by mouth at bedtime.   beta carotene w/minerals tablet Take 1 tablet by mouth daily.   CITRACAL + D PO Take 2 tablets by mouth daily.   diclofenac sodium 1 % Gel Commonly known as: Voltaren Apply 4 g  topically 4 (four) times daily. Started by: Claretta Fraise, MD   FISH OIL PO Take 1 tablet by mouth daily.   hydrochlorothiazide 12.5 MG capsule Commonly known as: MICROZIDE TAKE ONE (1) CAPSULE EACH DAY   levothyroxine 25 MCG tablet Commonly known as: SYNTHROID TAKE 2 TABLETS MONDAY-FRIDAY AND TAKE 1 TABLET SATURDAY & SUNDAY   losartan 100 MG tablet Commonly known as: COZAAR TAKE ONE (1) TABLET EACH DAY   Vitamin D3 50 MCG (2000 UT) Tabs Take 1 tablet by mouth daily.        Follow-up: Return in about 3 months (around 11/13/2019).  Claretta Fraise, M.D.

## 2019-08-13 NOTE — Telephone Encounter (Addendum)
Prior Auth for Diclofenac Sodium 1% Gel-APPROVED  Effective from 08/13/2019 through 08/12/2020   Pharmacy notified

## 2019-08-14 LAB — CMP14+EGFR
ALT: 14 IU/L (ref 0–32)
AST: 21 IU/L (ref 0–40)
Albumin/Globulin Ratio: 1.5 (ref 1.2–2.2)
Albumin: 4.1 g/dL (ref 3.5–4.6)
Alkaline Phosphatase: 66 IU/L (ref 39–117)
BUN/Creatinine Ratio: 25 (ref 12–28)
BUN: 19 mg/dL (ref 10–36)
Bilirubin Total: 0.5 mg/dL (ref 0.0–1.2)
CO2: 21 mmol/L (ref 20–29)
Calcium: 10 mg/dL (ref 8.7–10.3)
Chloride: 96 mmol/L (ref 96–106)
Creatinine, Ser: 0.77 mg/dL (ref 0.57–1.00)
GFR calc Af Amer: 78 mL/min/{1.73_m2} (ref >59)
GFR calc non Af Amer: 67 mL/min/{1.73_m2} (ref >59)
Globulin, Total: 2.8 g/dL (ref 1.5–4.5)
Glucose: 102 mg/dL — ABNORMAL HIGH (ref 65–99)
Potassium: 4.2 mmol/L (ref 3.5–5.2)
Sodium: 133 mmol/L — ABNORMAL LOW (ref 134–144)
Total Protein: 6.9 g/dL (ref 6.0–8.5)

## 2019-08-14 LAB — CBC WITH DIFFERENTIAL/PLATELET
Basophils Absolute: 0 10*3/uL (ref 0.0–0.2)
Basos: 1 %
EOS (ABSOLUTE): 0 10*3/uL (ref 0.0–0.4)
Eos: 1 %
Hematocrit: 37.6 % (ref 34.0–46.6)
Hemoglobin: 12.6 g/dL (ref 11.1–15.9)
Immature Grans (Abs): 0 10*3/uL (ref 0.0–0.1)
Immature Granulocytes: 0 %
Lymphocytes Absolute: 0.9 10*3/uL (ref 0.7–3.1)
Lymphs: 25 %
MCH: 31.7 pg (ref 26.6–33.0)
MCHC: 33.5 g/dL (ref 31.5–35.7)
MCV: 95 fL (ref 79–97)
Monocytes Absolute: 0.5 10*3/uL (ref 0.1–0.9)
Monocytes: 15 %
Neutrophils Absolute: 2.1 10*3/uL (ref 1.4–7.0)
Neutrophils: 58 %
Platelets: 185 10*3/uL (ref 150–450)
RBC: 3.97 x10E6/uL (ref 3.77–5.28)
RDW: 13.1 % (ref 11.7–15.4)
WBC: 3.6 10*3/uL (ref 3.4–10.8)

## 2019-08-14 LAB — LIPID PANEL
Chol/HDL Ratio: 2 ratio (ref 0.0–4.4)
Cholesterol, Total: 184 mg/dL (ref 100–199)
HDL: 90 mg/dL (ref >39)
LDL Chol Calc (NIH): 86 mg/dL (ref 0–99)
Triglycerides: 37 mg/dL (ref 0–149)
VLDL Cholesterol Cal: 8 mg/dL (ref 5–40)

## 2019-08-14 LAB — TSH+FREE T4
Free T4: 1.68 ng/dL (ref 0.82–1.77)
TSH: 1.78 u[IU]/mL (ref 0.450–4.500)

## 2019-08-14 NOTE — Progress Notes (Signed)
Hello [place],  Your lab result is normal and/or stable.Some minor variations that are not significant are commonly marked abnormal, but do not represent any medical problem for you.  Best regards, Claretta Fraise, M.D.

## 2019-09-15 ENCOUNTER — Other Ambulatory Visit: Payer: Self-pay

## 2019-09-15 ENCOUNTER — Ambulatory Visit: Payer: Medicare Other

## 2019-09-15 ENCOUNTER — Telehealth: Payer: Self-pay | Admitting: Family Medicine

## 2019-09-15 VITALS — BP 130/74 | HR 66

## 2019-09-15 DIAGNOSIS — I10 Essential (primary) hypertension: Secondary | ICD-10-CM

## 2019-09-15 NOTE — Progress Notes (Signed)
Patient here today to have blood pressure checked per Dr. Livia Snellen.  Blood pressure reading is 130/74.

## 2019-09-15 NOTE — Telephone Encounter (Signed)
Spoke with patient's daughter.  She has noticed recently that her mom is having some moments of confusion.  Seems to come and go.  She will continue to monitor and let us know if it gets any worse, but she did want you to know.

## 2019-09-15 NOTE — Telephone Encounter (Signed)
Please contact the patient Tell I said thanks, and I will be happy to evaluate this whenever she feels it is time.

## 2019-10-02 DIAGNOSIS — I70203 Unspecified atherosclerosis of native arteries of extremities, bilateral legs: Secondary | ICD-10-CM | POA: Diagnosis not present

## 2019-10-02 DIAGNOSIS — L84 Corns and callosities: Secondary | ICD-10-CM | POA: Diagnosis not present

## 2019-10-02 DIAGNOSIS — B351 Tinea unguium: Secondary | ICD-10-CM | POA: Diagnosis not present

## 2019-10-02 DIAGNOSIS — M79676 Pain in unspecified toe(s): Secondary | ICD-10-CM | POA: Diagnosis not present

## 2019-10-28 ENCOUNTER — Ambulatory Visit: Payer: Medicare Other | Admitting: Cardiology

## 2019-10-30 ENCOUNTER — Encounter: Payer: Self-pay | Admitting: Cardiology

## 2019-10-30 ENCOUNTER — Other Ambulatory Visit: Payer: Self-pay

## 2019-10-30 ENCOUNTER — Ambulatory Visit (INDEPENDENT_AMBULATORY_CARE_PROVIDER_SITE_OTHER): Payer: Medicare Other | Admitting: Cardiology

## 2019-10-30 VITALS — BP 147/81 | HR 82 | Ht 62.0 in | Wt 92.0 lb

## 2019-10-30 DIAGNOSIS — I10 Essential (primary) hypertension: Secondary | ICD-10-CM | POA: Diagnosis not present

## 2019-10-30 DIAGNOSIS — I340 Nonrheumatic mitral (valve) insufficiency: Secondary | ICD-10-CM | POA: Diagnosis not present

## 2019-10-30 NOTE — Patient Instructions (Signed)

## 2019-10-30 NOTE — Progress Notes (Signed)
Cardiology Office Note  Date: 10/30/2019   ID: Brianna [name], Brianna [name] April 26, 1925, MRN [id number]  PCP:  Brianna Fraise, MD  Cardiologist:  Brianna Lesches, MD Electrophysiologist:  None   Chief Complaint  Patient presents with  . Cardiac follow-up    History of Present Illness: Brianna [name] is a 85 y.o. female last assessed via telehealth encounter in June.  She is here today with her daughter for a follow-up visit.  They report no major change since last encounter.  [name] does not describe any worsening shortness of breath with basic ADLs, no orthopnea or PND.  She does have mild ankle swelling at times.  I reviewed her medications which are outlined below.  There have been no significant changes.  I reviewed her most recent lab work which is outlined below.  I personally reviewed her ECG today which shows normal sinus rhythm with right bundle branch block.  Past Medical History:  Diagnosis Date  . Bilateral inguinal hernia (BIH)   . Carpal tunnel syndrome on right   . Colon polyp   . COPD (chronic obstructive pulmonary disease) ([facility])   . Essential hypertension   . Fibrocystic breast   . Frequency of urination   . History of skin cancer   . Hypothyroidism   . Lumbosacral spondylosis without myelopathy   . Mixed hyperlipidemia   . Myxomatous mitral valve    Mild prolapse with eccentric, moderate to severe regurgitation  . Obturator hernia, right   . Osteoporosis   . 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 Symptomatic menopausal or female climacteric states   . Ventral hernia     Past Surgical History:  Procedure Laterality Date  . APPENDECTOMY    . CATARACT EXTRACTION W/ INTRAOCULAR LENS  IMPLANT, BILATERAL    . CESAREAN SECTION    . COLON SURGERY  2004   8'04-open LAR for rectosigmoid cancer T1N0 within polyp  . INGUINAL HERNIA REPAIR  11/21/2011   Procedure: LAPAROSCOPIC BILATERAL INGUINAL HERNIA REPAIR;  Surgeon: Brianna Hector, MD;  Location: [facility]  ORS;  Service: General;  Laterality: N/A;  . LAPAROSCOPY  11/21/2011   R obturator hernia repair  . TONSILLECTOMY    . TOTAL HIP ARTHROPLASTY Right 03/04/2013   Procedure: RIGHT TOTAL HIP ARTHROPLASTY ANTERIOR APPROACH;  Surgeon: Brianna Pole, MD;  Location: [facility] ORS;  Service: Orthopedics;  Laterality: Right;  . VENTRAL HERNIA REPAIR  11/21/2011   Procedure: LAPAROSCOPIC VENTRAL HERNIA;  Surgeon: Brianna Hector, MD;  Location: [facility] ORS;  Service: General;  Laterality: N/A;  laparoscopic bilateral inguinal hernia repair with mesh and ventral hernia repair with mesh    Current Outpatient Medications  Medication Sig Dispense Refill  . amLODipine (NORVASC) 2.5 MG tablet Take 1 tablet (2.5 mg total) by mouth daily. For blood pressure 90 tablet 1  . aspirin 81 MG tablet Take 81 mg by mouth 3 (three) times a week.    Marland Kitchen atorvastatin (LIPITOR) 10 MG tablet Take 0.5 tablets (5 mg total) by mouth at bedtime. 45 tablet 1  . beta carotene w/minerals (OCUVITE) tablet Take 1 tablet by mouth daily.      . Calcium Citrate-Vitamin D (CITRACAL + D PO) Take 2 tablets by mouth daily.     . Cholecalciferol (VITAMIN D3) 2000 UNITS TABS Take 1 tablet by mouth daily.     . diclofenac sodium (VOLTAREN) 1 % GEL Apply 4 g topically 4 (four) times daily. 450 g 5  .  hydrochlorothiazide (MICROZIDE) 12.5 MG capsule TAKE ONE (1) CAPSULE EACH DAY 90 capsule 1  . levothyroxine (SYNTHROID) 25 MCG tablet TAKE 2 TABLETS MONDAY-FRIDAY AND TAKE 1 TABLET SATURDAY & SUNDAY 154 tablet 1  . losartan (COZAAR) 100 MG tablet TAKE ONE (1) TABLET EACH DAY 90 tablet 0  . Omega-3 Fatty Acids (FISH OIL PO) Take 1 tablet by mouth daily.     No current facility-administered medications for this visit.   Allergies:  Alendronate sodium, Lodine [etodolac], and Risedronate sodium   Social History: The patient  reports that she has never smoked. She has never used smokeless tobacco. She reports that she does not drink alcohol or use drugs.   ROS:   Please see the history of present illness. Otherwise, complete review of systems is positive for right knee pain, she uses a cane to ambulate.  All other systems are reviewed and negative.   Physical Exam: VS:  BP (!) 147/81   Pulse 82   Ht 5\' 2"  (1.575 m)   Wt 92 lb (41.7 kg)   SpO2 100%   BMI 16.83 kg/m , BMI Body mass index is 16.83 kg/m.  Wt Readings from Last 3 Encounters:  10/30/19 92 lb (41.7 kg)  08/13/19 95 lb (43.1 kg)  04/15/19 104 lb (47.2 kg)    General: Elderly woman, appears comfortable at rest. HEENT: Conjunctiva and lids normal, wearing a mask. Neck: Supple, no elevated JVP or carotid bruits, no thyromegaly. Lungs: Clear to auscultation, nonlabored breathing at rest. Cardiac: Regular rate and rhythm, no S3, 3/6 laterally displaced mid to late systolic murmur. Abdomen: Soft, nontender, bowel sounds present. Extremities: Trace to 1+ ankle edema, distal pulses 2+. Skin: Warm and dry. Musculoskeletal: Mild kyphosis. Neuropsychiatric: Alert and oriented x3, affect grossly appropriate.  Hearing loss evident.  ECG:  An ECG dated 08/02/2018 was personally reviewed today and demonstrated:  Sinus rhythm.  Recent Labwork: 08/13/2019: ALT 14; AST 21; BUN 19; Creatinine, Ser 0.77; Hemoglobin 12.6; Platelets 185; Potassium 4.2; Sodium 133; TSH 1.780     Component Value Date/Time   CHOL 184 08/13/2019 1158   TRIG 37 08/13/2019 1158   TRIG 62 08/24/2014 1230   HDL 90 08/13/2019 1158   HDL 72 08/24/2014 1230   CHOLHDL 2.0 08/13/2019 1158   CHOLHDL 2.7 03/25/2013 1255   VLDL 12 03/25/2013 1255   LDLCALC 86 08/13/2019 1158   LDLCALC 79 08/21/2013 0838    Other Studies Reviewed Today:  Echocardiogram 02/20/2013: Study Conclusions  - Left ventricle: The cavity size was normal. Wall thickness was increased in a pattern of mild LVH. There was mild concentric hypertrophy. Systolic function was normal. The estimated ejection fraction was in the range of 60%  to 65%. Wall motion was normal; there were no regional wall motion abnormalities. Doppler parameters are consistent with abnormal left ventricular relaxation (grade 1 diastolic dysfunction). - Aortic valve: Trileaflet; mildly thickened, mildly calcified leaflets. Mild regurgitation. Mean gradient: 19mm Hg (S). Valve area: 2.4cm^2(VTI). - Mitral valve: Mildly to moderately thickened leaflets - appear myxomatous. Mild prolapse, involving the anterior leaflet and the posterior leaflet. Moderate to severe regurgitation directed eccentrically. Unable to quantify more objectively based on limited interrogation. - Left atrium: The atrium was moderately dilated. - Right atrium: The atrium was mildly dilated. Central venous pressure: 4mm Hg (est). - Tricuspid valve: Mild regurgitation. - Pulmonic valve: Mild regurgitation directed eccentrically. - Pulmonary arteries: PA peak pressure: 65mm Hg (S). PA pressure: 5mm Hg (ED). - Pericardium, extracardiac: There was no  pericardial effusion.  Assessment and Plan:  1.  Mitral valve prolapse with moderate to severe eccentric mitral regurgitation.  We are managing this conservatively and without follow-up imaging studies after previous discussions.  She does not report any progressive dyspnea with typical ADLs.  She does have some mild ankle edema, no orthopnea or PND.  She is on HCTZ.  2.  Essential hypertension, systolic is in the 0000000 today.  Continue Norvasc, Cozaar, and HCTZ.  Recent lab work reviewed.  Medication Adjustments/Labs and Tests Ordered: Current medicines are reviewed at length with the patient today.  Concerns regarding medicines are outlined above.   Tests Ordered: Orders Placed This Encounter  Procedures  . EKG 12-Lead    Medication Changes: No orders of the defined types were placed in this encounter.   Disposition:  Follow up 6 months virtual or office visit.  Signed, Satira Sark, MD,  [facility] 10/30/2019 10:54 AM    [facility] [place], [place], [place] [address] Phone: [phone]; Fax: [phone]

## 2019-11-03 ENCOUNTER — Other Ambulatory Visit: Payer: Self-pay | Admitting: Family Medicine

## 2019-11-18 ENCOUNTER — Other Ambulatory Visit: Payer: Self-pay

## 2019-11-19 ENCOUNTER — Encounter: Payer: Self-pay | Admitting: Family Medicine

## 2019-11-19 ENCOUNTER — Ambulatory Visit (INDEPENDENT_AMBULATORY_CARE_PROVIDER_SITE_OTHER): Payer: Medicare Other | Admitting: Family Medicine

## 2019-11-19 VITALS — BP 160/70 | HR 63 | Temp 97.4°F | Ht 60.0 in | Wt 92.2 lb

## 2019-11-19 DIAGNOSIS — E559 Vitamin D deficiency, unspecified: Secondary | ICD-10-CM

## 2019-11-19 DIAGNOSIS — J410 Simple chronic bronchitis: Secondary | ICD-10-CM

## 2019-11-19 DIAGNOSIS — E039 Hypothyroidism, unspecified: Secondary | ICD-10-CM

## 2019-11-19 DIAGNOSIS — I10 Essential (primary) hypertension: Secondary | ICD-10-CM | POA: Diagnosis not present

## 2019-11-19 DIAGNOSIS — I739 Peripheral vascular disease, unspecified: Secondary | ICD-10-CM

## 2019-11-19 DIAGNOSIS — D692 Other nonthrombocytopenic purpura: Secondary | ICD-10-CM

## 2019-11-19 DIAGNOSIS — M1990 Unspecified osteoarthritis, unspecified site: Secondary | ICD-10-CM

## 2019-11-19 DIAGNOSIS — E7800 Pure hypercholesterolemia, unspecified: Secondary | ICD-10-CM | POA: Diagnosis not present

## 2019-11-19 DIAGNOSIS — H9113 Presbycusis, bilateral: Secondary | ICD-10-CM

## 2019-11-19 MED ORDER — DICLOFENAC SODIUM 1 % EX GEL: 4.0000 g | Freq: Four times a day (QID) | CUTANEOUS | 5 refills | Status: DC

## 2019-11-19 MED ORDER — AMLODIPINE BESYLATE 5 MG PO TABS: 5.0000 mg | ORAL_TABLET | Freq: Every day | ORAL | 1 refills | Status: DC

## 2019-11-19 MED ORDER — LEVOTHYROXINE SODIUM 25 MCG PO TABS: ORAL_TABLET | ORAL | 1 refills | Status: DC

## 2019-11-19 MED ORDER — HYDROCHLOROTHIAZIDE 12.5 MG PO CAPS: ORAL_CAPSULE | ORAL | 1 refills | Status: DC

## 2019-11-19 MED ORDER — ATORVASTATIN CALCIUM 10 MG PO TABS: 5.0000 mg | ORAL_TABLET | Freq: Every day | ORAL | 1 refills | Status: DC

## 2019-11-19 MED ORDER — LOSARTAN POTASSIUM 100 MG PO TABS: ORAL_TABLET | ORAL | 0 refills | Status: DC

## 2019-11-19 NOTE — Progress Notes (Signed)
Subjective:  Patient ID: Brianna [name], female    DOB: [birth year]  Age: 86 y.o. MRN: [id number]  CC: Hypertension (3 month ), Hyperlipidemia, and Hypothyroidism   HPI Brianna [name] presents for  follow-up of hypertension. Patient has no history of headache chest pain or shortness of breath or recent cough. Patient also denies symptoms of TIA such as focal numbness or weakness. Patient denies side effects from medication. States taking it regularly.  Patient's daughter is with her and initial comments are that she is so hard of hearing that she cannot participate in a conversation.  With talking loudly in a  low pitched voice I was able to ascertain that her right knee is still having moderate pain.  She uses the diclofenac interspersed with use of the Aspercreme.  She does not seem to understand that it is available for use 4 times a day.  However that she cannot use both within several hours of each other.  Patient presents for follow-up on  thyroid. The patient has a history of hypothyroidism for many years. It has been stable recently. Pt. denies any change in  voice, loss of hair,.  She does report cold intolerance. Energy level has been adequate to good. Patient denies constipation and diarrhea. No myxedema. Medication is as noted below. Verified that pt is taking it daily on an empty stomach. Well tolerated.  Patient in for follow-up of elevated cholesterol. Doing well without complaints on current medication. Denies side effects of statin including myalgia and arthralgia and nausea. Also in today for liver function testing. Currently no chest pain, shortness of breath or other cardiovascular related symptoms noted. History Brianna [name] has a past medical history of Bilateral inguinal hernia (BIH), Carpal tunnel syndrome on right, Colon polyp, COPD (chronic obstructive pulmonary disease) ([facility]), Essential hypertension, Fibrocystic breast, Frequency of urination, History of skin cancer,  Hypothyroidism, Lumbosacral spondylosis without myelopathy, Mixed hyperlipidemia, Myxomatous mitral valve, Obturator hernia, right, Osteoporosis, Rectosigmoid cancer ([facility]), Symptomatic menopausal or female climacteric states, and Ventral hernia.   She has a past surgical history that includes Cesarean section; Appendectomy; Cataract extraction w/ intraocular lens  implant, bilateral; Colon surgery (2004); Ventral hernia repair (11/21/2011); laparoscopy (11/21/2011); Inguinal hernia repair (11/21/2011); Tonsillectomy; and Total hip arthroplasty (Right, 03/04/2013).   Her family history includes Heart disease in her father and mother; Lung cancer in her brother.She reports that she has never smoked. She has never used smokeless tobacco. She reports that she does not drink alcohol or use drugs.  Current Outpatient Medications on File Prior to Visit  Medication Sig Dispense Refill  . aspirin 81 MG tablet Take 81 mg by mouth 3 (three) times a week.    . beta carotene w/minerals (OCUVITE) tablet Take 1 tablet by mouth daily.      . Calcium Citrate-Vitamin D (CITRACAL + D PO) Take 2 tablets by mouth daily.     . Cholecalciferol (VITAMIN D3) 2000 UNITS TABS Take 1 tablet by mouth daily.     . Omega-3 Fatty Acids (FISH OIL PO) Take 1 tablet by mouth daily.     No current facility-administered medications on file prior to visit.    ROS Review of Systems  Constitutional: Negative.   HENT: Negative.   Eyes: Negative for visual disturbance.  Respiratory: Negative for shortness of breath.   Cardiovascular: Negative for chest pain.  Gastrointestinal: Negative for abdominal pain.  Musculoskeletal: Negative for arthralgias.  Skin:       Scattered bruising on arms, legs  Objective:  BP (!) 160/70   Pulse 63   Temp (!) 97.4 F (36.3 C) (Temporal)   Ht 5' (1.524 m)   Wt 92 lb 3.2 oz (41.8 kg)   SpO2 97%   BMI 18.01 kg/m   BP Readings from Last 3 Encounters:  11/19/19 (!) 160/70  10/30/19 (!)  147/81  09/15/19 130/74    Wt Readings from Last 3 Encounters:  11/19/19 92 lb 3.2 oz (41.8 kg)  10/30/19 92 lb (41.7 kg)  08/13/19 95 lb (43.1 kg)     Physical Exam Constitutional:      General: She is not in acute distress.    Appearance: She is well-developed.  HENT:     Head: Normocephalic and atraumatic.     Right Ear: Tympanic membrane and external ear normal.     Left Ear: Tympanic membrane and external ear normal.     Nose: Nose normal.  Eyes:     Conjunctiva/sclera: Conjunctivae normal.     Pupils: Pupils are equal, round, and reactive to light.  Neck:     Thyroid: No thyromegaly.  Cardiovascular:     Rate and Rhythm: Normal rate and regular rhythm.     Heart sounds: Normal heart sounds. No murmur.  Pulmonary:     Effort: Pulmonary effort is normal. No respiratory distress.     Breath sounds: Normal breath sounds. No wheezing or rales.  Abdominal:     General: Bowel sounds are normal. There is no distension.     Palpations: Abdomen is soft.     Tenderness: There is no abdominal tenderness.  Musculoskeletal:     Cervical back: Normal range of motion and neck supple.  Lymphadenopathy:     Cervical: No cervical adenopathy.  Skin:    General: Skin is warm and dry.  Neurological:     Mental Status: She is alert and oriented to person, place, and time.     Deep Tendon Reflexes: Reflexes are normal and symmetric.     Comments:   Very poor hearing demonstrated.  Very difficult to hold a conversation with the patient.  As a result talking into her ear directly with about the low pitched voice helped some but even that was not sufficient.  The remainder of history was obtained from her daughter who accompanies her today.  Psychiatric:        Behavior: Behavior normal.        Thought Content: Thought content normal.        Judgment: Judgment normal.       Assessment & Plan:   Brianna [name] was seen today for hypertension, hyperlipidemia and hypothyroidism.  Diagnoses  and all orders for this visit:  Arthritis -     CBC with Differential/Platelet -     CMP14+EGFR -     diclofenac Sodium (VOLTAREN) 1 % GEL; Apply 4 g topically 4 (four) times daily.  Essential hypertension, benign -     amLODipine (NORVASC) 5 MG tablet; Take 1 tablet (5 mg total) by mouth daily. for blood pressure -     hydrochlorothiazide (MICROZIDE) 12.5 MG capsule; TAKE ONE (1) CAPSULE EACH DAY -     losartan (COZAAR) 100 MG tablet; TAKE ONE (1) TABLET EACH DAY -     CBC with Differential/Platelet -     CMP14+EGFR  Simple chronic bronchitis (HCC) -     CBC with Differential/Platelet -     CMP14+EGFR  Pure hypercholesterolemia -     atorvastatin (LIPITOR) 10 MG tablet;  Take 0.5 tablets (5 mg total) by mouth at bedtime. -     CBC with Differential/Platelet -     CMP14+EGFR -     Lipid panel  Vitamin D deficiency -     CBC with Differential/Platelet -     CMP14+EGFR -     VITAMIN D 25 Hydroxy (Vit-D Deficiency, Fractures)  Peripheral vascular insufficiency (HCC) -     CBC with Differential/Platelet -     CMP14+EGFR  Senile purpura (HCC)  Hypothyroidism, unspecified type -     levothyroxine (SYNTHROID) 25 MCG tablet; TAKE 2 TABLETS MONDAY-FRIDAY AND TAKE 1 TABLET SATURDAY & SUNDAY -     CBC with Differential/Platelet -     CMP14+EGFR -     TSH + free T4  Presbycusis of both ears   Allergies as of 11/19/2019      Reactions   Alendronate Sodium Nausea Only   Lodine [etodolac]    unknown   Risedronate Sodium    unknown      Medication List       Accurate as of November 19, 2019 12:11 PM. If you have any questions, ask your nurse or doctor.        STOP taking these medications   diclofenac sodium 1 % Gel Commonly known as: Voltaren Replaced by: diclofenac Sodium 1 % Gel Stopped by: Claretta Fraise, MD     TAKE these medications   amLODipine 5 MG tablet Commonly known as: NORVASC Take 1 tablet (5 mg total) by mouth daily. for blood pressure What changed:     medication strength  how much to take Changed by: Claretta Fraise, MD   aspirin 81 MG tablet Take 81 mg by mouth 3 (three) times a week.   atorvastatin 10 MG tablet Commonly known as: LIPITOR Take 0.5 tablets (5 mg total) by mouth at bedtime.   beta carotene w/minerals tablet Take 1 tablet by mouth daily.   CITRACAL + D PO Take 2 tablets by mouth daily.   diclofenac Sodium 1 % Gel Commonly known as: Voltaren Apply 4 g topically 4 (four) times daily. Replaces: diclofenac sodium 1 % Gel Started by: Claretta Fraise, MD   FISH OIL PO Take 1 tablet by mouth daily.   hydrochlorothiazide 12.5 MG capsule Commonly known as: MICROZIDE TAKE ONE (1) CAPSULE EACH DAY   levothyroxine 25 MCG tablet Commonly known as: SYNTHROID TAKE 2 TABLETS MONDAY-FRIDAY AND TAKE 1 TABLET SATURDAY & SUNDAY   losartan 100 MG tablet Commonly known as: COZAAR TAKE ONE (1) TABLET EACH DAY   Vitamin D3 50 MCG (2000 UT) Tabs Take 1 tablet by mouth daily.       Meds ordered this encounter  Medications  . amLODipine (NORVASC) 5 MG tablet    Sig: Take 1 tablet (5 mg total) by mouth daily. for blood pressure    Dispense:  90 tablet    Refill:  1  . atorvastatin (LIPITOR) 10 MG tablet    Sig: Take 0.5 tablets (5 mg total) by mouth at bedtime.    Dispense:  45 tablet    Refill:  1  . hydrochlorothiazide (MICROZIDE) 12.5 MG capsule    Sig: TAKE ONE (1) CAPSULE EACH DAY    Dispense:  90 capsule    Refill:  1  . levothyroxine (SYNTHROID) 25 MCG tablet    Sig: TAKE 2 TABLETS MONDAY-FRIDAY AND TAKE 1 TABLET SATURDAY & SUNDAY    Dispense:  154 tablet    Refill:  1  .  losartan (COZAAR) 100 MG tablet    Sig: TAKE ONE (1) TABLET EACH DAY    Dispense:  90 tablet    Refill:  0  . diclofenac Sodium (VOLTAREN) 1 % GEL    Sig: Apply 4 g topically 4 (four) times daily.    Dispense:  480 g    Refill:  5       Follow-up: Return in about 3 months (around 02/17/2020).  Claretta Fraise, M.D.

## 2019-11-19 NOTE — Patient Instructions (Signed)
Mr. Brianna [name] [facility] [address] [place], [place] [address] [phone]  

## 2019-11-20 LAB — CBC WITH DIFFERENTIAL/PLATELET
Basophils Absolute: 0 10*3/uL (ref 0.0–0.2)
Basos: 1 %
EOS (ABSOLUTE): 0 10*3/uL (ref 0.0–0.4)
Eos: 1 %
Hematocrit: 38.6 % (ref 34.0–46.6)
Hemoglobin: 13 g/dL (ref 11.1–15.9)
Immature Grans (Abs): 0 10*3/uL (ref 0.0–0.1)
Immature Granulocytes: 0 %
Lymphocytes Absolute: 1.3 10*3/uL (ref 0.7–3.1)
Lymphs: 29 %
MCH: 31.6 pg (ref 26.6–33.0)
MCHC: 33.7 g/dL (ref 31.5–35.7)
MCV: 94 fL (ref 79–97)
Monocytes Absolute: 0.5 10*3/uL (ref 0.1–0.9)
Monocytes: 13 %
Neutrophils Absolute: 2.5 10*3/uL (ref 1.4–7.0)
Neutrophils: 56 %
Platelets: 203 10*3/uL (ref 150–450)
RBC: 4.11 x10E6/uL (ref 3.77–5.28)
RDW: 12.5 % (ref 11.7–15.4)
WBC: 4.3 10*3/uL (ref 3.4–10.8)

## 2019-11-20 LAB — CMP14+EGFR
ALT: 16 IU/L (ref 0–32)
AST: 20 IU/L (ref 0–40)
Albumin/Globulin Ratio: 1.8 (ref 1.2–2.2)
Albumin: 4.3 g/dL (ref 3.5–4.6)
Alkaline Phosphatase: 67 IU/L (ref 39–117)
BUN/Creatinine Ratio: 28 (ref 12–28)
BUN: 24 mg/dL (ref 10–36)
Bilirubin Total: 0.5 mg/dL (ref 0.0–1.2)
CO2: 25 mmol/L (ref 20–29)
Calcium: 10.1 mg/dL (ref 8.7–10.3)
Chloride: 95 mmol/L — ABNORMAL LOW (ref 96–106)
Creatinine, Ser: 0.86 mg/dL (ref 0.57–1.00)
GFR calc Af Amer: 67 mL/min/{1.73_m2} (ref >59)
GFR calc non Af Amer: 58 mL/min/{1.73_m2} — ABNORMAL LOW (ref >59)
Globulin, Total: 2.4 g/dL (ref 1.5–4.5)
Glucose: 87 mg/dL (ref 65–99)
Potassium: 4 mmol/L (ref 3.5–5.2)
Sodium: 134 mmol/L (ref 134–144)
Total Protein: 6.7 g/dL (ref 6.0–8.5)

## 2019-11-20 LAB — TSH+FREE T4
Free T4: 1.62 ng/dL (ref 0.82–1.77)
TSH: 1.78 u[IU]/mL (ref 0.450–4.500)

## 2019-11-20 LAB — LIPID PANEL
Chol/HDL Ratio: 1.9 ratio (ref 0.0–4.4)
Cholesterol, Total: 189 mg/dL (ref 100–199)
HDL: 97 mg/dL (ref >39)
LDL Chol Calc (NIH): 85 mg/dL (ref 0–99)
Triglycerides: 33 mg/dL (ref 0–149)
VLDL Cholesterol Cal: 7 mg/dL (ref 5–40)

## 2019-11-20 LAB — VITAMIN D 25 HYDROXY (VIT D DEFICIENCY, FRACTURES): Vit D, 25-Hydroxy: 52.6 ng/mL (ref 30.0–100.0)

## 2020-01-01 DIAGNOSIS — B351 Tinea unguium: Secondary | ICD-10-CM | POA: Diagnosis not present

## 2020-01-01 DIAGNOSIS — M79676 Pain in unspecified toe(s): Secondary | ICD-10-CM | POA: Diagnosis not present

## 2020-01-01 DIAGNOSIS — L84 Corns and callosities: Secondary | ICD-10-CM | POA: Diagnosis not present

## 2020-01-01 DIAGNOSIS — I70203 Unspecified atherosclerosis of native arteries of extremities, bilateral legs: Secondary | ICD-10-CM | POA: Diagnosis not present

## 2020-02-09 ENCOUNTER — Telehealth: Payer: Self-pay | Admitting: Family Medicine

## 2020-02-09 DIAGNOSIS — S7002XA Contusion of left hip, initial encounter: Secondary | ICD-10-CM | POA: Diagnosis not present

## 2020-02-09 DIAGNOSIS — S2242XA Multiple fractures of ribs, left side, initial encounter for closed fracture: Secondary | ICD-10-CM | POA: Diagnosis not present

## 2020-02-09 DIAGNOSIS — S79912A Unspecified injury of left hip, initial encounter: Secondary | ICD-10-CM | POA: Diagnosis not present

## 2020-02-09 NOTE — Telephone Encounter (Signed)
Since falling last week patient has developed cough and congestion.  Caregiver wants for her to be seen and evaluated here.  I explained to her that we are unable to see patient in the office with these symptoms and I recommended she take her either to urgent care or the emergency room for evaluation.

## 2020-02-17 ENCOUNTER — Ambulatory Visit (INDEPENDENT_AMBULATORY_CARE_PROVIDER_SITE_OTHER): Payer: Medicare Other | Admitting: Family Medicine

## 2020-02-17 ENCOUNTER — Ambulatory Visit (INDEPENDENT_AMBULATORY_CARE_PROVIDER_SITE_OTHER): Payer: Medicare Other

## 2020-02-17 ENCOUNTER — Other Ambulatory Visit: Payer: Self-pay

## 2020-02-17 ENCOUNTER — Encounter: Payer: Self-pay | Admitting: Family Medicine

## 2020-02-17 VITALS — BP 134/73 | HR 91 | Temp 97.5°F | Ht 60.0 in | Wt 94.0 lb

## 2020-02-17 DIAGNOSIS — R0989 Other specified symptoms and signs involving the circulatory and respiratory systems: Secondary | ICD-10-CM

## 2020-02-17 DIAGNOSIS — S2242XA Multiple fractures of ribs, left side, initial encounter for closed fracture: Secondary | ICD-10-CM

## 2020-02-17 DIAGNOSIS — F09 Unspecified mental disorder due to known physiological condition: Secondary | ICD-10-CM | POA: Diagnosis not present

## 2020-02-17 NOTE — Progress Notes (Signed)
Subjective:  Patient ID: Brianna [name], female    DOB: June 23, 1925  Age: 86 y.o. MRN: [id number]  CC: Follow-up (3 month)   HPI Brianna [name] presents for  follow-up of hypertension. Patient has no history of headache chest pain or shortness of breath or recent cough. Patient also denies symptoms of TIA such as focal numbness or weakness. Patient denies side effects from medication. States taking it regularly.  Blood pressure readings from home are running in the [id number] systolic according to the daughter.  Getting much of the history for her mother. Patient fell several days ago.  She was seen in the emergency department at [facility] 8 days ago, about a week after the fall.  Her daughter was concerned that she might be getting pneumonia so she had her seen at that time..  Her daughter is with her and gives the history of there being 3 broken ribs.  The patient today says they do not hurt much.  Patient's primary concern is right knee pain.  She is using the Voltaren gel 4 times a day and does get some relief.  She does not want pills.  Brianna [name] daughter says that she is getting forgetful.  The patient denies this and states she does not want to take medicine because it will make her feel sick.   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   She has a past surgical history that includes Cesarean section; Appendectomy; Cataract extraction w/ intraocular lens  implant, bilateral; Colon surgery (2004); Ventral hernia repair  (11/21/2011); laparoscopy (11/21/2011); Inguinal hernia repair (11/21/2011); Tonsillectomy; and Total hip arthroplasty (Right, 03/04/2013).   Her family history includes Heart disease in her father and mother; Lung cancer in her brother.She reports that she has never smoked. She has never used smokeless tobacco. She reports that she does not drink alcohol or use drugs.  Current Outpatient Medications on File Prior to Visit  Medication Sig Dispense Refill  . amLODipine (NORVASC) 5 MG tablet Take 1 tablet (5 mg total) by mouth daily. for blood pressure 90 tablet 1  . aspirin 81 MG tablet Take 81 mg by mouth 3 (three) times a week.    Marland Kitchen atorvastatin (LIPITOR) 10 MG tablet Take 0.5 tablets (5 mg total) by mouth at bedtime. 45 tablet 1  . beta carotene w/minerals (OCUVITE) tablet Take 1 tablet by mouth daily.      . Calcium Citrate-Vitamin D (CITRACAL + D PO) Take 2 tablets by mouth daily.     . Cholecalciferol (VITAMIN D3) 2000 UNITS TABS Take 1 tablet by mouth daily.     . diclofenac Sodium (VOLTAREN) 1 % GEL Apply 4 g topically 4 (four) times daily. 480 g 5  . hydrochlorothiazide (MICROZIDE) 12.5 MG capsule TAKE ONE (1) CAPSULE EACH DAY 90 capsule 1  . levothyroxine (SYNTHROID) 25 MCG tablet TAKE 2 TABLETS MONDAY-FRIDAY AND TAKE 1 TABLET SATURDAY & SUNDAY 154 tablet 1  . losartan (COZAAR) 100 MG tablet TAKE ONE (1) TABLET EACH DAY 90 tablet 0  . Omega-3 Fatty Acids (FISH OIL PO) Take 1 tablet by mouth daily.     No current facility-administered medications on file prior  to visit.    ROS Review of Systems  Constitutional: Negative.   HENT: Negative for congestion.   Eyes: Negative for visual disturbance.  Respiratory: Negative for shortness of breath.   Cardiovascular: Negative for chest pain.  Gastrointestinal: Negative for abdominal pain, constipation, diarrhea, nausea and vomiting.  Genitourinary: Negative for difficulty urinating.  Musculoskeletal: Positive for arthralgias (right knee).  Negative for myalgias.  Neurological: Negative for headaches.  Psychiatric/Behavioral: Negative for sleep disturbance.    Objective:  BP 134/73   Pulse 91   Temp (!) 97.5 F (36.4 C) (Temporal)   Ht 5' (1.524 m)   Wt 94 lb (42.6 kg)   BMI 18.36 kg/m   BP Readings from Last 3 Encounters:  02/17/20 134/73  11/19/19 (!) 160/70  10/30/19 (!) 147/81    Wt Readings from Last 3 Encounters:  02/17/20 94 lb (42.6 kg)  11/19/19 92 lb 3.2 oz (41.8 kg)  10/30/19 92 lb (41.7 kg)     Physical Exam Constitutional:      General: She is not in acute distress.    Appearance: She is well-developed.     Comments: Frail   Cardiovascular:     Rate and Rhythm: Normal rate. Rhythm irregular.  Pulmonary:     Breath sounds: Normal breath sounds.  Skin:    General: Skin is warm and dry.  Neurological:     Mental Status: She is alert and oriented to person, place, and time.    Chest x-ray, preliminary reading shows possible effusion right lower chest.,  Fractures cannot be appreciated of the left ribs at this time.  Await confirmation from radiology.   Assessment & Plan:   Brianna [name] was seen today for follow-up.  Diagnoses and all orders for this visit:  Rhonchi at right lung base -     DG Ribs Unilateral W/Chest Left; Future  Closed fracture of multiple ribs of left side, initial encounter -     DG Ribs Unilateral W/Chest Left; Future   Allergies as of 02/17/2020      Reactions   Alendronate Sodium Nausea Only   Lodine [etodolac]    unknown   Risedronate Sodium    unknown      Medication List       Accurate as of February 17, 2020 11:44 AM. If you have any questions, ask your nurse or doctor.        STOP taking these medications   traMADol 50 MG tablet Commonly known as: ULTRAM Stopped by: Brianna Fraise, MD     TAKE these medications   amLODipine 5 MG tablet Commonly known as: NORVASC Take 1 tablet (5 mg total) by mouth daily. for blood pressure   aspirin 81 MG  tablet Take 81 mg by mouth 3 (three) times a week.   atorvastatin 10 MG tablet Commonly known as: LIPITOR Take 0.5 tablets (5 mg total) by mouth at bedtime.   beta carotene w/minerals tablet Take 1 tablet by mouth daily.   CITRACAL + D PO Take 2 tablets by mouth daily.   diclofenac Sodium 1 % Gel Commonly known as: Voltaren Apply 4 g topically 4 (four) times daily.   FISH OIL PO Take 1 tablet by mouth daily.   hydrochlorothiazide 12.5 MG capsule Commonly known as: MICROZIDE TAKE ONE (1) CAPSULE EACH DAY   levothyroxine 25 MCG tablet Commonly known as: SYNTHROID TAKE 2 TABLETS MONDAY-FRIDAY AND TAKE 1 TABLET SATURDAY & SUNDAY   losartan 100 MG tablet Commonly known as: COZAAR TAKE ONE (  1) TABLET EACH DAY   Vitamin D3 50 MCG (2000 UT) Tabs Take 1 tablet by mouth daily.       No orders of the defined types were placed in this encounter.   Due to mild cognitive impairment, I offered Exelon patch.  At this time patient is reluctant because she does not want to take medicine because they tend to make her sick.  Daughter will consider those and discussed them with her mother.  I did note that the patient is somewhat repetitive in the exam room over and above that expected of the patient who is just having some trouble understanding directions.  Follow-up: No follow-ups on file.  Brianna [name], M.D.

## 2020-03-23 ENCOUNTER — Other Ambulatory Visit: Payer: Self-pay | Admitting: Family Medicine

## 2020-03-23 DIAGNOSIS — I10 Essential (primary) hypertension: Secondary | ICD-10-CM

## 2020-03-25 DIAGNOSIS — I70203 Unspecified atherosclerosis of native arteries of extremities, bilateral legs: Secondary | ICD-10-CM | POA: Diagnosis not present

## 2020-03-25 DIAGNOSIS — L84 Corns and callosities: Secondary | ICD-10-CM | POA: Diagnosis not present

## 2020-03-25 DIAGNOSIS — M79676 Pain in unspecified toe(s): Secondary | ICD-10-CM | POA: Diagnosis not present

## 2020-03-25 DIAGNOSIS — B351 Tinea unguium: Secondary | ICD-10-CM | POA: Diagnosis not present

## 2020-04-19 ENCOUNTER — Other Ambulatory Visit: Payer: Self-pay | Admitting: Family Medicine

## 2020-04-19 DIAGNOSIS — I10 Essential (primary) hypertension: Secondary | ICD-10-CM

## 2020-05-18 ENCOUNTER — Ambulatory Visit (INDEPENDENT_AMBULATORY_CARE_PROVIDER_SITE_OTHER): Payer: Medicare Other | Admitting: Family Medicine

## 2020-05-18 ENCOUNTER — Other Ambulatory Visit: Payer: Self-pay

## 2020-05-18 ENCOUNTER — Encounter: Payer: Self-pay | Admitting: Family Medicine

## 2020-05-18 VITALS — BP 126/78 | HR 72 | Temp 97.1°F | Resp 20 | Ht 60.0 in | Wt 89.4 lb

## 2020-05-18 DIAGNOSIS — I10 Essential (primary) hypertension: Secondary | ICD-10-CM | POA: Diagnosis not present

## 2020-05-18 DIAGNOSIS — M1990 Unspecified osteoarthritis, unspecified site: Secondary | ICD-10-CM

## 2020-05-18 DIAGNOSIS — E039 Hypothyroidism, unspecified: Secondary | ICD-10-CM | POA: Diagnosis not present

## 2020-05-18 DIAGNOSIS — I059 Rheumatic mitral valve disease, unspecified: Secondary | ICD-10-CM

## 2020-05-18 DIAGNOSIS — J410 Simple chronic bronchitis: Secondary | ICD-10-CM | POA: Diagnosis not present

## 2020-05-18 DIAGNOSIS — E7800 Pure hypercholesterolemia, unspecified: Secondary | ICD-10-CM

## 2020-05-18 DIAGNOSIS — D692 Other nonthrombocytopenic purpura: Secondary | ICD-10-CM

## 2020-05-18 DIAGNOSIS — E559 Vitamin D deficiency, unspecified: Secondary | ICD-10-CM | POA: Diagnosis not present

## 2020-05-18 DIAGNOSIS — E782 Mixed hyperlipidemia: Secondary | ICD-10-CM

## 2020-05-18 MED ORDER — AMLODIPINE BESYLATE 5 MG PO TABS: 5.0000 mg | ORAL_TABLET | Freq: Every day | ORAL | 1 refills | Status: DC

## 2020-05-18 MED ORDER — ATORVASTATIN CALCIUM 10 MG PO TABS: 5.0000 mg | ORAL_TABLET | Freq: Every day | ORAL | 1 refills | Status: DC

## 2020-05-18 MED ORDER — HYDROCHLOROTHIAZIDE 12.5 MG PO CAPS: ORAL_CAPSULE | ORAL | 1 refills | Status: DC

## 2020-05-18 MED ORDER — DICLOFENAC SODIUM 1 % EX GEL: 4.0000 g | Freq: Four times a day (QID) | CUTANEOUS | 5 refills | Status: DC

## 2020-05-18 MED ORDER — LOSARTAN POTASSIUM 100 MG PO TABS: ORAL_TABLET | ORAL | 0 refills | Status: DC

## 2020-05-18 NOTE — Progress Notes (Signed)
Subjective:  Patient ID: Brianna [name], female    DOB: February 13, 1925  Age: 86 y.o. MRN: [id number]  CC: Medical Management of Chronic Issues   HPI Brianna [name] presents for  presents for  follow-up of hypertension. Patient has no history of headache chest pain or shortness of breath or recent cough. Patient also denies symptoms of TIA such as focal numbness or weakness. Patient denies side effects from medication. States taking it regularly.  follow-up on  thyroid. The patient has a history of hypothyroidism for many years. It has been stable recently. Pt. denies any change in  voice, loss of hair, heat or cold intolerance. Energy level has been adequate to good. Patient denies constipation and diarrhea. No myxedema. Medication is as noted below. Verified that pt is taking it daily on an empty stomach. Well tolerated.  in for follow-up of elevated cholesterol. Doing well without complaints on current medication. Denies side effects of statin including myalgia and arthralgia and nausea. Currently no chest pain, shortness of breath or other cardiovascular related symptoms noted.   Depression screen [facility] 2/9 05/18/2020 02/17/2020 11/19/2019  Decreased Interest 0 0 0  Down, Depressed, Hopeless 0 0 0  PHQ - 2 Score 0 0 0  Altered sleeping - - -  Tired, decreased energy - - -  Change in appetite - - -  Feeling bad or failure about yourself  - - -  Trouble concentrating - - -  Moving slowly or fidgety/restless - - -  Suicidal thoughts - - -  PHQ-9 Score - - -    History Brianna [name] has a past medical history of Bilateral inguinal hernia (BIH), Carpal tunnel syndrome on right, Colon polyp, COPD (chronic obstructive pulmonary disease) ([place]), Essential hypertension, Fibrocystic breast, Frequency of urination, History of skin cancer, Hypothyroidism, Lumbosacral spondylosis without myelopathy, Mixed hyperlipidemia, Myxomatous mitral valve, Obturator hernia, right, Osteoporosis, 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   She has a past surgical history that includes Cesarean section; Appendectomy; Cataract extraction w/ intraocular lens  implant, bilateral; Colon surgery (2004); Ventral hernia repair (11/21/2011); laparoscopy (11/21/2011); Inguinal hernia repair (11/21/2011); Tonsillectomy; and Total hip arthroplasty (Right, 03/04/2013).   Her family history includes Heart disease in her father and mother; Lung cancer in her brother.She reports that she has never smoked. She has never used smokeless tobacco. She reports that she does not drink alcohol and does not use drugs.    ROS Review of Systems  Constitutional: Positive for unexpected weight change. Negative for appetite change.  HENT: Negative for congestion.   Eyes: Negative for visual disturbance.  Respiratory: Negative for shortness of breath.   Cardiovascular: Negative for chest pain.  Gastrointestinal: Negative for abdominal pain, constipation, diarrhea, nausea and vomiting.  Genitourinary: Negative for difficulty urinating.  Musculoskeletal: Negative for arthralgias and myalgias.  Neurological: Negative for headaches.  Psychiatric/Behavioral: Negative for sleep disturbance.    Objective:  BP 126/78   Pulse 72   Temp (!) 97.1 F (36.2 C) (Temporal)   Resp 20   Ht 5' (1.524 m)   Wt 89 lb 6 oz (40.5 kg)   SpO2 98%   BMI 17.45 kg/m   BP Readings from Last 3 Encounters:  05/18/20 126/78  02/17/20 134/73  11/19/19 (!) 160/70    Wt Readings from Last 3 Encounters:  05/18/20 89 lb 6 oz (40.5 kg)  02/17/20 94 lb (42.6 kg)  11/19/19 92 lb 3.2 oz (41.8 kg)  Physical Exam Constitutional:      General: She is not in acute distress.    Appearance: She is well-developed.     Comments: Frail, underweight  HENT:     Head: Normocephalic and atraumatic.     Right Ear: External ear normal.     Left Ear: External ear normal.     Nose: Nose normal.  Eyes:      Conjunctiva/sclera: Conjunctivae normal.     Pupils: Pupils are equal, round, and reactive to light.  Neck:     Thyroid: No thyromegaly.  Cardiovascular:     Rate and Rhythm: Normal rate and regular rhythm.     Heart sounds: Normal heart sounds. No murmur heard.   Pulmonary:     Effort: Pulmonary effort is normal. No respiratory distress.     Breath sounds: Normal breath sounds. No wheezing or rales.  Abdominal:     General: Bowel sounds are normal. There is no distension.     Palpations: Abdomen is soft.     Tenderness: There is no abdominal tenderness.  Musculoskeletal:     Cervical back: Normal range of motion and neck supple.  Lymphadenopathy:     Cervical: No cervical adenopathy.  Skin:    General: Skin is warm and dry.  Neurological:     Mental Status: She is alert and oriented to person, place, and time.     Deep Tendon Reflexes: Reflexes are normal and symmetric.  Psychiatric:        Behavior: Behavior normal.        Thought Content: Thought content normal.        Judgment: Judgment normal.       Assessment & Plan:   There are no diagnoses linked to this encounter.     I am having Brianna [name] maintain her Vitamin D3, beta carotene w/minerals, Calcium Citrate-Vitamin D (CITRACAL + D PO), aspirin, Omega-3 Fatty Acids (FISH OIL PO), atorvastatin, hydrochlorothiazide, levothyroxine, diclofenac Sodium, amLODipine, and losartan.  Allergies as of 05/18/2020      Reactions   Alendronate Sodium Nausea Only   Lodine [etodolac]    unknown   Risedronate Sodium    unknown      Medication List       Accurate as of May 18, 2020  1:04 PM. If you have any questions, ask your nurse or doctor.        amLODipine 5 MG tablet Commonly known as: NORVASC TAKE 1 TABLET DAILY FOR BLOOD PRESSURE   aspirin 81 MG tablet Take 81 mg by mouth 3 (three) times a week.   atorvastatin 10 MG tablet Commonly known as: LIPITOR Take 0.5 tablets (5 mg total) by mouth at  bedtime.   beta carotene w/minerals tablet Take 1 tablet by mouth daily.   CITRACAL + D PO Take 2 tablets by mouth daily.   diclofenac Sodium 1 % Gel Commonly known as: Voltaren Apply 4 g topically 4 (four) times daily.   FISH OIL PO Take 1 tablet by mouth daily.   hydrochlorothiazide 12.5 MG capsule Commonly known as: MICROZIDE TAKE ONE (1) CAPSULE EACH DAY   levothyroxine 25 MCG tablet Commonly known as: SYNTHROID TAKE 2 TABLETS MONDAY-FRIDAY AND TAKE 1 TABLET SATURDAY & SUNDAY   losartan 100 MG tablet Commonly known as: COZAAR TAKE ONE (1) TABLET EACH DAY   Vitamin D3 50 MCG (2000 UT) Tabs Take 1 tablet by mouth daily.        Follow-up: No follow-ups on file.  Claretta Fraise, M.D.

## 2020-05-19 LAB — CMP14+EGFR
ALT: 15 IU/L (ref 0–32)
AST: 23 IU/L (ref 0–40)
Albumin/Globulin Ratio: 1.8 (ref 1.2–2.2)
Albumin: 4.1 g/dL (ref 3.5–4.6)
Alkaline Phosphatase: 73 IU/L (ref 48–121)
BUN/Creatinine Ratio: 30 — ABNORMAL HIGH (ref 12–28)
BUN: 25 mg/dL (ref 10–36)
Bilirubin Total: 0.3 mg/dL (ref 0.0–1.2)
CO2: 23 mmol/L (ref 20–29)
Calcium: 9.6 mg/dL (ref 8.7–10.3)
Chloride: 99 mmol/L (ref 96–106)
Creatinine, Ser: 0.83 mg/dL (ref 0.57–1.00)
GFR calc Af Amer: 70 mL/min/{1.73_m2} (ref >59)
GFR calc non Af Amer: 61 mL/min/{1.73_m2} (ref >59)
Globulin, Total: 2.3 g/dL (ref 1.5–4.5)
Glucose: 87 mg/dL (ref 65–99)
Potassium: 4.4 mmol/L (ref 3.5–5.2)
Sodium: 136 mmol/L (ref 134–144)
Total Protein: 6.4 g/dL (ref 6.0–8.5)

## 2020-05-19 LAB — LIPID PANEL
Chol/HDL Ratio: 2 ratio (ref 0.0–4.4)
Cholesterol, Total: 146 mg/dL (ref 100–199)
HDL: 74 mg/dL (ref >39)
LDL Chol Calc (NIH): 63 mg/dL (ref 0–99)
Triglycerides: 33 mg/dL (ref 0–149)
VLDL Cholesterol Cal: 9 mg/dL (ref 5–40)

## 2020-05-19 LAB — CBC WITH DIFFERENTIAL/PLATELET
Basophils Absolute: 0 10*3/uL (ref 0.0–0.2)
Basos: 1 %
EOS (ABSOLUTE): 0 10*3/uL (ref 0.0–0.4)
Eos: 1 %
Hematocrit: 34.3 % (ref 34.0–46.6)
Hemoglobin: 11.3 g/dL (ref 11.1–15.9)
Immature Grans (Abs): 0 10*3/uL (ref 0.0–0.1)
Immature Granulocytes: 0 %
Lymphocytes Absolute: 1.2 10*3/uL (ref 0.7–3.1)
Lymphs: 33 %
MCH: 30.5 pg (ref 26.6–33.0)
MCHC: 32.9 g/dL (ref 31.5–35.7)
MCV: 93 fL (ref 79–97)
Monocytes Absolute: 0.5 10*3/uL (ref 0.1–0.9)
Monocytes: 15 %
Neutrophils Absolute: 1.8 10*3/uL (ref 1.4–7.0)
Neutrophils: 50 %
Platelets: 207 10*3/uL (ref 150–450)
RBC: 3.7 x10E6/uL — ABNORMAL LOW (ref 3.77–5.28)
RDW: 12.9 % (ref 11.7–15.4)
WBC: 3.5 10*3/uL (ref 3.4–10.8)

## 2020-05-19 LAB — VITAMIN D 25 HYDROXY (VIT D DEFICIENCY, FRACTURES): Vit D, 25-Hydroxy: 35.2 ng/mL (ref 30.0–100.0)

## 2020-05-19 LAB — TSH+FREE T4
Free T4: 1.49 ng/dL (ref 0.82–1.77)
TSH: 0.113 u[IU]/mL — ABNORMAL LOW (ref 0.450–4.500)

## 2020-05-19 NOTE — Progress Notes (Signed)
Hello [place],  Your lab result is normal and/or stable.Some minor variations that are not significant are commonly marked abnormal, but do not represent any medical problem for you.  Best regards, Claretta Fraise, M.D.

## 2020-07-22 DIAGNOSIS — M79676 Pain in unspecified toe(s): Secondary | ICD-10-CM | POA: Diagnosis not present

## 2020-07-22 DIAGNOSIS — L84 Corns and callosities: Secondary | ICD-10-CM | POA: Diagnosis not present

## 2020-07-22 DIAGNOSIS — I70203 Unspecified atherosclerosis of native arteries of extremities, bilateral legs: Secondary | ICD-10-CM | POA: Diagnosis not present

## 2020-07-22 DIAGNOSIS — B351 Tinea unguium: Secondary | ICD-10-CM | POA: Diagnosis not present

## 2020-08-04 ENCOUNTER — Encounter: Payer: Self-pay | Admitting: Cardiology

## 2020-08-04 ENCOUNTER — Ambulatory Visit (INDEPENDENT_AMBULATORY_CARE_PROVIDER_SITE_OTHER): Payer: Medicare Other | Admitting: Cardiology

## 2020-08-04 VITALS — BP 108/68 | HR 58 | Ht 60.0 in | Wt 90.0 lb

## 2020-08-04 DIAGNOSIS — I340 Nonrheumatic mitral (valve) insufficiency: Secondary | ICD-10-CM

## 2020-08-04 NOTE — Progress Notes (Signed)
Cardiology Office Note  Date: 08/04/2020   ID: Brianna [name], DOB August 07, 1925, MRN [id number]  PCP:  Claretta Fraise, MD  Cardiologist:  Rozann Lesches, MD Electrophysiologist:  None   Chief Complaint  Patient presents with  . Cardiac follow-up    History of Present Illness: Brianna [name] is a 86 y.o. female last seen in December 2020.  She is here for a follow-up visit with her daughter.  Her balance has not been as good and she has had some falls, fortunately without significant injury.  She is using a cane.  Leg swelling has not gotten any worse.  Weight is also stable with good appetite.  I reviewed her medications which are outlined below.  She reports compliance, has had no obvious intolerances.  Blood pressure normal today.  Past Medical History:  Diagnosis Date  . Bilateral inguinal hernia (BIH)   . Carpal tunnel syndrome on right   . Colon polyp   . COPD (chronic obstructive pulmonary disease) ([place])   . Essential hypertension   . Fibrocystic breast   . Frequency of urination   . History of skin cancer   . Hypothyroidism   . Lumbosacral spondylosis without myelopathy   . Mixed hyperlipidemia   . Myxomatous mitral valve    Mild prolapse with eccentric, moderate to severe regurgitation  . Obturator hernia, right   . Osteoporosis   . 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 Symptomatic menopausal or female climacteric states   . Ventral hernia     Past Surgical History:  Procedure Laterality Date  . APPENDECTOMY    . CATARACT EXTRACTION W/ INTRAOCULAR LENS  IMPLANT, BILATERAL    . CESAREAN SECTION    . COLON SURGERY  2004   8'04-open LAR for rectosigmoid cancer T1N0 within polyp  . INGUINAL HERNIA REPAIR  11/21/2011   Procedure: LAPAROSCOPIC BILATERAL INGUINAL HERNIA REPAIR;  Surgeon: Adin Hector, MD;  Location: [facility];  Service: General;  Laterality: N/A;  . LAPAROSCOPY  11/21/2011   R obturator hernia repair  . TONSILLECTOMY    . TOTAL HIP  ARTHROPLASTY Right 03/04/2013   Procedure: RIGHT TOTAL HIP ARTHROPLASTY ANTERIOR APPROACH;  Surgeon: Mauri Pole, MD;  Location: [facility];  Service: Orthopedics;  Laterality: Right;  . VENTRAL HERNIA REPAIR  11/21/2011   Procedure: LAPAROSCOPIC VENTRAL HERNIA;  Surgeon: Adin Hector, MD;  Location: [facility];  Service: General;  Laterality: N/A;  laparoscopic bilateral inguinal hernia repair with mesh and ventral hernia repair with mesh    Current Outpatient Medications  Medication Sig Dispense Refill  . amLODipine (NORVASC) 5 MG tablet Take 1 tablet (5 mg total) by mouth daily. for blood pressure 90 tablet 1  . atorvastatin (LIPITOR) 10 MG tablet Take 0.5 tablets (5 mg total) by mouth at bedtime. 45 tablet 1  . beta carotene w/minerals (OCUVITE) tablet Take 1 tablet by mouth daily.      . Calcium Citrate-Vitamin D (CITRACAL + D PO) Take 2 tablets by mouth daily.     . Cholecalciferol (VITAMIN D3) 2000 UNITS TABS Take 1 tablet by mouth daily.     . diclofenac Sodium (VOLTAREN) 1 % GEL Apply 4 g topically 4 (four) times daily. 480 g 5  . hydrochlorothiazide (MICROZIDE) 12.5 MG capsule TAKE ONE (1) CAPSULE EACH DAY 90 capsule 1  . levothyroxine (SYNTHROID) 25 MCG tablet TAKE 2 TABLETS MONDAY-FRIDAY AND TAKE 1 TABLET SATURDAY & SUNDAY 154 tablet 1  . losartan (  COZAAR) 100 MG tablet TAKE ONE (1) TABLET EACH DAY 90 tablet 0  . Omega-3 Fatty Acids (FISH OIL PO) Take 1 tablet by mouth daily.     No current facility-administered medications for this visit.   Allergies:  Alendronate sodium, Lodine [etodolac], and Risedronate sodium   ROS: Hearing loss.  She declines hearing aids.  Physical Exam: VS:  BP 108/68   Pulse (!) 58   Ht 5' (1.524 m)   Wt 90 lb (40.8 kg)   BMI 17.58 kg/m , BMI Body mass index is 17.58 kg/m.  Wt Readings from Last 3 Encounters:  08/04/20 90 lb (40.8 kg)  05/18/20 89 lb 6 oz (40.5 kg)  02/17/20 94 lb (42.6 kg)    General: Elderly woman, appears comfortable at  rest. HEENT: Conjunctiva and lids normal, wearing a mask. Neck: Supple, no elevated JVP or carotid bruits, no thyromegaly. Lungs: Clear to auscultation, nonlabored breathing at rest. Cardiac: Regular rate and rhythm, no S3, 3/6 laterally displaced mid-to-late systolic murmur. Extremities: Mild ankle edema.  ECG:  An ECG dated 10/30/2019 was personally reviewed today and demonstrated:  Sinus rhythm with right bundle branch block.  Recent Labwork: 05/18/2020: ALT 15; AST 23; BUN 25; Creatinine, Ser 0.83; Hemoglobin 11.3; Platelets 207; Potassium 4.4; Sodium 136; TSH 0.113     Component Value Date/Time   CHOL 146 05/18/2020 1322   TRIG 33 05/18/2020 1322   TRIG 62 08/24/2014 1230   HDL 74 05/18/2020 1322   HDL 72 08/24/2014 1230   CHOLHDL 2.0 05/18/2020 1322   CHOLHDL 2.7 03/25/2013 1255   VLDL 12 03/25/2013 1255   LDLCALC 63 05/18/2020 1322   LDLCALC 79 08/21/2013 0838    Other Studies Reviewed Today:  Echocardiogram 02/20/2013: Study Conclusions  - Left ventricle: The cavity size was normal. Wall thickness was increased in a pattern of mild LVH. There was mild concentric hypertrophy. Systolic function was normal. The estimated ejection fraction was in the range of 60% to 65%. Wall motion was normal; there were no regional wall motion abnormalities. Doppler parameters are consistent with abnormal left ventricular relaxation (grade 1 diastolic dysfunction). - Aortic valve: Trileaflet; mildly thickened, mildly calcified leaflets. Mild regurgitation. Mean gradient: 42mm Hg (S). Valve area: 2.4cm^2(VTI). - Mitral valve: Mildly to moderately thickened leaflets - appear myxomatous. Mild prolapse, involving the anterior leaflet and the posterior leaflet. Moderate to severe regurgitation directed eccentrically. Unable to quantify more objectively based on limited interrogation. - Left atrium: The atrium was moderately dilated. - Right atrium: The atrium was  mildly dilated. Central venous pressure: 61mm Hg (est). - Tricuspid valve: Mild regurgitation. - Pulmonic valve: Mild regurgitation directed eccentrically. - Pulmonary arteries: PA peak pressure: 76mm Hg (S). PA pressure: 27mm Hg (ED). - Pericardium, extracardiac: There was no pericardial effusion.  Assessment and Plan:  1.  Mitral valve prolapse with history of moderate to severe eccentric mitral regurgitation, following conservatively.  She does not report any increasing leg swelling, no orthopnea or PND.  Maintains basic ADLs at home with assistance from her daughter.  She is on HCTZ as part of her antihypertensive regimen.  2.  Essential hypertension, currently on Norvasc, Cozaar, and HCTZ.  Keep follow-up with PCP.  Blood pressure low normal range today.  Could consider cutting back on Norvasc dose if this trend continues.  Medication Adjustments/Labs and Tests Ordered: Current medicines are reviewed at length with the patient today.  Concerns regarding medicines are outlined above.   Tests Ordered: No orders of the defined  types were placed in this encounter.   Medication Changes: No orders of the defined types were placed in this encounter.   Disposition:  Follow up 6 months in a in office.  Signed, Satira Sark, MD, [facility] 08/04/2020 1:18 PM    [facility] [place], [place],  [address] Phone: [phone]; Fax: [phone]

## 2020-08-04 NOTE — Patient Instructions (Addendum)

## 2020-08-17 ENCOUNTER — Ambulatory Visit: Payer: Medicare Other | Admitting: Family Medicine

## 2020-08-19 ENCOUNTER — Other Ambulatory Visit: Payer: Self-pay

## 2020-08-19 ENCOUNTER — Encounter: Payer: Self-pay | Admitting: Family Medicine

## 2020-08-19 ENCOUNTER — Ambulatory Visit (INDEPENDENT_AMBULATORY_CARE_PROVIDER_SITE_OTHER): Payer: Medicare Other | Admitting: Family Medicine

## 2020-08-19 VITALS — BP 149/77 | HR 84 | Temp 97.9°F | Resp 20 | Ht 60.0 in | Wt 91.1 lb

## 2020-08-19 DIAGNOSIS — Z23 Encounter for immunization: Secondary | ICD-10-CM | POA: Diagnosis not present

## 2020-08-19 DIAGNOSIS — I10 Essential (primary) hypertension: Secondary | ICD-10-CM | POA: Diagnosis not present

## 2020-08-19 DIAGNOSIS — E039 Hypothyroidism, unspecified: Secondary | ICD-10-CM | POA: Diagnosis not present

## 2020-08-19 DIAGNOSIS — E782 Mixed hyperlipidemia: Secondary | ICD-10-CM

## 2020-08-19 DIAGNOSIS — J431 Panlobular emphysema: Secondary | ICD-10-CM | POA: Diagnosis not present

## 2020-08-19 DIAGNOSIS — E559 Vitamin D deficiency, unspecified: Secondary | ICD-10-CM | POA: Diagnosis not present

## 2020-08-19 NOTE — Progress Notes (Signed)
Subjective:  Patient ID: Brianna [name], female    DOB: 03/26/1925  Age: 86 y.o. MRN: [id number]  CC: Medical Management of Chronic Issues   HPI Brianna [name] presents for follow up of multiple chronic illnesses. She is a resident of [facility]. She is followed for COPD. Staff here today state they havre not noted any dyspnea recently. BP stable at the facility. Checked routinely. She gets partial relief of arthritis with voltaren gel.   follow-up on  thyroid. The patient has a history of hypothyroidism for many years. No reported constipation and diarrhea. No myxedema. Medication is as noted below. Verified that pt is taking it daily on an empty stomach. Well tolerated. Pt. Is demented and gives extremely limited history, most is from staff members of [facility].   Depression screen [facility] 2/9 08/19/2020 05/18/2020 02/17/2020  Decreased Interest 0 0 0  Down, Depressed, Hopeless 0 0 0  PHQ - 2 Score 0 0 0  Altered sleeping - - -  Tired, decreased energy - - -  Change in appetite - - -  Feeling bad or failure about yourself  - - -  Trouble concentrating - - -  Moving slowly or fidgety/restless - - -  Suicidal thoughts - - -  PHQ-9 Score - - -    History Keasia has a past medical history of Bilateral inguinal hernia (BIH), Carpal tunnel syndrome on right, Colon polyp, COPD (chronic obstructive pulmonary disease) ([place]), Essential hypertension, Fibrocystic breast, Frequency of urination, History of skin cancer, Hypothyroidism, Lumbosacral spondylosis without myelopathy, Mixed hyperlipidemia, Myxomatous mitral valve, Obturator hernia, right, Osteoporosis, 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   She has a past surgical history that includes Cesarean section; Appendectomy; Cataract extraction w/ intraocular lens  implant, bilateral; Colon surgery (2004); Ventral hernia repair (11/21/2011); laparoscopy (11/21/2011); Inguinal hernia  repair (11/21/2011); Tonsillectomy; and Total hip arthroplasty (Right, 03/04/2013).   Her family history includes Heart disease in her father and mother; Lung cancer in her brother.She reports that she has never smoked. She has never used smokeless tobacco. She reports that she does not drink alcohol and does not use drugs.    ROS Review of Systems  Unable to perform ROS: Dementia    Objective:  BP (!) 149/77   Pulse 84   Temp 97.9 F (36.6 C) (Temporal)   Resp 20   Ht 5' (1.524 m)   Wt 91 lb 2 oz (41.3 kg)   SpO2 97%   BMI 17.80 kg/m   BP Readings from Last 3 Encounters:  08/19/20 (!) 149/77  08/04/20 108/68  05/18/20 126/78    Wt Readings from Last 3 Encounters:  08/19/20 91 lb 2 oz (41.3 kg)  08/04/20 90 lb (40.8 kg)  05/18/20 89 lb 6 oz (40.5 kg)     Physical Exam Constitutional:      General: She is not in acute distress.    Appearance: She is well-developed.     Comments: frail  HENT:     Head: Normocephalic and atraumatic.  Eyes:     Conjunctiva/sclera: Conjunctivae normal.     Pupils: Pupils are equal, round, and reactive to light.  Neck:     Thyroid: No thyromegaly.  Cardiovascular:     Rate and Rhythm: Normal rate and regular rhythm.     Heart sounds: Normal heart sounds. No murmur heard.   Pulmonary:     Effort: Pulmonary effort is normal. No respiratory distress.  Breath sounds: Normal breath sounds. No wheezing or rales.  Abdominal:     General: Bowel sounds are normal. There is no distension.     Palpations: Abdomen is soft.     Tenderness: There is no abdominal tenderness.  Musculoskeletal:        General: Normal range of motion.     Cervical back: Normal range of motion and neck supple.  Lymphadenopathy:     Cervical: No cervical adenopathy.  Skin:    General: Skin is warm and dry.  Neurological:     Mental Status: She is alert and oriented to person, place, and time.  Psychiatric:        Behavior: Behavior normal.        Thought  Content: Thought content normal.        Judgment: Judgment normal.       Assessment & Plan:   Kem was seen today for medical management of chronic issues.  Diagnoses and all orders for this visit:  Essential hypertension, benign -     CBC with Differential/Platelet -     CMP14+EGFR  Panlobular emphysema (HCC) -     CBC with Differential/Platelet -     CMP14+EGFR  Mixed hyperlipidemia -     CBC with Differential/Platelet -     CMP14+EGFR -     Lipid panel  Vitamin D deficiency -     VITAMIN D 25 Hydroxy (Vit-D Deficiency, Fractures)  Acquired hypothyroidism -     CBC with Differential/Platelet -     CMP14+EGFR -     TSH + free T4  Need for immunization against influenza -     Flu Vaccine QUAD High Dose(Fluad)       I am having Brianna [name] maintain her Vitamin D3, beta carotene w/minerals, Calcium Citrate-Vitamin D (CITRACAL + D PO), Omega-3 Fatty Acids (FISH OIL PO), levothyroxine, amLODipine, atorvastatin, diclofenac Sodium, hydrochlorothiazide, and losartan.  Allergies as of 08/19/2020      Reactions   Alendronate Sodium Nausea Only   Lodine [etodolac]    unknown   Risedronate Sodium    unknown      Medication List       Accurate as of August 19, 2020 11:59 PM. If you have any questions, ask your nurse or doctor.        amLODipine 5 MG tablet Commonly known as: NORVASC Take 1 tablet (5 mg total) by mouth daily. for blood pressure   atorvastatin 10 MG tablet Commonly known as: LIPITOR Take 0.5 tablets (5 mg total) by mouth at bedtime.   beta carotene w/minerals tablet Take 1 tablet by mouth daily.   CITRACAL + D PO Take 2 tablets by mouth daily.   diclofenac Sodium 1 % Gel Commonly known as: Voltaren Apply 4 g topically 4 (four) times daily.   FISH OIL PO Take 1 tablet by mouth daily.   hydrochlorothiazide 12.5 MG capsule Commonly known as: MICROZIDE TAKE ONE (1) CAPSULE EACH DAY   levothyroxine 25 MCG tablet Commonly known  as: SYNTHROID TAKE 2 TABLETS MONDAY-FRIDAY AND TAKE 1 TABLET SATURDAY & SUNDAY   losartan 100 MG tablet Commonly known as: COZAAR TAKE ONE (1) TABLET EACH DAY   Vitamin D3 50 MCG (2000 UT) Tabs Take 1 tablet by mouth daily.        Follow-up: Return in about 3 months (around 11/19/2020).  Claretta Fraise, M.D.

## 2020-08-20 LAB — CMP14+EGFR
ALT: 17 IU/L (ref 0–32)
AST: 25 IU/L (ref 0–40)
Albumin/Globulin Ratio: 1.5 (ref 1.2–2.2)
Albumin: 4.2 g/dL (ref 3.5–4.6)
Alkaline Phosphatase: 81 IU/L (ref 44–121)
BUN/Creatinine Ratio: 26 (ref 12–28)
BUN: 18 mg/dL (ref 10–36)
Bilirubin Total: 0.4 mg/dL (ref 0.0–1.2)
CO2: 25 mmol/L (ref 20–29)
Calcium: 9.9 mg/dL (ref 8.7–10.3)
Chloride: 91 mmol/L — ABNORMAL LOW (ref 96–106)
Creatinine, Ser: 0.7 mg/dL (ref 0.57–1.00)
GFR calc Af Amer: 86 mL/min/{1.73_m2} (ref >59)
GFR calc non Af Amer: 75 mL/min/{1.73_m2} (ref >59)
Globulin, Total: 2.8 g/dL (ref 1.5–4.5)
Glucose: 93 mg/dL (ref 65–99)
Potassium: 4.1 mmol/L (ref 3.5–5.2)
Sodium: 130 mmol/L — ABNORMAL LOW (ref 134–144)
Total Protein: 7 g/dL (ref 6.0–8.5)

## 2020-08-20 LAB — CBC WITH DIFFERENTIAL/PLATELET
Basophils Absolute: 0 10*3/uL (ref 0.0–0.2)
Basos: 1 %
EOS (ABSOLUTE): 0 10*3/uL (ref 0.0–0.4)
Eos: 1 %
Hematocrit: 36.8 % (ref 34.0–46.6)
Hemoglobin: 12.5 g/dL (ref 11.1–15.9)
Immature Grans (Abs): 0 10*3/uL (ref 0.0–0.1)
Immature Granulocytes: 0 %
Lymphocytes Absolute: 1.2 10*3/uL (ref 0.7–3.1)
Lymphs: 32 %
MCH: 32.2 pg (ref 26.6–33.0)
MCHC: 34 g/dL (ref 31.5–35.7)
MCV: 95 fL (ref 79–97)
Monocytes Absolute: 0.6 10*3/uL (ref 0.1–0.9)
Monocytes: 15 %
Neutrophils Absolute: 1.9 10*3/uL (ref 1.4–7.0)
Neutrophils: 51 %
Platelets: 208 10*3/uL (ref 150–450)
RBC: 3.88 x10E6/uL (ref 3.77–5.28)
RDW: 12.8 % (ref 11.7–15.4)
WBC: 3.7 10*3/uL (ref 3.4–10.8)

## 2020-08-20 LAB — LIPID PANEL
Chol/HDL Ratio: 2 ratio (ref 0.0–4.4)
Cholesterol, Total: 188 mg/dL (ref 100–199)
HDL: 96 mg/dL (ref >39)
LDL Chol Calc (NIH): 85 mg/dL (ref 0–99)
Triglycerides: 33 mg/dL (ref 0–149)
VLDL Cholesterol Cal: 7 mg/dL (ref 5–40)

## 2020-08-20 LAB — TSH+FREE T4
Free T4: 1.76 ng/dL (ref 0.82–1.77)
TSH: 1.56 u[IU]/mL (ref 0.450–4.500)

## 2020-08-20 LAB — VITAMIN D 25 HYDROXY (VIT D DEFICIENCY, FRACTURES): Vit D, 25-Hydroxy: 41.5 ng/mL (ref 30.0–100.0)

## 2020-08-22 ENCOUNTER — Encounter: Payer: Self-pay | Admitting: Family Medicine

## 2020-08-23 NOTE — Progress Notes (Signed)
Hello [place],  Your lab result is normal and/or stable.Some minor variations that are not significant are commonly marked abnormal, but do not represent any medical problem for you.  Best regards, Claretta Fraise, M.D.

## 2020-09-02 ENCOUNTER — Telehealth: Payer: Self-pay | Admitting: *Deleted

## 2020-09-02 NOTE — Telephone Encounter (Signed)
PA in process for Diclofenac (Key: [id number])  Your information has been submitted to [place]. [employer] [place] will review the request and notify you of the determination decision directly, typically within 3 business days of your submission and once all necessary information is received.  You will also receive your request decision electronically. To check for an update later, open the request again from your dashboard.  If [employer] has not responded within the specified timeframe or if you have any questions about your PA submission, contact [place] directly at [facility]) [phone] or ([place]) [phone].

## 2020-09-02 NOTE — Telephone Encounter (Signed)
Approved today Effective from 09/02/2020 through 09/02/2021 [facility] store aware

## 2020-09-03 ENCOUNTER — Telehealth: Payer: Self-pay | Admitting: Family Medicine

## 2020-09-03 NOTE — Telephone Encounter (Signed)
Daughter aware PA was completed and approved

## 2020-09-03 NOTE — Telephone Encounter (Signed)
Pt daughter stated that insurance would not pay for the arthritis gel, said they were able to get it approved before and still have the same insurance

## 2020-09-14 ENCOUNTER — Other Ambulatory Visit: Payer: Self-pay | Admitting: Family Medicine

## 2020-09-14 DIAGNOSIS — E039 Hypothyroidism, unspecified: Secondary | ICD-10-CM

## 2020-10-15 ENCOUNTER — Other Ambulatory Visit: Payer: Self-pay | Admitting: Family Medicine

## 2020-10-15 DIAGNOSIS — I10 Essential (primary) hypertension: Secondary | ICD-10-CM

## 2020-10-19 ENCOUNTER — Encounter: Payer: Self-pay | Admitting: *Deleted

## 2020-11-18 ENCOUNTER — Ambulatory Visit: Payer: Medicare Other | Admitting: Family Medicine

## 2020-11-29 ENCOUNTER — Other Ambulatory Visit: Payer: Medicare Other

## 2020-12-09 ENCOUNTER — Ambulatory Visit: Payer: Medicare Other | Admitting: Family Medicine

## 2020-12-10 ENCOUNTER — Other Ambulatory Visit: Payer: Self-pay | Admitting: Family Medicine

## 2020-12-10 DIAGNOSIS — I10 Essential (primary) hypertension: Secondary | ICD-10-CM

## 2020-12-20 ENCOUNTER — Ambulatory Visit (INDEPENDENT_AMBULATORY_CARE_PROVIDER_SITE_OTHER): Payer: Medicare Other | Admitting: Family Medicine

## 2020-12-20 ENCOUNTER — Other Ambulatory Visit: Payer: Self-pay | Admitting: Family Medicine

## 2020-12-20 ENCOUNTER — Other Ambulatory Visit: Payer: Self-pay

## 2020-12-20 ENCOUNTER — Encounter: Payer: Self-pay | Admitting: Family Medicine

## 2020-12-20 VITALS — BP 136/73 | HR 83 | Temp 98.0°F | Resp 20 | Ht 60.0 in | Wt 91.4 lb

## 2020-12-20 DIAGNOSIS — E782 Mixed hyperlipidemia: Secondary | ICD-10-CM | POA: Diagnosis not present

## 2020-12-20 DIAGNOSIS — I10 Essential (primary) hypertension: Secondary | ICD-10-CM

## 2020-12-20 DIAGNOSIS — J431 Panlobular emphysema: Secondary | ICD-10-CM | POA: Diagnosis not present

## 2020-12-20 DIAGNOSIS — E7800 Pure hypercholesterolemia, unspecified: Secondary | ICD-10-CM

## 2020-12-20 DIAGNOSIS — E039 Hypothyroidism, unspecified: Secondary | ICD-10-CM | POA: Diagnosis not present

## 2020-12-20 NOTE — Progress Notes (Signed)
Subjective:  Patient ID: Brianna [name], female    DOB: 09-28-1925  Age: 87 y.o. MRN: [id number]  CC: Medical Management of Chronic Issues   HPI Brianna [name] presents for follow-up of hypertension. Patient has no history of headache chest pain or shortness of breath or recent cough. Patient also denies symptoms of TIA such as numbness weakness lateralizing. Patient checks  blood pressure at home and has not had any elevated readings recently. Patient denies side effects from his medication. States taking it regularly. Patient presents for follow-up on  thyroid. The patient has a history of hypothyroidism for many years. It has been stable recently. Pt. denies any change in  voice, loss of hair, heat or cold intolerance. Energy level has been adequate to good. Patient denies constipation and diarrhea. No myxedema. Medication is as noted below. Verified that pt is taking it daily on an empty stomach. Well tolerated.  Patient also has chronic anemia.  She denies symptoms referable to being cold or lacking in energy.    Depression screen [facility] 2/9 12/20/2020 08/19/2020 05/18/2020  Decreased Interest 0 0 0  Down, Depressed, Hopeless 0 0 0  PHQ - 2 Score 0 0 0  Altered sleeping - - -  Tired, decreased energy - - -  Change in appetite - - -  Feeling bad or failure about yourself  - - -  Trouble concentrating - - -  Moving slowly or fidgety/restless - - -  Suicidal thoughts - - -  PHQ-9 Score - - -    History Brianna [name] has a past medical history of Bilateral inguinal hernia (BIH), Carpal tunnel syndrome on right, Colon polyp, COPD (chronic obstructive pulmonary disease) ([place]), Essential hypertension, Fibrocystic breast, Frequency of urination, History of skin cancer, Hypothyroidism, Lumbosacral spondylosis without myelopathy, Mixed hyperlipidemia, Myxomatous mitral valve, Obturator hernia, right, Osteoporosis, 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   She has a past surgical history that includes Cesarean section; Appendectomy; Cataract extraction w/ intraocular lens  implant, bilateral; Colon surgery (2004); Ventral hernia repair (11/21/2011); laparoscopy (11/21/2011); Inguinal hernia repair (11/21/2011); Tonsillectomy; and Total hip arthroplasty (Right, 03/04/2013).   Her family history includes Heart disease in her father and mother; Lung cancer in her brother.She reports that she has never smoked. She has never used smokeless tobacco. She reports that she does not drink alcohol and does not use drugs.    ROS Review of Systems  Constitutional: Negative.   HENT: Positive for hearing loss (Chronic bilateral presbycusis.  Her daughter who is with her says she probably will not go to an audiologist if I referred her.).   Eyes: Negative for visual disturbance.  Respiratory: Negative for shortness of breath.   Cardiovascular: Negative for chest pain.  Gastrointestinal: Negative for abdominal pain.  Musculoskeletal: Negative for arthralgias.    Objective:  BP 136/73   Pulse 83   Temp 98 F (36.7 C) (Temporal)   Resp 20   Ht 5' (1.524 m)   Wt 91 lb 6 oz (41.4 kg)   SpO2 95%   BMI 17.85 kg/m   BP Readings from Last 3 Encounters:  12/20/20 136/73  08/19/20 (!) 149/77  08/04/20 108/68    Wt Readings from Last 3 Encounters:  12/20/20 91 lb 6 oz (41.4 kg)  08/19/20 91 lb 2 oz (41.3 kg)  08/04/20 90 lb (40.8 kg)     Physical Exam Constitutional:      General: She is not in acute  distress.    Appearance: She is well-developed.  HENT:     Head: Normocephalic and atraumatic.  Eyes:     Conjunctiva/sclera: Conjunctivae normal.     Pupils: Pupils are equal, round, and reactive to light.  Neck:     Thyroid: No thyromegaly.  Cardiovascular:     Rate and Rhythm: Normal rate and regular rhythm.     Heart sounds: Normal heart sounds. No murmur heard.   Pulmonary:     Effort: Pulmonary effort is normal. No  respiratory distress.     Breath sounds: Normal breath sounds. No wheezing or rales.  Abdominal:     General: Bowel sounds are normal. There is no distension.     Palpations: Abdomen is soft.     Tenderness: There is no abdominal tenderness.  Musculoskeletal:        General: Normal range of motion.     Cervical back: Normal range of motion and neck supple.  Lymphadenopathy:     Cervical: No cervical adenopathy.  Skin:    General: Skin is warm and dry.  Neurological:     Mental Status: She is alert and oriented to person, place, and time.  Psychiatric:        Behavior: Behavior normal.        Thought Content: Thought content normal.        Judgment: Judgment normal.       Assessment & Plan:   Brianna [name] was seen today for medical management of chronic issues.  Diagnoses and all orders for this visit:  Panlobular emphysema ([place]) -     CBC with Differential/Platelet -     BMP8+EGFR  Essential hypertension, benign -     CBC with Differential/Platelet -     BMP8+EGFR  Mixed hyperlipidemia -     CBC with Differential/Platelet -     BMP8+EGFR  Acquired hypothyroidism -     TSH + free T4 -     CBC with Differential/Platelet -     BMP8+EGFR       I am having Brianna [name] maintain her Vitamin D3, beta carotene w/minerals, Calcium Citrate-Vitamin D (CITRACAL + D PO), Omega-3 Fatty Acids (FISH OIL PO), atorvastatin, diclofenac Sodium, hydrochlorothiazide, levothyroxine, losartan, and amLODipine.  Allergies as of 12/20/2020      Reactions   Alendronate Sodium Nausea Only   Lodine [etodolac]    unknown   Risedronate Sodium    unknown      Medication List       Accurate as of December 20, 2020  5:53 PM. If you have any questions, ask your nurse or doctor.        amLODipine 5 MG tablet Commonly known as: NORVASC TAKE 1 TABLET DAILY FOR BLOOD PRESSURE   atorvastatin 10 MG tablet Commonly known as: LIPITOR Take 0.5 tablets (5 mg total) by mouth at bedtime.    beta carotene w/minerals tablet Take 1 tablet by mouth daily.   CITRACAL + D PO Take 2 tablets by mouth daily.   diclofenac Sodium 1 % Gel Commonly known as: Voltaren Apply 4 g topically 4 (four) times daily.   FISH OIL PO Take 1 tablet by mouth daily.   hydrochlorothiazide 12.5 MG capsule Commonly known as: MICROZIDE TAKE ONE (1) CAPSULE EACH DAY   levothyroxine 25 MCG tablet Commonly known as: SYNTHROID TAKE 2 TABLETS DAILY MONDAY-FRIDAY & TAKE 1 TABLET DAILY SATURDAY & SUNDAY   losartan 100 MG tablet Commonly known as: COZAAR TAKE ONE (1) TABLET  EACH DAY   Vitamin D3 50 MCG (2000 UT) Tabs Take 1 tablet by mouth daily.        Follow-up: Return in about 3 months (around 03/19/2021) for COPD.  Brianna [name], M.D.

## 2020-12-21 LAB — BMP8+EGFR
BUN/Creatinine Ratio: 27 (ref 12–28)
BUN: 22 mg/dL (ref 10–36)
CO2: 24 mmol/L (ref 20–29)
Calcium: 9.6 mg/dL (ref 8.7–10.3)
Chloride: 95 mmol/L — ABNORMAL LOW (ref 96–106)
Creatinine, Ser: 0.81 mg/dL (ref 0.57–1.00)
GFR calc Af Amer: 72 mL/min/{1.73_m2} (ref >59)
GFR calc non Af Amer: 62 mL/min/{1.73_m2} (ref >59)
Glucose: 91 mg/dL (ref 65–99)
Potassium: 4.4 mmol/L (ref 3.5–5.2)
Sodium: 133 mmol/L — ABNORMAL LOW (ref 134–144)

## 2020-12-21 LAB — CBC WITH DIFFERENTIAL/PLATELET
Basophils Absolute: 0 10*3/uL (ref 0.0–0.2)
Basos: 1 %
EOS (ABSOLUTE): 0 10*3/uL (ref 0.0–0.4)
Eos: 1 %
Hematocrit: 35.9 % (ref 34.0–46.6)
Hemoglobin: 12 g/dL (ref 11.1–15.9)
Immature Grans (Abs): 0 10*3/uL (ref 0.0–0.1)
Immature Granulocytes: 0 %
Lymphocytes Absolute: 1.3 10*3/uL (ref 0.7–3.1)
Lymphs: 32 %
MCH: 31.6 pg (ref 26.6–33.0)
MCHC: 33.4 g/dL (ref 31.5–35.7)
MCV: 95 fL (ref 79–97)
Monocytes Absolute: 0.7 10*3/uL (ref 0.1–0.9)
Monocytes: 16 %
Neutrophils Absolute: 2.1 10*3/uL (ref 1.4–7.0)
Neutrophils: 50 %
Platelets: 197 10*3/uL (ref 150–450)
RBC: 3.8 x10E6/uL (ref 3.77–5.28)
RDW: 12.6 % (ref 11.7–15.4)
WBC: 4.1 10*3/uL (ref 3.4–10.8)

## 2020-12-21 LAB — TSH+FREE T4
Free T4: 1.53 ng/dL (ref 0.82–1.77)
TSH: 2.09 u[IU]/mL (ref 0.450–4.500)

## 2020-12-21 NOTE — Progress Notes (Signed)
Hello [place],  Your lab result is normal and/or stable.Some minor variations that are not significant are commonly marked abnormal, but do not represent any medical problem for you.  Best regards, Claretta Fraise, M.D.

## 2020-12-28 DIAGNOSIS — L84 Corns and callosities: Secondary | ICD-10-CM | POA: Diagnosis not present

## 2020-12-28 DIAGNOSIS — I70203 Unspecified atherosclerosis of native arteries of extremities, bilateral legs: Secondary | ICD-10-CM | POA: Diagnosis not present

## 2020-12-28 DIAGNOSIS — M79676 Pain in unspecified toe(s): Secondary | ICD-10-CM | POA: Diagnosis not present

## 2020-12-28 DIAGNOSIS — B351 Tinea unguium: Secondary | ICD-10-CM | POA: Diagnosis not present

## 2021-01-09 ENCOUNTER — Other Ambulatory Visit: Payer: Self-pay | Admitting: Family Medicine

## 2021-01-09 DIAGNOSIS — I10 Essential (primary) hypertension: Secondary | ICD-10-CM

## 2021-03-14 ENCOUNTER — Other Ambulatory Visit: Payer: Self-pay | Admitting: Family Medicine

## 2021-03-14 DIAGNOSIS — I10 Essential (primary) hypertension: Secondary | ICD-10-CM

## 2021-03-21 ENCOUNTER — Ambulatory Visit (INDEPENDENT_AMBULATORY_CARE_PROVIDER_SITE_OTHER): Payer: Medicare Other | Admitting: Family Medicine

## 2021-03-21 ENCOUNTER — Other Ambulatory Visit: Payer: Self-pay | Admitting: Family Medicine

## 2021-03-21 ENCOUNTER — Other Ambulatory Visit: Payer: Self-pay

## 2021-03-21 ENCOUNTER — Encounter: Payer: Self-pay | Admitting: Family Medicine

## 2021-03-21 VITALS — BP 141/68 | HR 80 | Temp 97.1°F | Ht 60.0 in | Wt 92.4 lb

## 2021-03-21 DIAGNOSIS — E782 Mixed hyperlipidemia: Secondary | ICD-10-CM | POA: Diagnosis not present

## 2021-03-21 DIAGNOSIS — E039 Hypothyroidism, unspecified: Secondary | ICD-10-CM

## 2021-03-21 DIAGNOSIS — M810 Age-related osteoporosis without current pathological fracture: Secondary | ICD-10-CM

## 2021-03-21 DIAGNOSIS — M1990 Unspecified osteoarthritis, unspecified site: Secondary | ICD-10-CM | POA: Diagnosis not present

## 2021-03-21 DIAGNOSIS — I10 Essential (primary) hypertension: Secondary | ICD-10-CM | POA: Diagnosis not present

## 2021-03-21 DIAGNOSIS — I700 Atherosclerosis of aorta: Secondary | ICD-10-CM

## 2021-03-21 DIAGNOSIS — J432 Centrilobular emphysema: Secondary | ICD-10-CM | POA: Diagnosis not present

## 2021-03-21 DIAGNOSIS — E7800 Pure hypercholesterolemia, unspecified: Secondary | ICD-10-CM

## 2021-03-21 NOTE — Progress Notes (Signed)
Subjective:  Patient ID: Brianna [name], female    DOB: 07/27/1925  Age: 87 y.o. MRN: [id number]  CC: Medical Management of Chronic Issues   HPI Brianna [name] presents for  follow-up of hypertension. Patient has no history of headache chest pain or shortness of breath or recent cough. Patient also denies symptoms of TIA such as focal numbness or weakness. Patient denies side effects from medication. States taking it regularly.   in for follow-up of elevated cholesterol. Doing well without complaints on current medication. Denies side effects of statin including myalgia and arthralgia and nausea. Currently no chest pain, shortness of breath or other cardiovascular related symptoms noted.   follow-up on  thyroid. The patient has a history of hypothyroidism for many years. It has been stable recently. Pt. denies any change in  voice, loss of hair, heat or cold intolerance. Energy level has been adequate to good. Patient denies constipation and diarrhea. No myxedema. Medication is as noted below. Verified that pt is taking it daily on an empty stomach. Well tolerated.   Lots of arthritis pain. Worst in hands Nd knees. She wraps bandaids around the DIPs & PIPs of the worst ones for relief. Daughter says she will probably not take an NSAID.   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   She has a past surgical history that includes Cesarean section; Appendectomy; Cataract extraction w/ intraocular lens  implant, bilateral; Colon surgery  (2004); Ventral hernia repair (11/21/2011); laparoscopy (11/21/2011); Inguinal hernia repair (11/21/2011); Tonsillectomy; and Total hip arthroplasty (Right, 03/04/2013).   Her family history includes Heart disease in her father and mother; Lung cancer in her brother.She reports that she has never smoked. She has never used smokeless tobacco. She reports that she does not drink alcohol and does not use drugs.  Current Outpatient Medications on File Prior to Visit  Medication Sig Dispense Refill  . amLODipine (NORVASC) 5 MG tablet TAKE 1 TABLET DAILY FOR BLOOD PRESSURE 90 tablet 0  . atorvastatin (LIPITOR) 10 MG tablet TAKE 1/2 TABLET AT BEDTIME 45 tablet 0  . beta carotene w/minerals (OCUVITE) tablet Take 1 tablet by mouth daily.    . Calcium Citrate-Vitamin D (CITRACAL + D PO) Take 2 tablets by mouth daily.     . Cholecalciferol (VITAMIN D3) 2000 UNITS TABS Take 1 tablet by mouth daily.    . diclofenac Sodium (VOLTAREN) 1 % GEL Apply 4 g topically 4 (four) times daily. 480 g 5  . hydrochlorothiazide (MICROZIDE) 12.5 MG capsule TAKE ONE (1) CAPSULE EACH DAY 90 capsule 0  . levothyroxine (SYNTHROID) 25 MCG tablet TAKE 2 TABLETS DAILY MONDAY-FRIDAY & TAKE 1 TABLET DAILY SATURDAY & SUNDAY 154 tablet 3  . losartan (COZAAR) 100 MG tablet TAKE ONE (1) TABLET EACH DAY 90 tablet 0  . Omega-3 Fatty Acids (FISH OIL PO) Take 1 tablet by mouth daily.     No current facility-administered medications on file prior to visit.    ROS Review of Systems  Constitutional: Negative.   HENT: Negative.   Eyes: Negative for visual disturbance.  Respiratory: Negative for shortness of breath.   Cardiovascular: Negative for chest pain.  Gastrointestinal: Negative for  abdominal pain.  Musculoskeletal: Positive for arthralgias.  Psychiatric/Behavioral:       Getting more forgetful. Has word-finding problems.      Objective:  BP (!) 141/68   Pulse 80   Temp (!) 97.1 F (36.2 C)   Ht 5' (1.524 m)   Wt 92 lb 6.4 oz  (41.9 kg)   SpO2 100%   BMI 18.05 kg/m   BP Readings from Last 3 Encounters:  03/21/21 (!) 141/68  12/20/20 136/73  08/19/20 (!) 149/77    Wt Readings from Last 3 Encounters:  03/21/21 92 lb 6.4 oz (41.9 kg)  12/20/20 91 lb 6 oz (41.4 kg)  08/19/20 91 lb 2 oz (41.3 kg)     Physical Exam Constitutional:      General: She is not in acute distress.    Appearance: She is well-developed.  HENT:     Head: Normocephalic and atraumatic.  Eyes:     Conjunctiva/sclera: Conjunctivae normal.     Pupils: Pupils are equal, round, and reactive to light.  Neck:     Thyroid: No thyromegaly.  Cardiovascular:     Rate and Rhythm: Normal rate and regular rhythm.     Heart sounds: Normal heart sounds. No murmur heard.   Pulmonary:     Effort: Pulmonary effort is normal. No respiratory distress.     Breath sounds: Normal breath sounds. No wheezing or rales.  Abdominal:     General: Bowel sounds are normal. There is no distension.     Palpations: Abdomen is soft.     Tenderness: There is no abdominal tenderness.  Musculoskeletal:        General: Deformity (Marked hypertrophy of multiple finger joint MCP, PIP & DIP's) present. Normal range of motion.     Cervical back: Normal range of motion and neck supple.  Lymphadenopathy:     Cervical: No cervical adenopathy.  Skin:    General: Skin is warm and dry.  Neurological:     Mental Status: She is alert and oriented to person, place, and time.  Psychiatric:        Behavior: Behavior normal.        Thought Content: Thought content normal.        Judgment: Judgment normal.       Assessment & Plan:   Brianna [name] was seen today for medical management of chronic issues.  Diagnoses and all orders for this visit:  Arthritis  Centrilobular emphysema ([place])  Essential hypertension, benign  Mixed hyperlipidemia  Acquired hypothyroidism  Osteoporosis, post-menopausal  Thoracic aortic atherosclerosis (HCC)   Allergies as of 03/21/2021       Reactions   Alendronate Sodium Nausea Only   Lodine [etodolac]    unknown   Risedronate Sodium    unknown      Medication List       Accurate as of Mar 21, 2021  1:53 PM. If you have any questions, ask your nurse or doctor.        amLODipine 5 MG tablet Commonly known as: NORVASC TAKE 1 TABLET DAILY FOR BLOOD PRESSURE   atorvastatin 10 MG tablet Commonly known as: LIPITOR TAKE 1/2 TABLET AT BEDTIME   beta carotene w/minerals tablet Take 1 tablet by mouth daily.   CITRACAL + D PO Take 2 tablets by mouth daily.   diclofenac Sodium 1 % Gel Commonly known as: Voltaren Apply 4 g topically 4 (four) times daily.   FISH OIL PO Take 1 tablet by mouth daily.   hydrochlorothiazide  12.5 MG capsule Commonly known as: MICROZIDE TAKE ONE (1) CAPSULE EACH DAY   levothyroxine 25 MCG tablet Commonly known as: SYNTHROID TAKE 2 TABLETS DAILY MONDAY-FRIDAY & TAKE 1 TABLET DAILY SATURDAY & SUNDAY   losartan 100 MG tablet Commonly known as: COZAAR TAKE ONE (1) TABLET EACH DAY   Vitamin D3 50 MCG (2000 UT) Tabs Take 1 tablet by mouth daily.       No orders of the defined types were placed in this encounter.     Follow-up: Return in about 3 months (around 06/21/2021).  Claretta Fraise, M.D.

## 2021-04-04 ENCOUNTER — Other Ambulatory Visit: Payer: Self-pay | Admitting: Family Medicine

## 2021-04-04 DIAGNOSIS — I10 Essential (primary) hypertension: Secondary | ICD-10-CM

## 2021-04-11 ENCOUNTER — Other Ambulatory Visit: Payer: Self-pay | Admitting: Family Medicine

## 2021-04-11 DIAGNOSIS — I10 Essential (primary) hypertension: Secondary | ICD-10-CM

## 2021-06-12 ENCOUNTER — Other Ambulatory Visit: Payer: Self-pay | Admitting: Family Medicine

## 2021-06-12 DIAGNOSIS — I10 Essential (primary) hypertension: Secondary | ICD-10-CM

## 2021-06-19 ENCOUNTER — Other Ambulatory Visit: Payer: Self-pay | Admitting: Family Medicine

## 2021-06-19 DIAGNOSIS — E7800 Pure hypercholesterolemia, unspecified: Secondary | ICD-10-CM

## 2021-06-19 DIAGNOSIS — E039 Hypothyroidism, unspecified: Secondary | ICD-10-CM

## 2021-06-21 ENCOUNTER — Ambulatory Visit (INDEPENDENT_AMBULATORY_CARE_PROVIDER_SITE_OTHER): Payer: Medicare Other | Admitting: Family Medicine

## 2021-06-21 ENCOUNTER — Other Ambulatory Visit: Payer: Self-pay

## 2021-06-21 ENCOUNTER — Encounter: Payer: Self-pay | Admitting: Family Medicine

## 2021-06-21 VITALS — BP 140/75 | HR 92 | Temp 97.8°F | Ht 60.0 in | Wt 95.8 lb

## 2021-06-21 DIAGNOSIS — I10 Essential (primary) hypertension: Secondary | ICD-10-CM

## 2021-06-21 DIAGNOSIS — E7800 Pure hypercholesterolemia, unspecified: Secondary | ICD-10-CM | POA: Diagnosis not present

## 2021-06-21 DIAGNOSIS — E039 Hypothyroidism, unspecified: Secondary | ICD-10-CM | POA: Diagnosis not present

## 2021-06-21 DIAGNOSIS — E782 Mixed hyperlipidemia: Secondary | ICD-10-CM | POA: Diagnosis not present

## 2021-06-21 DIAGNOSIS — G301 Alzheimer's disease with late onset: Secondary | ICD-10-CM

## 2021-06-21 DIAGNOSIS — F0280 Dementia in other diseases classified elsewhere without behavioral disturbance: Secondary | ICD-10-CM

## 2021-06-21 MED ORDER — AMLODIPINE BESYLATE 5 MG PO TABS: 5.0000 mg | ORAL_TABLET | Freq: Every day | ORAL | 3 refills | Status: DC

## 2021-06-21 MED ORDER — HYDROCHLOROTHIAZIDE 12.5 MG PO CAPS: ORAL_CAPSULE | ORAL | 3 refills | Status: DC

## 2021-06-21 MED ORDER — LEVOTHYROXINE SODIUM 25 MCG PO TABS: ORAL_TABLET | ORAL | 3 refills | Status: DC

## 2021-06-21 MED ORDER — LOSARTAN POTASSIUM 100 MG PO TABS: ORAL_TABLET | ORAL | 3 refills | Status: DC

## 2021-06-21 MED ORDER — ATORVASTATIN CALCIUM 10 MG PO TABS: 5.0000 mg | ORAL_TABLET | Freq: Every day | ORAL | 3 refills | Status: DC

## 2021-06-21 NOTE — Progress Notes (Signed)
Subjective:  Patient ID: Brianna [name], female    DOB: 1924-11-16  Age: 87 y.o. MRN: [id number]  CC: Medical Management of Chronic Issues   HPI Brianna [name] presents for  follow-up of hypertension. Patient has no history of headache chest pain or shortness of breath or recent cough. Patient also denies symptoms of TIA such as focal numbness or weakness. Patient denies side effects from medication. States taking it regularly.   in for follow-up of elevated cholesterol. Doing well without complaints on current medication. Denies side effects of statin including myalgia and arthralgia and nausea. Currently no chest pain, shortness of breath or other cardiovascular related symptoms noted.   follow-up on  thyroid. It has been stable recently. Pt. denies any change in  voice, loss of hair, heat or cold intolerance. Energy level has been adequate  Patient denies constipation and diarrhea. No myxedema. Medication is as noted below. Verified that pt is taking it daily on an empty stomach. Well tolerated.  Brianna [name] lives alone.  Her daughter lives nearby.  She prepares her own cold breakfast.  She eats leftovers for lunch.  She eats dinner at her daughter's home.  Her daughter drives her to and from each evening.  Her daughter is with her today and helps with giving the history.  Brianna [name] daughter states that she does not use the stove.  She is able to dust and cleaning around the home on her own.  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   She has a past surgical history that includes Cesarean section; Appendectomy; Cataract extraction w/ intraocular lens  implant, bilateral; Colon surgery (2004); Ventral hernia repair (11/21/2011); laparoscopy (11/21/2011); Inguinal hernia repair (11/21/2011); Tonsillectomy; and Total hip arthroplasty (Right, 03/04/2013).   Her family history includes Heart disease in her father and mother; Lung cancer in her brother.She reports that she has never smoked. She has never used smokeless tobacco. She reports that she does not drink alcohol and does not use drugs.  Current Outpatient Medications on File Prior to Visit  Medication Sig Dispense Refill   beta carotene w/minerals (OCUVITE) tablet Take 1 tablet by mouth daily.     Calcium Citrate-Vitamin D (CITRACAL + D PO) Take 2 tablets by mouth daily.      Cholecalciferol (VITAMIN D3) 2000 UNITS TABS Take 1 tablet by mouth daily.     diclofenac Sodium (VOLTAREN) 1 % GEL Apply 4 g topically 4 (four) times daily. 480 g 5   Omega-3 Fatty Acids (FISH OIL PO) Take 1 tablet by mouth daily.     No current facility-administered medications on file prior to visit.    ROS Review of Systems  Constitutional: Negative.   HENT: Negative.    Eyes:  Negative for visual disturbance.  Respiratory:  Negative for shortness of breath.   Cardiovascular:  Negative for chest pain.  Gastrointestinal:  Negative for abdominal pain.  Musculoskeletal:  Negative for arthralgias.   Objective:  BP 140/75   Pulse 92   Temp 97.8 F (36.6 C)   Ht 5' (1.524 m)   Wt 95 lb 12.8 oz (43.5 kg)   SpO2 95%   BMI 18.71  kg/m   BP Readings from Last 3 Encounters:  06/21/21 140/75  03/21/21 (!) 141/68  12/20/20 136/73    Wt Readings from Last 3 Encounters:  06/21/21 95 lb 12.8 oz (43.5 kg)  03/21/21 92 lb 6.4 oz (41.9 kg)  12/20/20 91 lb 6 oz (41.4 kg)     Physical Exam Constitutional:      General: She is not in acute distress.    Appearance: She is  well-developed.  HENT:     Head: Normocephalic and atraumatic.     Ears:     Comments: Hard of hearing Eyes:     Conjunctiva/sclera: Conjunctivae normal.     Pupils: Pupils are equal, round, and reactive to light.  Neck:     Thyroid: No thyromegaly.  Cardiovascular:     Rate and Rhythm: Normal rate and regular rhythm.     Heart sounds: Normal heart sounds. No murmur heard. Pulmonary:     Effort: Pulmonary effort is normal. No respiratory distress.     Breath sounds: Normal breath sounds. No wheezing or rales.  Abdominal:     General: Bowel sounds are normal. There is no distension.     Palpations: Abdomen is soft.     Tenderness: There is no abdominal tenderness.  Musculoskeletal:        General: Normal range of motion.     Cervical back: Normal range of motion and neck supple.  Lymphadenopathy:     Cervical: No cervical adenopathy.  Skin:    General: Skin is warm and dry.  Neurological:     Mental Status: She is alert.     Comments: Patient failed orientation to time.  She notes that today is Tuesday but she does not know the date or the month or the year.  She notes that she is in a doctor's office, Dr. Livia Snellen office, [facility] which is incorrect but she properly states she lives in [place].  She cannot recall the county.  She knows she is in [place].  She is repetitive regarding her arthritic pain.  Psychiatric:        Behavior: Behavior normal.        Thought Content: Thought content normal.        Judgment: Judgment normal.      Assessment & Plan:   Jet was seen today for medical management of chronic issues.  Diagnoses and all orders for this visit:  Essential hypertension, benign -     CBC with Differential/Platelet -     CMP14+EGFR -     amLODipine (NORVASC) 5 MG tablet; Take 1 tablet (5 mg total) by mouth daily. for blood pressure -     hydrochlorothiazide (MICROZIDE) 12.5 MG capsule; TAKE ONE (1) CAPSULE EACH DAY -     losartan (COZAAR)  100 MG tablet; TAKE ONE (1) TABLET EACH DAY  Mixed hyperlipidemia -     Lipid panel  Acquired hypothyroidism -     TSH + free T4  Pure hypercholesterolemia -     atorvastatin (LIPITOR) 10 MG tablet; Take 0.5 tablets (5 mg total) by mouth at bedtime.  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 If you have any questions, ask your nurse or doctor.          amLODipine 5 MG tablet Commonly known as: NORVASC Take 1 tablet (5 mg total) by mouth daily. for blood pressure   atorvastatin 10 MG tablet Commonly known as: LIPITOR Take 0.5 tablets (5 mg total) by mouth at bedtime.   beta carotene w/minerals tablet Take 1 tablet by mouth daily.   CITRACAL + D PO Take 2 tablets by mouth daily.   diclofenac Sodium 1 % Gel Commonly known as: Voltaren Apply 4 g topically 4 (four) times daily.   FISH OIL PO Take 1 tablet by mouth daily.   hydrochlorothiazide 12.5 MG capsule Commonly known as: MICROZIDE TAKE ONE (1) CAPSULE EACH DAY   levothyroxine 25 MCG tablet Commonly known as: SYNTHROID TAKE 2 TABLETS DAILY MONDAY-FRIDAY & TAKE 1 TABLET DAILY SATURDAY & SUNDAY   losartan 100 MG tablet Commonly known as: COZAAR TAKE ONE (1) TABLET EACH DAY   Vitamin D3 50 MCG (2000 UT) Tabs Take 1 tablet by mouth daily.        Meds ordered this encounter  Medications   amLODipine (NORVASC) 5 MG tablet    Sig: Take 1 tablet (5 mg total) by mouth daily. for blood pressure    Dispense:  90 tablet    Refill:  3   atorvastatin (LIPITOR) 10 MG tablet    Sig: Take 0.5 tablets (5 mg total)  by mouth at bedtime.    Dispense:  45 tablet    Refill:  3   hydrochlorothiazide (MICROZIDE) 12.5 MG capsule    Sig: TAKE ONE (1) CAPSULE EACH DAY    Dispense:  90 capsule    Refill:  3   levothyroxine (SYNTHROID) 25 MCG tablet    Sig: TAKE 2 TABLETS DAILY MONDAY-FRIDAY & TAKE 1 TABLET DAILY SATURDAY & SUNDAY    Dispense:  154 tablet    Refill:  3   losartan (COZAAR) 100 MG tablet    Sig: TAKE ONE (1) TABLET EACH DAY    Dispense:  90 tablet    Refill:  3      Follow-up: Return in about 6 months (around 12/22/2021).  Claretta Fraise, M.D.

## 2021-06-22 LAB — TSH+FREE T4
Free T4: 1.47 ng/dL (ref 0.82–1.77)
TSH: 2.29 u[IU]/mL (ref 0.450–4.500)

## 2021-06-22 LAB — CBC WITH DIFFERENTIAL/PLATELET
Basophils Absolute: 0 10*3/uL (ref 0.0–0.2)
Basos: 1 %
EOS (ABSOLUTE): 0 10*3/uL (ref 0.0–0.4)
Eos: 1 %
Hematocrit: 36.3 % (ref 34.0–46.6)
Hemoglobin: 12.1 g/dL (ref 11.1–15.9)
Immature Grans (Abs): 0 10*3/uL (ref 0.0–0.1)
Immature Granulocytes: 0 %
Lymphocytes Absolute: 1.3 10*3/uL (ref 0.7–3.1)
Lymphs: 34 %
MCH: 31.1 pg (ref 26.6–33.0)
MCHC: 33.3 g/dL (ref 31.5–35.7)
MCV: 93 fL (ref 79–97)
Monocytes Absolute: 0.6 10*3/uL (ref 0.1–0.9)
Monocytes: 14 %
Neutrophils Absolute: 2 10*3/uL (ref 1.4–7.0)
Neutrophils: 50 %
Platelets: 201 10*3/uL (ref 150–450)
RBC: 3.89 x10E6/uL (ref 3.77–5.28)
RDW: 13.1 % (ref 11.7–15.4)
WBC: 4 10*3/uL (ref 3.4–10.8)

## 2021-06-22 LAB — CMP14+EGFR
ALT: 14 IU/L (ref 0–32)
AST: 22 IU/L (ref 0–40)
Albumin/Globulin Ratio: 1.8 (ref 1.2–2.2)
Albumin: 4.4 g/dL (ref 3.5–4.6)
Alkaline Phosphatase: 61 IU/L (ref 44–121)
BUN/Creatinine Ratio: 33 — ABNORMAL HIGH (ref 12–28)
BUN: 32 mg/dL (ref 10–36)
Bilirubin Total: 0.5 mg/dL (ref 0.0–1.2)
CO2: 25 mmol/L (ref 20–29)
Calcium: 10 mg/dL (ref 8.7–10.3)
Chloride: 96 mmol/L (ref 96–106)
Creatinine, Ser: 0.96 mg/dL (ref 0.57–1.00)
Globulin, Total: 2.5 g/dL (ref 1.5–4.5)
Glucose: 90 mg/dL (ref 65–99)
Potassium: 4.6 mmol/L (ref 3.5–5.2)
Sodium: 134 mmol/L (ref 134–144)
Total Protein: 6.9 g/dL (ref 6.0–8.5)
eGFR: 55 mL/min/{1.73_m2} — ABNORMAL LOW (ref >59)

## 2021-06-22 LAB — LIPID PANEL
Chol/HDL Ratio: 2.3 ratio (ref 0.0–4.4)
Cholesterol, Total: 184 mg/dL (ref 100–199)
HDL: 81 mg/dL (ref >39)
LDL Chol Calc (NIH): 95 mg/dL (ref 0–99)
Triglycerides: 38 mg/dL (ref 0–149)
VLDL Cholesterol Cal: 8 mg/dL (ref 5–40)

## 2021-06-24 NOTE — Progress Notes (Signed)
Hello [place],  Your lab result is normal and/or stable.Some minor variations that are not significant are commonly marked abnormal, but do not represent any medical problem for you.  Best regards, Claretta Fraise, M.D.

## 2021-07-14 DIAGNOSIS — M79676 Pain in unspecified toe(s): Secondary | ICD-10-CM | POA: Diagnosis not present

## 2021-07-14 DIAGNOSIS — L84 Corns and callosities: Secondary | ICD-10-CM | POA: Diagnosis not present

## 2021-07-14 DIAGNOSIS — I70203 Unspecified atherosclerosis of native arteries of extremities, bilateral legs: Secondary | ICD-10-CM | POA: Diagnosis not present

## 2021-07-14 DIAGNOSIS — B351 Tinea unguium: Secondary | ICD-10-CM | POA: Diagnosis not present

## 2021-07-25 ENCOUNTER — Other Ambulatory Visit: Payer: Self-pay | Admitting: Family Medicine

## 2021-07-25 DIAGNOSIS — M1990 Unspecified osteoarthritis, unspecified site: Secondary | ICD-10-CM

## 2021-08-26 ENCOUNTER — Ambulatory Visit (INDEPENDENT_AMBULATORY_CARE_PROVIDER_SITE_OTHER): Payer: Medicare Other

## 2021-08-26 ENCOUNTER — Other Ambulatory Visit: Payer: Self-pay

## 2021-08-26 DIAGNOSIS — Z23 Encounter for immunization: Secondary | ICD-10-CM

## 2021-09-13 ENCOUNTER — Telehealth: Payer: Self-pay | Admitting: Family Medicine

## 2021-09-13 NOTE — Telephone Encounter (Signed)
Left message for patient to call back and schedule Medicare Annual Wellness Visit (AWV) either virtually or phone I left my number for patient to call [phone].  Also left office number   *due 10/30/2009 awvi  [facility]   please schedule at anytime with health coach  This should be a 45 minute visit.

## 2021-09-13 NOTE — Telephone Encounter (Signed)
Left message for patient to call back and schedule Medicare Annual Wellness Visit (AWV) either virtually or phone I left my number for patient to call [phone].  due 10/30/2009 awvi  [facility]  please schedule at anytime with health coach  This should be a 45 minute visit.

## 2021-09-29 ENCOUNTER — Telehealth: Payer: Self-pay | Admitting: Family Medicine

## 2021-09-30 NOTE — Telephone Encounter (Signed)
Approvedtoday Effective from 09/30/2021 through 09/30/2022.  Patient and pharmacy aware.

## 2021-09-30 NOTE — Telephone Encounter (Signed)
Key: [id number] - Rx #: [id number] Diclofenac Sodium 1% gel  Your information has been submitted to [place]. [employer] [place] will review the request and notify you of the determination decision directly, typically within 3 business days of your submission and once all necessary information is received.  You will also receive your request decision electronically. To check for an update later, open the request again from your dashboard.  If [employer] [place] has not responded within the specified timeframe or if you have any questions about your PA submission, contact [place] directly at [facility]) [phone] or ([place]) [phone].

## 2021-11-10 ENCOUNTER — Ambulatory Visit (INDEPENDENT_AMBULATORY_CARE_PROVIDER_SITE_OTHER): Payer: Medicare Other | Admitting: Family Medicine

## 2021-11-10 ENCOUNTER — Encounter: Payer: Self-pay | Admitting: Family Medicine

## 2021-11-10 VITALS — BP 164/79 | HR 83 | Temp 97.0°F | Ht 60.0 in | Wt 95.4 lb

## 2021-11-10 DIAGNOSIS — R143 Flatulence: Secondary | ICD-10-CM

## 2021-11-10 DIAGNOSIS — E039 Hypothyroidism, unspecified: Secondary | ICD-10-CM

## 2021-11-10 DIAGNOSIS — R197 Diarrhea, unspecified: Secondary | ICD-10-CM | POA: Diagnosis not present

## 2021-11-10 MED ORDER — MOMETASONE FUROATE 0.1 % EX CREA: 1.0000 "application " | TOPICAL_CREAM | Freq: Every day | CUTANEOUS | 5 refills | Status: DC

## 2021-11-10 NOTE — Progress Notes (Signed)
Subjective:  Patient ID: Brianna [name], female    DOB: [birth year]  Age: 88 y.o. MRN: [id number]  CC: No chief complaint on file.   HPI DONNARAE RAE presents for lesion at right jawline, left shin and puffness to abdomen. Complains of diarrhea and excess flatulence also. Flatulence is multiple times daily. The diarrhea is an occasional loose BM.    follow-up on  thyroid. The patient has a history of hypothyroidism for many years. It has been stable recently. Pt. denies any change in  voice, loss of hair, heat or cold intolerance. Energy level has been adequate to good. Patient denies constipation and diarrhea. No myxedema. Medication is as noted below. Verified that pt is taking it daily on an empty stomach. Well tolerated.   Depression screen [facility] 2/9 11/10/2021 11/10/2021 06/21/2021  Decreased Interest 2 0 0  Down, Depressed, Hopeless 1 0 0  PHQ - 2 Score 3 0 0  Altered sleeping 1 - -  Tired, decreased energy 1 - -  Change in appetite 1 - -  Feeling bad or failure about yourself  0 - -  Trouble concentrating 3 - -  Moving slowly or fidgety/restless 2 - -  Suicidal thoughts 0 - -  PHQ-9 Score 11 - -  Difficult doing work/chores Somewhat difficult - -  Some recent data might be hidden    History [place] has a past medical history of Bilateral inguinal hernia (BIH), Carpal tunnel syndrome on right, Colon polyp, COPD (chronic obstructive pulmonary disease) (HCC), Essential hypertension, Fibrocystic breast, Frequency of urination, History of skin cancer, Hypothyroidism, Lumbosacral spondylosis without myelopathy, Mixed hyperlipidemia, Myxomatous mitral valve, Obturator hernia, right, Osteoporosis, 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   She has a past surgical history that includes Cesarean section; Appendectomy; Cataract extraction w/ intraocular lens  implant, bilateral; Colon surgery (2004); Ventral hernia repair  (11/21/2011); laparoscopy (11/21/2011); Inguinal hernia repair (11/21/2011); Tonsillectomy; and Total hip arthroplasty (Right, 03/04/2013).   Her family history includes Heart disease in her father and mother; Lung cancer in her brother.She reports that she has never smoked. She has never used smokeless tobacco. She reports that she does not drink alcohol and does not use drugs.    ROS Review of Systems  Constitutional: Negative.   HENT: Negative.    Eyes:  Negative for visual disturbance.  Respiratory:  Negative for shortness of breath.   Cardiovascular:  Negative for chest pain.  Gastrointestinal:  Negative for abdominal pain.  Musculoskeletal:  Negative for arthralgias.   Objective:  BP (!) 164/79    Pulse 83    Temp (!) 97 F (36.1 C)    Ht 5' (1.524 m)    Wt 95 lb 6.4 oz (43.3 kg)    SpO2 97%    BMI 18.63 kg/m   BP Readings from Last 3 Encounters:  11/10/21 (!) 164/79  06/21/21 140/75  03/21/21 (!) 141/68    Wt Readings from Last 3 Encounters:  11/10/21 95 lb 6.4 oz (43.3 kg)  06/21/21 95 lb 12.8 oz (43.5 kg)  03/21/21 92 lb 6.4 oz (41.9 kg)     Physical Exam Constitutional:      General: She is not in acute distress.    Appearance: She is well-developed.  Cardiovascular:     Rate and Rhythm: Normal rate and regular rhythm.  Pulmonary:     Breath sounds: Normal breath sounds.  Abdominal:     General: There is no distension.  Palpations: There is no mass.     Tenderness: There is no abdominal tenderness. There is no guarding or rebound.  Musculoskeletal:        General: Normal range of motion.  Skin:    General: Skin is warm and dry.  Neurological:     Mental Status: She is alert and oriented to person, place, and time.      Assessment & Plan:   Diagnoses and all orders for this visit:  Diarrhea, unspecified type -     CBC with Differential/Platelet -     CMP14+EGFR -     Lipase  Flatulence  Acquired hypothyroidism  Other orders -     mometasone  (ELOCON) 0.1 % cream; Apply 1 application topically daily. To affected areas       I am having Brianna [name] start on mometasone. I am also having her maintain her Vitamin D3, beta carotene w/minerals, Calcium Citrate-Vitamin D (CITRACAL + D PO), Omega-3 Fatty Acids (FISH OIL PO), amLODipine, atorvastatin, hydrochlorothiazide, levothyroxine, losartan, and diclofenac Sodium.  Allergies as of 11/10/2021       Reactions   Alendronate Sodium Nausea Only   Lodine [etodolac]    unknown   Risedronate Sodium    unknown        Medication List        Accurate as of November 10, 2021 11:59 PM. If you have any questions, ask your nurse or doctor.          amLODipine 5 MG tablet Commonly known as: NORVASC Take 1 tablet (5 mg total) by mouth daily. for blood pressure   atorvastatin 10 MG tablet Commonly known as: LIPITOR Take 0.5 tablets (5 mg total) by mouth at bedtime.   beta carotene w/minerals tablet Take 1 tablet by mouth daily.   CITRACAL + D PO Take 2 tablets by mouth daily.   diclofenac Sodium 1 % Gel Commonly known as: VOLTAREN APPLY 4 GRAMS 4 TIMES DAILY   FISH OIL PO Take 1 tablet by mouth daily.   hydrochlorothiazide 12.5 MG capsule Commonly known as: MICROZIDE TAKE ONE (1) CAPSULE EACH DAY   levothyroxine 25 MCG tablet Commonly known as: SYNTHROID TAKE 2 TABLETS DAILY MONDAY-FRIDAY & TAKE 1 TABLET DAILY SATURDAY & SUNDAY   losartan 100 MG tablet Commonly known as: COZAAR TAKE ONE (1) TABLET EACH DAY   mometasone 0.1 % cream Commonly known as: Elocon Apply 1 application topically daily. To affected areas Started by: Claretta Fraise, MD   Vitamin D3 50 MCG (2000 UT) Tabs Take 1 tablet by mouth daily.         Follow-up: No follow-ups on file.  Claretta Fraise, M.D.

## 2021-11-11 LAB — CMP14+EGFR
ALT: 18 IU/L (ref 0–32)
AST: 27 IU/L (ref 0–40)
Albumin/Globulin Ratio: 1.5 (ref 1.2–2.2)
Albumin: 4.3 g/dL (ref 3.5–4.6)
Alkaline Phosphatase: 99 IU/L (ref 44–121)
BUN/Creatinine Ratio: 33 — ABNORMAL HIGH (ref 12–28)
BUN: 27 mg/dL (ref 10–36)
Bilirubin Total: 0.5 mg/dL (ref 0.0–1.2)
CO2: 25 mmol/L (ref 20–29)
Calcium: 10 mg/dL (ref 8.7–10.3)
Chloride: 99 mmol/L (ref 96–106)
Creatinine, Ser: 0.82 mg/dL (ref 0.57–1.00)
Globulin, Total: 2.9 g/dL (ref 1.5–4.5)
Glucose: 93 mg/dL (ref 70–99)
Potassium: 4.3 mmol/L (ref 3.5–5.2)
Sodium: 140 mmol/L (ref 134–144)
Total Protein: 7.2 g/dL (ref 6.0–8.5)
eGFR: 66 mL/min/{1.73_m2} (ref >59)

## 2021-11-11 LAB — CBC WITH DIFFERENTIAL/PLATELET
Basophils Absolute: 0 10*3/uL (ref 0.0–0.2)
Basos: 1 %
EOS (ABSOLUTE): 0.1 10*3/uL (ref 0.0–0.4)
Eos: 2 %
Hematocrit: 36.4 % (ref 34.0–46.6)
Hemoglobin: 12.2 g/dL (ref 11.1–15.9)
Immature Grans (Abs): 0 10*3/uL (ref 0.0–0.1)
Immature Granulocytes: 0 %
Lymphocytes Absolute: 1.2 10*3/uL (ref 0.7–3.1)
Lymphs: 32 %
MCH: 30.8 pg (ref 26.6–33.0)
MCHC: 33.5 g/dL (ref 31.5–35.7)
MCV: 92 fL (ref 79–97)
Monocytes Absolute: 0.6 10*3/uL (ref 0.1–0.9)
Monocytes: 15 %
Neutrophils Absolute: 1.9 10*3/uL (ref 1.4–7.0)
Neutrophils: 50 %
Platelets: 225 10*3/uL (ref 150–450)
RBC: 3.96 x10E6/uL (ref 3.77–5.28)
RDW: 12.5 % (ref 11.7–15.4)
WBC: 3.7 10*3/uL (ref 3.4–10.8)

## 2021-11-11 LAB — LIPASE: Lipase: 39 U/L (ref 14–85)

## 2021-11-11 NOTE — Progress Notes (Signed)
Hello [place],  Your lab result is normal and/or stable.Some minor variations that are not significant are commonly marked abnormal, but do not represent any medical problem for you.  Best regards, Claretta Fraise, M.D.

## 2021-11-12 ENCOUNTER — Encounter: Payer: Self-pay | Admitting: Family Medicine

## 2021-12-01 DIAGNOSIS — M8588 Other specified disorders of bone density and structure, other site: Secondary | ICD-10-CM | POA: Diagnosis not present

## 2021-12-01 DIAGNOSIS — M4316 Spondylolisthesis, lumbar region: Secondary | ICD-10-CM | POA: Diagnosis not present

## 2021-12-01 DIAGNOSIS — M25551 Pain in right hip: Secondary | ICD-10-CM | POA: Diagnosis not present

## 2021-12-01 DIAGNOSIS — S3210XA Unspecified fracture of sacrum, initial encounter for closed fracture: Secondary | ICD-10-CM | POA: Diagnosis not present

## 2021-12-01 DIAGNOSIS — M5136 Other intervertebral disc degeneration, lumbar region: Secondary | ICD-10-CM | POA: Diagnosis not present

## 2021-12-01 DIAGNOSIS — M545 Low back pain, unspecified: Secondary | ICD-10-CM | POA: Diagnosis not present

## 2021-12-01 DIAGNOSIS — S069X0A Unspecified intracranial injury without loss of consciousness, initial encounter: Secondary | ICD-10-CM | POA: Diagnosis not present

## 2021-12-01 DIAGNOSIS — S36892A Contusion of other intra-abdominal organs, initial encounter: Secondary | ICD-10-CM | POA: Diagnosis not present

## 2021-12-01 DIAGNOSIS — M79642 Pain in left hand: Secondary | ICD-10-CM | POA: Diagnosis not present

## 2021-12-01 DIAGNOSIS — D259 Leiomyoma of uterus, unspecified: Secondary | ICD-10-CM | POA: Diagnosis not present

## 2021-12-01 DIAGNOSIS — M5031 Other cervical disc degeneration,  high cervical region: Secondary | ICD-10-CM | POA: Diagnosis not present

## 2021-12-01 DIAGNOSIS — E785 Hyperlipidemia, unspecified: Secondary | ICD-10-CM | POA: Diagnosis not present

## 2021-12-01 DIAGNOSIS — M4322 Fusion of spine, cervical region: Secondary | ICD-10-CM | POA: Diagnosis not present

## 2021-12-01 DIAGNOSIS — M47816 Spondylosis without myelopathy or radiculopathy, lumbar region: Secondary | ICD-10-CM | POA: Diagnosis not present

## 2021-12-01 DIAGNOSIS — S12200A Unspecified displaced fracture of third cervical vertebra, initial encounter for closed fracture: Secondary | ICD-10-CM | POA: Diagnosis not present

## 2021-12-01 DIAGNOSIS — I10 Essential (primary) hypertension: Secondary | ICD-10-CM | POA: Diagnosis not present

## 2021-12-01 DIAGNOSIS — W19XXXA Unspecified fall, initial encounter: Secondary | ICD-10-CM | POA: Diagnosis not present

## 2021-12-01 DIAGNOSIS — Z041 Encounter for examination and observation following transport accident: Secondary | ICD-10-CM | POA: Diagnosis not present

## 2021-12-01 DIAGNOSIS — M8580 Other specified disorders of bone density and structure, unspecified site: Secondary | ICD-10-CM | POA: Diagnosis not present

## 2021-12-06 ENCOUNTER — Telehealth: Payer: Self-pay | Admitting: Family Medicine

## 2021-12-09 ENCOUNTER — Other Ambulatory Visit: Payer: Self-pay | Admitting: Family Medicine

## 2021-12-09 DIAGNOSIS — S329XXA Fracture of unspecified parts of lumbosacral spine and pelvis, initial encounter for closed fracture: Secondary | ICD-10-CM

## 2021-12-09 NOTE — Telephone Encounter (Signed)
Referral placed, as requested WS 

## 2021-12-13 ENCOUNTER — Encounter: Payer: Self-pay | Admitting: Nurse Practitioner

## 2021-12-13 ENCOUNTER — Ambulatory Visit (INDEPENDENT_AMBULATORY_CARE_PROVIDER_SITE_OTHER): Payer: Medicare Other | Admitting: Nurse Practitioner

## 2021-12-13 DIAGNOSIS — R399 Unspecified symptoms and signs involving the genitourinary system: Secondary | ICD-10-CM | POA: Diagnosis not present

## 2021-12-13 LAB — MICROSCOPIC EXAMINATION
Bacteria, UA: NONE SEEN
Epithelial Cells (non renal): NONE SEEN /hpf (ref 0–10)
RBC, Urine: NONE SEEN /hpf (ref 0–2)
Renal Epithel, UA: NONE SEEN /hpf
WBC, UA: NONE SEEN /hpf (ref 0–5)

## 2021-12-13 LAB — URINALYSIS, COMPLETE
Bilirubin, UA: NEGATIVE
Glucose, UA: NEGATIVE
Leukocytes,UA: NEGATIVE
Nitrite, UA: NEGATIVE
Protein,UA: NEGATIVE
RBC, UA: NEGATIVE
Specific Gravity, UA: 1.025 (ref 1.005–1.030)
Urobilinogen, Ur: 1 mg/dL (ref 0.2–1.0)
pH, UA: 5.5 (ref 5.0–7.5)

## 2021-12-13 MED ORDER — CEPHALEXIN 500 MG PO CAPS: 500.0000 mg | ORAL_CAPSULE | Freq: Two times a day (BID) | ORAL | 0 refills | Status: DC

## 2021-12-13 NOTE — Patient Instructions (Signed)

## 2021-12-13 NOTE — Progress Notes (Signed)
Virtual Visit  Note Due to COVID-19 pandemic this visit was conducted virtually. This visit type was conducted due to national recommendations for restrictions regarding the COVID-19 Pandemic (e.g. social distancing, sheltering in place) in an effort to limit this patient's exposure and mitigate transmission in our community. All issues noted in this document were discussed and addressed.  A physical exam was not performed with this format.  I connected with Brianna [name] on 12/13/21 at 12:13 by telephone and verified that I am speaking with the correct person using two identifiers. Brianna [name] is currently located at home and daughter is currently with her during visit. The provider, Mary-Margaret Hassell Done, FNP is located in their office at time of visit.  I discussed the limitations, risks, security and privacy concerns of performing an evaluation and management service by telephone and the availability of in person appointments. I also discussed with the patient that there may be a patient responsible charge related to this service. The patient expressed understanding and agreed to proceed.   History and Present Illness:  Patient feel 2 weeks ago and fractured a bone in her pelvis. Now her daughter thinks she has developed a uti. Has been talking out of her head for the last 3 days. She has been peeing frequently.urine smells strong. Having trouble voiding today.     Review of Systems  Constitutional:  Negative for diaphoresis and weight loss.  Eyes:  Negative for blurred vision, double vision and pain.  Respiratory:  Negative for shortness of breath.   Cardiovascular:  Negative for chest pain, palpitations, orthopnea and leg swelling.  Gastrointestinal:  Negative for abdominal pain.  Genitourinary:  Positive for frequency and urgency. Negative for hematuria.  Skin:  Negative for rash.  Neurological:  Negative for dizziness, sensory change, loss of consciousness, weakness and  headaches.  Endo/Heme/Allergies:  Negative for polydipsia. Does not bruise/bleed easily.  Psychiatric/Behavioral:  Negative for memory loss. The patient does not have insomnia.   All other systems reviewed and are negative.   Observations/Objective: Alert and oriented- answers all questions appropriately No distress   Assessment and Plan: Brianna [name] in today with chief complaint of uti symptoms  1. UTI symptoms Force fluids Rest Try to bring urine specimen into office for UA and cuture  Meds ordered this encounter  Medications   cephALEXin (KEFLEX) 500 MG capsule    Sig: Take 1 capsule (500 mg total) by mouth 2 (two) times daily.    Dispense:  14 capsule    Refill:  0    Order Specific Question:   Supervising Provider    Answer:   Caryl Pina A [[id number]]      Follow Up Instructions: prn    I discussed the assessment and treatment plan with the patient. The patient was provided an opportunity to ask questions and all were answered. The patient agreed with the plan and demonstrated an understanding of the instructions.   The patient was advised to call back or seek an in-person evaluation if the symptoms worsen or if the condition fails to improve as anticipated.  The above assessment and management plan was discussed with the patient. The patient verbalized understanding of and has agreed to the management plan. Patient is aware to call the clinic if symptoms persist or worsen. Patient is aware when to return to the clinic for a follow-up visit. Patient educated on when it is appropriate to go to the emergency department.   Time call ended:  12:25  I provided 12 minutes of  non face-to-face time during this encounter.    Mary-Margaret Hassell Done, FNP

## 2021-12-15 LAB — URINE CULTURE: Organism ID, Bacteria: NO GROWTH

## 2021-12-19 ENCOUNTER — Telehealth: Payer: Self-pay | Admitting: Family Medicine

## 2021-12-19 ENCOUNTER — Other Ambulatory Visit: Payer: Self-pay | Admitting: Family Medicine

## 2021-12-19 MED ORDER — COLCHICINE 0.6 MG PO TABS: ORAL_TABLET | ORAL | 2 refills | Status: DC

## 2021-12-19 NOTE — Telephone Encounter (Signed)
Called and spoke back with daughter she states her mother fell around 4 weeks ago and broke her pelvis. Daughter is staying with her and state she is pretty much bed ridden at this point. She will have to call an ambulance to bring her to her appointments. She states moms fingers are very swollen red and painful she know she has arthritis,but she was wondering if you could call in gout medication with out her being seen to see if that helps so they don't have to call EMS to bring her in here. Please advise

## 2021-12-19 NOTE — Telephone Encounter (Signed)
Patient aware and verbalized understanding. °

## 2021-12-19 NOTE — Telephone Encounter (Signed)
I sent in colchicine for gout. She should not take the atorvastatin while taking the colchicine

## 2021-12-22 ENCOUNTER — Ambulatory Visit: Payer: Medicare Other | Admitting: Family Medicine

## 2021-12-29 ENCOUNTER — Telehealth: Payer: Self-pay | Admitting: Family Medicine

## 2021-12-29 NOTE — Telephone Encounter (Signed)
TELEVISIT GIVEN FOR TOMORROW ? ?

## 2021-12-30 ENCOUNTER — Encounter: Payer: Self-pay | Admitting: Family Medicine

## 2021-12-30 ENCOUNTER — Ambulatory Visit (INDEPENDENT_AMBULATORY_CARE_PROVIDER_SITE_OTHER): Payer: Medicare Other | Admitting: Family Medicine

## 2021-12-30 DIAGNOSIS — N760 Acute vaginitis: Secondary | ICD-10-CM | POA: Diagnosis not present

## 2021-12-30 DIAGNOSIS — R300 Dysuria: Secondary | ICD-10-CM

## 2021-12-30 MED ORDER — MICONAZOLE NITRATE 2 % EX CREA: 1.0000 "application " | TOPICAL_CREAM | Freq: Two times a day (BID) | CUTANEOUS | 0 refills | Status: DC

## 2021-12-30 NOTE — Progress Notes (Signed)
? ?  Virtual Visit via telephone Note ? ?I connected with Brianna [name] on 12/30/21 at 1107 by telephone and verified that I am speaking with the correct person using two identifiers. Brianna [name] is currently located at home and patient and daughter Brianna [name]  are currently with her during visit. The provider, Fransisca Kaufmann [place], MD is located in their office at time of visit. ? ?Call ended at 1121 ? ?I discussed the limitations, risks, security and privacy concerns of performing an evaluation and management service by telephone and the availability of in person appointments. I also discussed with the patient that there may be a patient responsible charge related to this service. The patient expressed understanding and agreed to proceed. ? ? ?History and Present Illness: ?Patient daughter says she is calling in for burning and dysuria and vaginal irritation.  She says she has irritation.  She is not having vaginal discharge. She was treated for UTI and finished antibiotic about 2 weeks ago.  She has these complaints over the past few days.  ? ?1. Acute vaginitis   ?2. Dysuria   ? ? ? ? ? ?Review of Systems  ?Constitutional:  Negative for chills and fever.  ?Eyes:  Negative for visual disturbance.  ?Respiratory:  Negative for chest tightness and shortness of breath.   ?Cardiovascular:  Negative for chest pain and leg swelling.  ?Gastrointestinal:  Negative for abdominal pain.  ?Genitourinary:  Positive for dysuria, frequency and urgency. Negative for difficulty urinating, hematuria, vaginal bleeding, vaginal discharge and vaginal pain.  ?Musculoskeletal:  Negative for back pain and gait problem.  ?Skin:  Negative for rash.  ?Neurological:  Negative for light-headedness and headaches.  ?Psychiatric/Behavioral:  Negative for agitation and behavioral problems.   ?All other systems reviewed and are negative. ? ?Observations/Objective: ?Patient sounds comfortable ? ?Assessment and Plan: ?Problem List Items Addressed This  Visit   ?None ?Visit Diagnoses   ? ? Acute vaginitis    -  Primary  ? Relevant Medications  ? miconazole (MICATIN) 2 % cream  ? Other Relevant Orders  ? Urinalysis, Complete  ? Urine Culture  ? Dysuria      ? Relevant Medications  ? miconazole (MICATIN) 2 % cream  ? Other Relevant Orders  ? Urinalysis, Complete  ? Urine Culture  ? ?  ?  ?Placed order for urinalysis to come in.  Was just treated for UTI so may have vaginal yeast infection.  Did send miconazole cream. ?Follow up plan: ?Return if symptoms worsen or fail to improve. ? ? ?  ?I discussed the assessment and treatment plan with the patient. The patient was provided an opportunity to ask questions and all were answered. The patient agreed with the plan and demonstrated an understanding of the instructions. ?  ?The patient was advised to call back or seek an in-person evaluation if the symptoms worsen or if the condition fails to improve as anticipated. ? ?The above assessment and management plan was discussed with the patient. The patient verbalized understanding of and has agreed to the management plan. Patient is aware to call the clinic if symptoms persist or worsen. Patient is aware when to return to the clinic for a follow-up visit. Patient educated on when it is appropriate to go to the emergency department.  ? ? ?I provided 14 minutes of non-face-to-face time during this encounter. ? ? ? ?Worthy Rancher, MD ?   ?

## 2022-01-02 ENCOUNTER — Other Ambulatory Visit: Payer: Self-pay

## 2022-01-02 ENCOUNTER — Other Ambulatory Visit: Payer: Medicare Other

## 2022-01-02 DIAGNOSIS — R300 Dysuria: Secondary | ICD-10-CM

## 2022-01-02 DIAGNOSIS — N760 Acute vaginitis: Secondary | ICD-10-CM | POA: Diagnosis not present

## 2022-01-03 ENCOUNTER — Telehealth: Payer: Self-pay

## 2022-01-03 LAB — URINALYSIS, COMPLETE
Bilirubin, UA: NEGATIVE
Glucose, UA: NEGATIVE
Nitrite, UA: NEGATIVE
Protein,UA: NEGATIVE
Specific Gravity, UA: 1.02 (ref 1.005–1.030)
Urobilinogen, Ur: 0.2 mg/dL (ref 0.2–1.0)
pH, UA: 5.5 (ref 5.0–7.5)

## 2022-01-03 LAB — MICROSCOPIC EXAMINATION: Bacteria, UA: NONE SEEN

## 2022-01-03 NOTE — Telephone Encounter (Signed)
Home health servies ?

## 2022-01-03 NOTE — Telephone Encounter (Signed)
Daughter came in to office requesting to speak with Dr Livia Snellen. She states that her mother needs a face to face visit or a video visit. She states that she is not able to do a video visit. She asked if she pulled her mom up to the front door could Dr Livia Snellen come out and see her mom. I advised that we could help her get her mom in the building and see her but she wanted a message sent to Dr Livia Snellen to see if he could work something out with her. Please advise ?

## 2022-01-03 NOTE — Telephone Encounter (Signed)
For what purpose does she need a face to face visit? ?

## 2022-01-03 NOTE — Telephone Encounter (Signed)
I will see her at the back door if they can pull up there ?

## 2022-01-04 LAB — URINE CULTURE

## 2022-01-04 NOTE — Telephone Encounter (Signed)
Daughter aware and has already scheduled appointment. ?

## 2022-01-10 ENCOUNTER — Ambulatory Visit (INDEPENDENT_AMBULATORY_CARE_PROVIDER_SITE_OTHER): Payer: Medicare Other | Admitting: Family Medicine

## 2022-01-10 ENCOUNTER — Encounter: Payer: Self-pay | Admitting: Family Medicine

## 2022-01-10 ENCOUNTER — Ambulatory Visit (INDEPENDENT_AMBULATORY_CARE_PROVIDER_SITE_OTHER): Payer: Medicare Other

## 2022-01-10 VITALS — BP 100/63 | HR 82 | Temp 97.3°F

## 2022-01-10 DIAGNOSIS — R102 Pelvic and perineal pain: Secondary | ICD-10-CM | POA: Diagnosis not present

## 2022-01-10 DIAGNOSIS — S329XXA Fracture of unspecified parts of lumbosacral spine and pelvis, initial encounter for closed fracture: Secondary | ICD-10-CM

## 2022-01-10 NOTE — Progress Notes (Signed)
? ?Subjective:  ?Patient ID: Brianna [name], female    DOB: [birth year]  Age: 88 y.o. MRN: [id number] ? ?CC: FACE TO FACE ? ? ?HPI ?Brianna [name] presents for pelvic fracture that occurred several weeks ago. Needs home PT. She cannot walk steadily. She needs maximal assistance for transfers. Cannot walk up or down steps. She has seps outsside the home and two assistants are required just to get her out of the house. Her daughter has been doing much of her care, but is unable to help with steps due to her own health. Today she was brought ot the office by her caregiver and daughter. Three staff members were required to get her to transfer from the car to wheelchair, to x-ray. ? ?She is in need of PT to help her learn to walk again since the fracture. She needs training to use a cane &/or a walker and to help her with balance.  ? ?Depression screen [name] 2/9 11/10/2021 11/10/2021 06/21/2021  ?Decreased Interest 2 0 0  ?Down, Depressed, Hopeless 1 0 0  ?PHQ - 2 Score 3 0 0  ?Altered sleeping 1 - -  ?Tired, decreased energy 1 - -  ?Change in appetite 1 - -  ?Feeling bad or failure about yourself  0 - -  ?Trouble concentrating 3 - -  ?Moving slowly or fidgety/restless 2 - -  ?Suicidal thoughts 0 - -  ?PHQ-9 Score 11 - -  ?Difficult doing work/chores Somewhat difficult - -  ?Some recent data might be hidden  ? ? 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  ? ?She has a past surgical history that includes Cesarean section; Appendectomy; Cataract extraction w/  intraocular lens  implant, bilateral; Colon surgery (2004); Ventral hernia repair (11/21/2011); laparoscopy (11/21/2011); Inguinal hernia repair (11/21/2011); Tonsillectomy; and Total hip arthroplasty (Right, 03/04/2013).  ? ?Her family history includes Heart disease in her father and mother; Lung cancer in her brother.She reports that she has never smoked. She has never used smokeless tobacco. She reports that she does not drink alcohol and does not use drugs. ? ? ? ?ROS ?Review of Systems  ?Constitutional: Negative.   ?HENT: Negative.    ?Eyes:  Negative for visual disturbance.  ?Respiratory:  Negative for shortness of breath.   ?Cardiovascular:  Negative for chest pain.  ?Gastrointestinal:  Negative for abdominal pain.  ?Musculoskeletal:  Positive for arthralgias and gait problem.  ? ?Objective:  ?BP 100/63   Pulse 82   Temp (!) 97.3 ?F (36.3 ?C)  ? ?BP Readings from Last 3 Encounters:  ?01/10/22 100/63  ?11/10/21 (!) 164/79  ?06/21/21 140/75  ? ? ?Wt Readings from Last 3 Encounters:  ?11/10/21 95 lb 6.4 oz (43.3 kg)  ?06/21/21 95 lb 12.8 oz (43.5 kg)  ?03/21/21 92 lb 6.4 oz (41.9 kg)  ? ? ? ?Physical Exam ?Constitutional:   ?   General: She is not in acute distress. ?   Appearance: She is well-developed.  ?Cardiovascular:  ?   Rate and Rhythm: Normal rate and regular rhythm.  ?Pulmonary:  ?   Breath sounds: Normal breath sounds.  ?Musculoskeletal:     ?   General: Tenderness (right  hip and leg) present.  ?Skin: ?   General: Skin is warm and dry.  ?Neurological:  ?   Mental Status: She is alert and oriented to person, place, and time.  ?Psychiatric:     ?   Mood and Affect: Mood normal.  ? ? ? ? ?Assessment & Plan:  ? ?Brianna [name] was seen today for face to face. ? ?Diagnoses and all orders for this visit: ? ?Closed displaced fracture of pelvis, unspecified part of pelvis, initial encounter ([place]) ?-     DG Pelvis 1-2 Views; Future ?-     Ambulatory referral to Home Health ? ? ? ? ? ? ?I am having Brianna [name] maintain  her Vitamin D3, beta carotene w/minerals, Calcium Citrate-Vitamin D (CITRACAL + D PO), Omega-3 Fatty Acids (FISH OIL PO), amLODipine, atorvastatin, hydrochlorothiazide, levothyroxine, losartan, diclofenac Sodium, mometasone, cephALEXin, colchicine, and miconazole. ? ?Allergies as of 01/10/2022   ? ?   Reactions  ? Alendronate Sodium Nausea Only  ? Lodine [etodolac]   ? unknown  ? Risedronate Sodium   ? unknown  ? ?  ? ?  ?Medication List  ?  ? ?  ? Accurate as of January 10, 2022  8:24 PM. If you have any questions, ask your nurse or doctor.  ?  ?  ? ?  ? ?amLODipine 5 MG tablet ?Commonly known as: NORVASC ?Take 1 tablet (5 mg total) by mouth daily. for blood pressure ?  ?atorvastatin 10 MG tablet ?Commonly known as: LIPITOR ?Take 0.5 tablets (5 mg total) by mouth at bedtime. ?  ?beta carotene w/minerals tablet ?Take 1 tablet by mouth daily. ?  ?cephALEXin 500 MG capsule ?Commonly known as: Keflex ?Take 1 capsule (500 mg total) by mouth 2 (two) times daily. ?  ?CITRACAL + D PO ?Take 2 tablets by mouth daily. ?  ?colchicine 0.6 MG tablet ?Take twice daily for gout attack. (may take every two hours up to 6 doses at acute onset) ?  ?diclofenac Sodium 1 % Gel ?Commonly known as: VOLTAREN ?APPLY 4 GRAMS 4 TIMES DAILY ?  ?FISH OIL PO ?Take 1 tablet by mouth daily. ?  ?hydrochlorothiazide 12.5 MG capsule ?Commonly known as: MICROZIDE ?TAKE ONE (1) CAPSULE EACH DAY ?  ?levothyroxine 25 MCG tablet ?Commonly known as: SYNTHROID ?TAKE 2 TABLETS DAILY MONDAY-FRIDAY & TAKE 1 TABLET DAILY SATURDAY & SUNDAY ?  ?losartan 100 MG tablet ?Commonly known as: COZAAR ?TAKE ONE (1) TABLET EACH DAY ?  ?miconazole 2 % cream ?Commonly known as: Micatin ?Apply 1 application topically 2 (two) times daily. ?  ?mometasone 0.1 % cream ?Commonly known as: Elocon ?Apply 1 application topically daily. To affected areas ?  ?Vitamin D3 50 MCG (2000 UT) Tabs ?Take 1 tablet by mouth daily. ?  ? ?  ? ? ? ?Follow-up: Return in about 3 months (around  04/12/2022), or if symptoms worsen or fail to improve. ? ?Claretta Fraise, M.D. ?

## 2022-01-18 ENCOUNTER — Other Ambulatory Visit: Payer: Self-pay | Admitting: Family Medicine

## 2022-01-18 DIAGNOSIS — S329XXA Fracture of unspecified parts of lumbosacral spine and pelvis, initial encounter for closed fracture: Secondary | ICD-10-CM

## 2022-01-20 DIAGNOSIS — J449 Chronic obstructive pulmonary disease, unspecified: Secondary | ICD-10-CM | POA: Diagnosis not present

## 2022-01-20 DIAGNOSIS — I10 Essential (primary) hypertension: Secondary | ICD-10-CM | POA: Diagnosis not present

## 2022-01-20 DIAGNOSIS — M800AXD Age-related osteoporosis with current pathological fracture, other site, subsequent encounter for fracture with routine healing: Secondary | ICD-10-CM | POA: Diagnosis not present

## 2022-01-20 DIAGNOSIS — M47817 Spondylosis without myelopathy or radiculopathy, lumbosacral region: Secondary | ICD-10-CM | POA: Diagnosis not present

## 2022-01-20 DIAGNOSIS — M1990 Unspecified osteoarthritis, unspecified site: Secondary | ICD-10-CM | POA: Diagnosis not present

## 2022-01-20 DIAGNOSIS — Z85048 Personal history of other malignant neoplasm of rectum, rectosigmoid junction, and anus: Secondary | ICD-10-CM | POA: Diagnosis not present

## 2022-01-20 DIAGNOSIS — D692 Other nonthrombocytopenic purpura: Secondary | ICD-10-CM | POA: Diagnosis not present

## 2022-01-20 DIAGNOSIS — E782 Mixed hyperlipidemia: Secondary | ICD-10-CM | POA: Diagnosis not present

## 2022-01-20 DIAGNOSIS — E039 Hypothyroidism, unspecified: Secondary | ICD-10-CM | POA: Diagnosis not present

## 2022-01-20 DIAGNOSIS — I739 Peripheral vascular disease, unspecified: Secondary | ICD-10-CM | POA: Diagnosis not present

## 2022-01-20 DIAGNOSIS — M069 Rheumatoid arthritis, unspecified: Secondary | ICD-10-CM | POA: Diagnosis not present

## 2022-01-31 ENCOUNTER — Ambulatory Visit (INDEPENDENT_AMBULATORY_CARE_PROVIDER_SITE_OTHER): Payer: Medicare Other

## 2022-01-31 DIAGNOSIS — E782 Mixed hyperlipidemia: Secondary | ICD-10-CM

## 2022-01-31 DIAGNOSIS — E039 Hypothyroidism, unspecified: Secondary | ICD-10-CM

## 2022-01-31 DIAGNOSIS — I739 Peripheral vascular disease, unspecified: Secondary | ICD-10-CM

## 2022-01-31 DIAGNOSIS — M069 Rheumatoid arthritis, unspecified: Secondary | ICD-10-CM

## 2022-01-31 DIAGNOSIS — M800AXD Age-related osteoporosis with current pathological fracture, other site, subsequent encounter for fracture with routine healing: Secondary | ICD-10-CM

## 2022-01-31 DIAGNOSIS — M47817 Spondylosis without myelopathy or radiculopathy, lumbosacral region: Secondary | ICD-10-CM

## 2022-01-31 DIAGNOSIS — I10 Essential (primary) hypertension: Secondary | ICD-10-CM | POA: Diagnosis not present

## 2022-01-31 DIAGNOSIS — M1990 Unspecified osteoarthritis, unspecified site: Secondary | ICD-10-CM

## 2022-01-31 DIAGNOSIS — J449 Chronic obstructive pulmonary disease, unspecified: Secondary | ICD-10-CM

## 2022-01-31 DIAGNOSIS — D692 Other nonthrombocytopenic purpura: Secondary | ICD-10-CM

## 2022-02-19 DIAGNOSIS — Z85048 Personal history of other malignant neoplasm of rectum, rectosigmoid junction, and anus: Secondary | ICD-10-CM | POA: Diagnosis not present

## 2022-02-19 DIAGNOSIS — M800AXD Age-related osteoporosis with current pathological fracture, other site, subsequent encounter for fracture with routine healing: Secondary | ICD-10-CM | POA: Diagnosis not present

## 2022-02-19 DIAGNOSIS — E782 Mixed hyperlipidemia: Secondary | ICD-10-CM | POA: Diagnosis not present

## 2022-02-19 DIAGNOSIS — M069 Rheumatoid arthritis, unspecified: Secondary | ICD-10-CM | POA: Diagnosis not present

## 2022-02-19 DIAGNOSIS — I10 Essential (primary) hypertension: Secondary | ICD-10-CM | POA: Diagnosis not present

## 2022-02-19 DIAGNOSIS — E039 Hypothyroidism, unspecified: Secondary | ICD-10-CM | POA: Diagnosis not present

## 2022-02-19 DIAGNOSIS — I739 Peripheral vascular disease, unspecified: Secondary | ICD-10-CM | POA: Diagnosis not present

## 2022-02-19 DIAGNOSIS — M1990 Unspecified osteoarthritis, unspecified site: Secondary | ICD-10-CM | POA: Diagnosis not present

## 2022-02-19 DIAGNOSIS — M47817 Spondylosis without myelopathy or radiculopathy, lumbosacral region: Secondary | ICD-10-CM | POA: Diagnosis not present

## 2022-02-19 DIAGNOSIS — J449 Chronic obstructive pulmonary disease, unspecified: Secondary | ICD-10-CM | POA: Diagnosis not present

## 2022-02-19 DIAGNOSIS — D692 Other nonthrombocytopenic purpura: Secondary | ICD-10-CM | POA: Diagnosis not present

## 2022-05-04 ENCOUNTER — Ambulatory Visit: Payer: Medicare Other | Admitting: Family Medicine

## 2022-05-11 ENCOUNTER — Ambulatory Visit (INDEPENDENT_AMBULATORY_CARE_PROVIDER_SITE_OTHER): Payer: Medicare Other | Admitting: Family Medicine

## 2022-05-11 ENCOUNTER — Encounter: Payer: Self-pay | Admitting: Family Medicine

## 2022-05-11 VITALS — BP 125/70 | HR 85 | Temp 97.2°F | Ht 60.0 in | Wt 94.2 lb

## 2022-05-11 DIAGNOSIS — I10 Essential (primary) hypertension: Secondary | ICD-10-CM

## 2022-05-11 DIAGNOSIS — R600 Localized edema: Secondary | ICD-10-CM | POA: Diagnosis not present

## 2022-05-11 DIAGNOSIS — E039 Hypothyroidism, unspecified: Secondary | ICD-10-CM

## 2022-05-11 DIAGNOSIS — M1A0490 Idiopathic chronic gout, unspecified hand, without tophus (tophi): Secondary | ICD-10-CM

## 2022-05-11 DIAGNOSIS — E782 Mixed hyperlipidemia: Secondary | ICD-10-CM

## 2022-05-11 MED ORDER — AMLODIPINE BESYLATE 5 MG PO TABS: 5.0000 mg | ORAL_TABLET | Freq: Every day | ORAL | 3 refills | Status: DC

## 2022-05-11 MED ORDER — LOSARTAN POTASSIUM 100 MG PO TABS: ORAL_TABLET | ORAL | 3 refills | Status: DC

## 2022-05-11 MED ORDER — ATORVASTATIN CALCIUM 10 MG PO TABS: 5.0000 mg | ORAL_TABLET | Freq: Every day | ORAL | 3 refills | Status: DC

## 2022-05-11 MED ORDER — HYDROCHLOROTHIAZIDE 12.5 MG PO CAPS: ORAL_CAPSULE | ORAL | 3 refills | Status: DC

## 2022-05-11 MED ORDER — LEVOTHYROXINE SODIUM 25 MCG PO TABS: ORAL_TABLET | ORAL | 3 refills | Status: DC

## 2022-05-11 NOTE — Progress Notes (Signed)
Subjective:  Patient ID: Brianna [name], female    DOB: October 09, 1925  Age: 88 y.o. MRN: [id number]  CC: Medical Management of Chronic Issues   HPI Brianna [name] presents for  follow-up of hypertension. Patient has no history of headache chest pain or shortness of breath or recent cough. Patient also denies symptoms of TIA such as focal numbness or weakness. Patient denies side effects from medication. States taking it regularly.   follow-up on  thyroid. The patient has a history of hypothyroidism for many years. It has been stable recently. Pt. denies any change in  voice, loss of hair, heat or cold intolerance. Energy level has been decreasing. Patient denies constipation and diarrhea. No myxedema. Medication is as noted below. Verified that pt is taking it daily on an empty stomach. Well tolerated.  Severe diarrhea with use of colchicine. Hand remains swollen. Needs new medication.  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   She has a past surgical history that includes Cesarean section; Appendectomy; Cataract extraction w/ intraocular lens  implant, bilateral; Colon surgery (2004); Ventral hernia repair (11/21/2011); laparoscopy (11/21/2011); Inguinal hernia repair (11/21/2011); Tonsillectomy; and Total hip arthroplasty (Right, 03/04/2013).   Her family history includes Heart disease in her father and mother; Lung cancer in her brother.She reports that she has never smoked. She has never used smokeless tobacco. She reports that she  does not drink alcohol and does not use drugs.  Current Outpatient Medications on File Prior to Visit  Medication Sig Dispense Refill   beta carotene w/minerals (OCUVITE) tablet Take 1 tablet by mouth daily.     Calcium Citrate-Vitamin D (CITRACAL + D PO) Take 2 tablets by mouth daily.      Cholecalciferol (VITAMIN D3) 2000 UNITS TABS Take 1 tablet by mouth daily.     diclofenac Sodium (VOLTAREN) 1 % GEL APPLY 4 GRAMS 4 TIMES DAILY 400 g 11   miconazole (MICATIN) 2 % cream Apply 1 application topically 2 (two) times daily. 28.35 g 0   mometasone (ELOCON) 0.1 % cream Apply 1 application topically daily. To affected areas 45 g 5   Omega-3 Fatty Acids (FISH OIL PO) Take 1 tablet by mouth daily.     No current facility-administered medications on file prior to visit.    ROS Review of Systems  Constitutional: Negative.   HENT: Negative.    Eyes:  Negative for visual disturbance.  Respiratory:  Negative for shortness of breath.   Cardiovascular:  Negative for chest pain.  Gastrointestinal:  Negative for abdominal pain.  Musculoskeletal:  Positive for joint swelling (right hand). Negative for arthralgias.    Objective:  BP 125/70   Pulse 85   Temp (!) 97.2 F (36.2 C)   Ht 5' (1.524 m)   Wt 94 lb 3.2 oz (42.7 kg)   SpO2 95%   BMI 18.40 kg/m   BP Readings from Last 3 Encounters:  05/11/22 125/70  01/10/22 100/63  11/10/21 (!) 164/79    Wt Readings from Last 3 Encounters:  05/11/22 94 lb 3.2 oz (42.7 kg)  11/10/21 95 lb 6.4 oz (43.3 kg)  06/21/21 95 lb 12.8 oz (43.5 kg)  Physical Exam Constitutional:      General: She is not in acute distress.    Appearance: She is well-developed.  Cardiovascular:     Rate and Rhythm: Normal rate and regular rhythm.  Pulmonary:     Breath sounds: Normal breath sounds.  Musculoskeletal:        General: Swelling (right hand, mostly at PIP area) present. Normal range of motion.  Skin:    General: Skin is warm and dry.   Neurological:     Mental Status: She is alert and oriented to person, place, and time.       Assessment & Plan:   Brianna [name] was seen today for medical management of chronic issues.  Diagnoses and all orders for this visit:  Acquired hypothyroidism -     TSH + free T4 -     levothyroxine (SYNTHROID) 25 MCG tablet; TAKE 2 TABLETS DAILY MONDAY-FRIDAY & TAKE 1 TABLET DAILY SATURDAY & SUNDAY  Mixed hyperlipidemia -     Lipid panel -     atorvastatin (LIPITOR) 10 MG tablet; Take 0.5 tablets (5 mg total) by mouth at bedtime.  Essential hypertension, benign -     CBC with Differential/Platelet -     CMP14+EGFR -     amLODipine (NORVASC) 5 MG tablet; Take 1 tablet (5 mg total) by mouth daily. 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 If you have any questions, ask your nurse or doctor.          STOP taking these medications    cephALEXin 500 MG capsule Commonly known as: Keflex Stopped by: Claretta Fraise, MD   colchicine 0.6 MG tablet Stopped by: Claretta Fraise, MD       TAKE these medications    amLODipine 5 MG tablet Commonly known as: NORVASC Take 1 tablet (5 mg total) by mouth daily. for blood pressure   atorvastatin 10 MG tablet Commonly known as: LIPITOR Take 0.5 tablets (5 mg total) by mouth at bedtime.   beta carotene w/minerals tablet Take 1 tablet by mouth daily.   CITRACAL + D PO Take 2 tablets by mouth daily.   diclofenac Sodium 1 % Gel Commonly known as: VOLTAREN APPLY 4 GRAMS 4 TIMES DAILY    FISH OIL PO Take 1 tablet by mouth daily.   hydrochlorothiazide 12.5 MG capsule Commonly known as: MICROZIDE TAKE ONE (1) CAPSULE EACH DAY   levothyroxine 25 MCG tablet Commonly known as: SYNTHROID TAKE 2 TABLETS DAILY MONDAY-FRIDAY & TAKE 1 TABLET DAILY SATURDAY & SUNDAY   losartan 100 MG tablet Commonly known as: COZAAR TAKE ONE (1) TABLET EACH DAY   miconazole 2 % cream Commonly known as: Micatin Apply 1 application topically 2 (two) times daily.   mometasone 0.1 % cream Commonly known as: Elocon Apply 1 application topically daily. To affected areas   Vitamin D3 50 MCG (2000 UT) Tabs Take 1 tablet  by mouth daily.        Meds ordered this encounter  Medications   amLODipine (NORVASC) 5 MG tablet    Sig: Take 1 tablet (5 mg total) by mouth daily. for blood pressure    Dispense:  90 tablet    Refill:  3   atorvastatin (LIPITOR) 10 MG tablet    Sig: Take 0.5 tablets (5 mg total) by mouth at bedtime.    Dispense:  45 tablet    Refill:  3   hydrochlorothiazide (MICROZIDE) 12.5 MG capsule    Sig: TAKE ONE (1) CAPSULE EACH DAY    Dispense:  90 capsule    Refill:  3   levothyroxine (SYNTHROID) 25 MCG tablet    Sig: TAKE 2 TABLETS DAILY MONDAY-FRIDAY & TAKE 1 TABLET DAILY SATURDAY & SUNDAY    Dispense:  154 tablet    Refill:  3   losartan (COZAAR) 100 MG tablet    Sig: TAKE ONE (1) TABLET EACH DAY    Dispense:  90 tablet    Refill:  3    We discussed renal injury as a result of use of indocin. Recommended minimal use only for severe gout exacerbation.  Follow-up: Return in about 3 months (around 08/11/2022).  Claretta Fraise, M.D.

## 2022-05-12 LAB — CBC WITH DIFFERENTIAL/PLATELET
Basophils Absolute: 0 10*3/uL (ref 0.0–0.2)
Basos: 1 %
EOS (ABSOLUTE): 0.1 10*3/uL (ref 0.0–0.4)
Eos: 1 %
Hematocrit: 31.4 % — ABNORMAL LOW (ref 34.0–46.6)
Hemoglobin: 10.5 g/dL — ABNORMAL LOW (ref 11.1–15.9)
Immature Grans (Abs): 0 10*3/uL (ref 0.0–0.1)
Immature Granulocytes: 0 %
Lymphocytes Absolute: 1.2 10*3/uL (ref 0.7–3.1)
Lymphs: 21 %
MCH: 30.2 pg (ref 26.6–33.0)
MCHC: 33.4 g/dL (ref 31.5–35.7)
MCV: 90 fL (ref 79–97)
Monocytes Absolute: 0.8 10*3/uL (ref 0.1–0.9)
Monocytes: 14 %
Neutrophils Absolute: 3.7 10*3/uL (ref 1.4–7.0)
Neutrophils: 63 %
Platelets: 254 10*3/uL (ref 150–450)
RBC: 3.48 x10E6/uL — ABNORMAL LOW (ref 3.77–5.28)
RDW: 12.9 % (ref 11.7–15.4)
WBC: 5.8 10*3/uL (ref 3.4–10.8)

## 2022-05-12 LAB — CMP14+EGFR
ALT: 10 IU/L (ref 0–32)
AST: 15 IU/L (ref 0–40)
Albumin/Globulin Ratio: 1.4 (ref 1.2–2.2)
Albumin: 3.7 g/dL (ref 3.6–4.6)
Alkaline Phosphatase: 88 IU/L (ref 44–121)
BUN/Creatinine Ratio: 22 (ref 12–28)
BUN: 21 mg/dL (ref 10–36)
Bilirubin Total: 0.4 mg/dL (ref 0.0–1.2)
CO2: 22 mmol/L (ref 20–29)
Calcium: 8.7 mg/dL (ref 8.7–10.3)
Chloride: 103 mmol/L (ref 96–106)
Creatinine, Ser: 0.94 mg/dL (ref 0.57–1.00)
Globulin, Total: 2.7 g/dL (ref 1.5–4.5)
Glucose: 125 mg/dL — ABNORMAL HIGH (ref 70–99)
Potassium: 4.1 mmol/L (ref 3.5–5.2)
Sodium: 140 mmol/L (ref 134–144)
Total Protein: 6.4 g/dL (ref 6.0–8.5)
eGFR: 56 mL/min/{1.73_m2} — ABNORMAL LOW (ref >59)

## 2022-05-12 LAB — LIPID PANEL
Chol/HDL Ratio: 2.9 ratio (ref 0.0–4.4)
Cholesterol, Total: 186 mg/dL (ref 100–199)
HDL: 65 mg/dL (ref >39)
LDL Chol Calc (NIH): 110 mg/dL — ABNORMAL HIGH (ref 0–99)
Triglycerides: 58 mg/dL (ref 0–149)
VLDL Cholesterol Cal: 11 mg/dL (ref 5–40)

## 2022-05-12 LAB — TSH+FREE T4
Free T4: 1.49 ng/dL (ref 0.82–1.77)
TSH: 1.25 u[IU]/mL (ref 0.450–4.500)

## 2022-05-15 ENCOUNTER — Encounter: Payer: Self-pay | Admitting: Family Medicine

## 2022-05-19 ENCOUNTER — Telehealth: Payer: Self-pay | Admitting: Family Medicine

## 2022-05-19 NOTE — Telephone Encounter (Signed)
Right hand swelling mentioned in 7/13 Office notes  Also mentions Indocin and to use only of needed: We discussed renal injury as a result of use of indocin. Recommended minimal use only for severe gout exacerbation  (Not on med list, but pt does say it was not sent in)  Please review and see if this can be handled with her not coming back in or waiting until Monday

## 2022-05-19 NOTE — Telephone Encounter (Signed)
I do not see anything that in the notes about sending in something new for this, she would have to either be seen by somebody else or wait until Dr. Livia Snellen comes back next week and ask him what was going to be sent in because I do not see anything in the note

## 2022-05-19 NOTE — Telephone Encounter (Signed)
  Incoming Patient Call  05/19/2022  What symptoms do you have? Swelling in right hand and discussed it with Stacks at her appt. Don't want the medication he first put her on. Stacks was going to call another medication in and pharmacy don't have.  How long have you been sick? 2 weeks  Have you been seen for this problem? YES  If your provider decides to give you a prescription, which pharmacy would you like for it to be sent to? [facility]   Patient informed that this information will be sent to the clinical staff for review and that they should receive a follow up call.

## 2022-05-21 ENCOUNTER — Other Ambulatory Visit: Payer: Self-pay | Admitting: Family Medicine

## 2022-05-21 MED ORDER — INDOMETHACIN 25 MG PO CAPS
25.0000 mg | ORAL_CAPSULE | Freq: Two times a day (BID) | ORAL | 1 refills | Status: DC | PRN
Start: 2022-05-21 — End: 2022-08-29

## 2022-05-21 NOTE — Telephone Encounter (Signed)
I sent in the prescription as requested

## 2022-05-22 NOTE — Progress Notes (Signed)
Patient aware.

## 2022-05-22 NOTE — Telephone Encounter (Signed)
Patients daughter Vickii Chafe aware

## 2022-06-22 ENCOUNTER — Ambulatory Visit: Payer: Medicare Other | Admitting: Family Medicine

## 2022-06-28 ENCOUNTER — Ambulatory Visit (INDEPENDENT_AMBULATORY_CARE_PROVIDER_SITE_OTHER): Payer: Medicare Other | Admitting: Family Medicine

## 2022-06-28 ENCOUNTER — Encounter: Payer: Self-pay | Admitting: Family Medicine

## 2022-06-28 VITALS — BP 165/69 | HR 76 | Temp 98.1°F | Ht 60.0 in | Wt 95.2 lb

## 2022-06-28 DIAGNOSIS — I10 Essential (primary) hypertension: Secondary | ICD-10-CM | POA: Diagnosis not present

## 2022-06-28 DIAGNOSIS — D649 Anemia, unspecified: Secondary | ICD-10-CM

## 2022-06-28 DIAGNOSIS — D485 Neoplasm of uncertain behavior of skin: Secondary | ICD-10-CM | POA: Diagnosis not present

## 2022-06-28 MED ORDER — HYDROCHLOROTHIAZIDE 12.5 MG PO CAPS: ORAL_CAPSULE | ORAL | 3 refills | Status: DC

## 2022-06-28 NOTE — Progress Notes (Signed)
Subjective:  Patient ID: Brianna [name], female    DOB: October 15, 1925  Age: 88 y.o. MRN: [id number]  CC: Follow-up   HPI Brianna [name] presents for follow-up on the normocytic anemia noted on her recent blood work.  I had also asked him to stop her blood pressure medication.  They have done that and now blood pressure has rebounded.  She still having some swelling in the extremities in spite of stopping the losartan and the amlodipine as well as the hydrochlorothiazide.  There are some lesions on the back of the calves that she is the daughter is concerned about as possible melanoma.  She like to have these checked.  Additionally there are lesions behind each ear that are of concern.     06/28/2022    2:03 PM 05/11/2022    2:02 PM 05/11/2022    1:53 PM  Depression screen PHQ 2/9  Decreased Interest 0 2 0  Down, Depressed, Hopeless 0 1 0  PHQ - 2 Score 0 3 0  Altered sleeping  0   Tired, decreased energy  2   Change in appetite  0   Feeling bad or failure about yourself   0   Trouble concentrating  3   Moving slowly or fidgety/restless  1   Suicidal thoughts  0   PHQ-9 Score  9   Difficult doing work/chores  Very difficult     History Brianna [name] has a past medical history of Bilateral inguinal hernia (BIH), Carpal tunnel syndrome on right, Colon polyp, COPD (chronic obstructive pulmonary disease) ([place]), Essential hypertension, Fibrocystic breast, Frequency of urination, History of skin cancer, Hypothyroidism, Lumbosacral spondylosis without myelopathy, Mixed hyperlipidemia, Myxomatous mitral valve, Obturator hernia, right, Osteoporosis, 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   She has a past surgical history that includes Cesarean section; Appendectomy; Cataract extraction w/ intraocular lens  implant, bilateral; Colon surgery (2004); Ventral hernia repair (11/21/2011); laparoscopy (11/21/2011); Inguinal hernia repair (11/21/2011);  Tonsillectomy; and Total hip arthroplasty (Right, 03/04/2013).   Her family history includes Heart disease in her father and mother; Lung cancer in her brother.She reports that she has never smoked. She has never used smokeless tobacco. She reports that she does not drink alcohol and does not use drugs.    ROS Review of Systems  Objective:  BP (!) 165/69   Pulse 76   Temp 98.1 F (36.7 C)   Ht 5' (1.524 m)   Wt 95 lb 3.2 oz (43.2 kg)   SpO2 96%   BMI 18.59 kg/m   BP Readings from Last 3 Encounters:  06/28/22 (!) 165/69  05/11/22 125/70  01/10/22 100/63    Wt Readings from Last 3 Encounters:  06/28/22 95 lb 3.2 oz (43.2 kg)  05/11/22 94 lb 3.2 oz (42.7 kg)  11/10/21 95 lb 6.4 oz (43.3 kg)     Physical Exam Constitutional:      General: She is not in acute distress.    Appearance: She is well-developed.  Cardiovascular:     Rate and Rhythm: Normal rate and regular rhythm.  Pulmonary:     Breath sounds: Normal breath sounds.  Musculoskeletal:        General: Normal range of motion.  Skin:    General: Skin is warm and dry.     Coloration: Skin is not pale.     Findings: Lesion (There are reddish bruised appearing lesions x3 on the back of the right leg just above the ankle.  These do not have the dark appearance of melanoma.  They look more like bruises or even freckles.  They have a slight reddish tent.) present.     Comments: The lesions behind each ear have the appearance of seborrheic keratoses.  There is some area behind the right ear slightly darkened pigment extending up onto the scalp.  However it does not have any of the blue-black red and white appearance it is not actually a discrete lesion.  Neurological:     Mental Status: She is alert and oriented to person, place, and time.       Assessment & Plan:   Brianna [name] was seen today for follow-up.  Diagnoses and all orders for this visit:  Normocytic anemia  Essential hypertension, benign -      hydrochlorothiazide (MICROZIDE) 12.5 MG capsule; TAKE ONE (1) CAPSULE EACH DAY -     BMP8+EGFR -     Anemia Profile B  Neoplasm of uncertain behavior of skin       I am having Brianna [name] maintain her Vitamin D3, beta carotene w/minerals, Calcium Citrate-Vitamin D (CITRACAL + D PO), Omega-3 Fatty Acids (FISH OIL PO), diclofenac Sodium, mometasone, miconazole, amLODipine, atorvastatin, levothyroxine, losartan, indomethacin, and hydrochlorothiazide.  Allergies as of 06/28/2022       Reactions   Colchicine Diarrhea   Alendronate Sodium Nausea Only   Lodine [etodolac]    unknown   Risedronate Sodium    unknown        Medication List        Accurate as of June 28, 2022  2:46 PM. If you have any questions, ask your nurse or doctor.          amLODipine 5 MG tablet Commonly known as: NORVASC Take 1 tablet (5 mg total) by mouth daily. for blood pressure   atorvastatin 10 MG tablet Commonly known as: LIPITOR Take 0.5 tablets (5 mg total) by mouth at bedtime.   beta carotene w/minerals tablet Take 1 tablet by mouth daily.   CITRACAL + D PO Take 2 tablets by mouth daily.   diclofenac Sodium 1 % Gel Commonly known as: VOLTAREN APPLY 4 GRAMS 4 TIMES DAILY   FISH OIL PO Take 1 tablet by mouth daily.   hydrochlorothiazide 12.5 MG capsule Commonly known as: MICROZIDE TAKE ONE (1) CAPSULE EACH DAY   indomethacin 25 MG capsule Commonly known as: INDOCIN Take 1 capsule (25 mg total) by mouth 2 (two) times daily as needed (gout pain and swelling).   levothyroxine 25 MCG tablet Commonly known as: SYNTHROID TAKE 2 TABLETS DAILY MONDAY-FRIDAY & TAKE 1 TABLET DAILY SATURDAY & SUNDAY   losartan 100 MG tablet Commonly known as: COZAAR TAKE ONE (1) TABLET EACH DAY   miconazole 2 % cream Commonly known as: Micatin Apply 1 application topically 2 (two) times daily.   mometasone 0.1 % cream Commonly known as: Elocon Apply 1 application topically daily. To  affected areas   Vitamin D3 50 MCG (2000 UT) Tabs Take 1 tablet by mouth daily.       Due to the elevation of today's blood pressure, she should go ahead and resume the HCTZ.  We will consider adding back the losartan depending on today's kidney function analysis.  Due to the normocytic anemia we will get a go ahead and do anemia profile and her CBC  Follow-up: Return in about 3 months (around 09/28/2022).  Claretta Fraise, M.D.

## 2022-06-29 LAB — ANEMIA PROFILE B
Basophils Absolute: 0 10*3/uL (ref 0.0–0.2)
Basos: 0 %
EOS (ABSOLUTE): 0 10*3/uL (ref 0.0–0.4)
Eos: 1 %
Ferritin: 321 ng/mL — ABNORMAL HIGH (ref 15–150)
Folate: 2.1 ng/mL — ABNORMAL LOW (ref >3.0)
Hematocrit: 31.7 % — ABNORMAL LOW (ref 34.0–46.6)
Hemoglobin: 10.4 g/dL — ABNORMAL LOW (ref 11.1–15.9)
Immature Grans (Abs): 0 10*3/uL (ref 0.0–0.1)
Immature Granulocytes: 0 %
Iron Saturation: 7 % — CL (ref 15–55)
Iron: 17 ug/dL — ABNORMAL LOW (ref 27–139)
Lymphocytes Absolute: 1.1 10*3/uL (ref 0.7–3.1)
Lymphs: 15 %
MCH: 28.8 pg (ref 26.6–33.0)
MCHC: 32.8 g/dL (ref 31.5–35.7)
MCV: 88 fL (ref 79–97)
Monocytes Absolute: 0.9 10*3/uL (ref 0.1–0.9)
Monocytes: 12 %
Neutrophils Absolute: 5.3 10*3/uL (ref 1.4–7.0)
Neutrophils: 72 %
Platelets: 298 10*3/uL (ref 150–450)
RBC: 3.61 x10E6/uL — ABNORMAL LOW (ref 3.77–5.28)
RDW: 13.5 % (ref 11.7–15.4)
Retic Ct Pct: 1.6 % (ref 0.6–2.6)
Total Iron Binding Capacity: 249 ug/dL — ABNORMAL LOW (ref 250–450)
UIBC: 232 ug/dL (ref 118–369)
Vitamin B-12: 299 pg/mL (ref 232–1245)
WBC: 7.4 10*3/uL (ref 3.4–10.8)

## 2022-06-29 LAB — BMP8+EGFR
BUN/Creatinine Ratio: 21 (ref 12–28)
BUN: 14 mg/dL (ref 10–36)
CO2: 23 mmol/L (ref 20–29)
Calcium: 9.5 mg/dL (ref 8.7–10.3)
Chloride: 100 mmol/L (ref 96–106)
Creatinine, Ser: 0.68 mg/dL (ref 0.57–1.00)
Glucose: 110 mg/dL — ABNORMAL HIGH (ref 70–99)
Potassium: 3.9 mmol/L (ref 3.5–5.2)
Sodium: 139 mmol/L (ref 134–144)
eGFR: 80 mL/min/{1.73_m2} (ref >59)

## 2022-08-27 ENCOUNTER — Other Ambulatory Visit: Payer: Self-pay

## 2022-08-27 ENCOUNTER — Emergency Department (HOSPITAL_COMMUNITY): Payer: Medicare Other

## 2022-08-27 ENCOUNTER — Inpatient Hospital Stay (HOSPITAL_COMMUNITY): Payer: Medicare Other

## 2022-08-27 ENCOUNTER — Inpatient Hospital Stay (HOSPITAL_COMMUNITY)
Admission: EM | Admit: 2022-08-27 | Discharge: 2022-09-05 | DRG: 964 | Disposition: A | Discharge status: Skilled Nursing Facility | Payer: Medicare Other | Attending: Family Medicine | Admitting: Family Medicine

## 2022-08-27 DIAGNOSIS — R000 Tachycardia, unspecified: Secondary | ICD-10-CM | POA: Diagnosis present

## 2022-08-27 DIAGNOSIS — J449 Chronic obstructive pulmonary disease, unspecified: Secondary | ICD-10-CM | POA: Diagnosis not present

## 2022-08-27 DIAGNOSIS — Z515 Encounter for palliative care: Secondary | ICD-10-CM | POA: Diagnosis not present

## 2022-08-27 DIAGNOSIS — E872 Acidosis, unspecified: Secondary | ICD-10-CM | POA: Diagnosis present

## 2022-08-27 DIAGNOSIS — S065X0A Traumatic subdural hemorrhage without loss of consciousness, initial encounter: Secondary | ICD-10-CM | POA: Diagnosis present

## 2022-08-27 DIAGNOSIS — E782 Mixed hyperlipidemia: Secondary | ICD-10-CM | POA: Diagnosis present

## 2022-08-27 DIAGNOSIS — M810 Age-related osteoporosis without current pathological fracture: Secondary | ICD-10-CM | POA: Diagnosis present

## 2022-08-27 DIAGNOSIS — Z043 Encounter for examination and observation following other accident: Secondary | ICD-10-CM | POA: Diagnosis not present

## 2022-08-27 DIAGNOSIS — R410 Disorientation, unspecified: Secondary | ICD-10-CM | POA: Diagnosis not present

## 2022-08-27 DIAGNOSIS — M25552 Pain in left hip: Secondary | ICD-10-CM | POA: Diagnosis not present

## 2022-08-27 DIAGNOSIS — S065X0D Traumatic subdural hemorrhage without loss of consciousness, subsequent encounter: Secondary | ICD-10-CM | POA: Diagnosis not present

## 2022-08-27 DIAGNOSIS — I700 Atherosclerosis of aorta: Secondary | ICD-10-CM | POA: Diagnosis not present

## 2022-08-27 DIAGNOSIS — F0390 Unspecified dementia without behavioral disturbance: Secondary | ICD-10-CM | POA: Diagnosis not present

## 2022-08-27 DIAGNOSIS — R739 Hyperglycemia, unspecified: Secondary | ICD-10-CM | POA: Diagnosis not present

## 2022-08-27 DIAGNOSIS — E860 Dehydration: Secondary | ICD-10-CM | POA: Diagnosis present

## 2022-08-27 DIAGNOSIS — K5900 Constipation, unspecified: Secondary | ICD-10-CM | POA: Diagnosis not present

## 2022-08-27 DIAGNOSIS — Z888 Allergy status to other drugs, medicaments and biological substances status: Secondary | ICD-10-CM

## 2022-08-27 DIAGNOSIS — S72002A Fracture of unspecified part of neck of left femur, initial encounter for closed fracture: Principal | ICD-10-CM | POA: Diagnosis present

## 2022-08-27 DIAGNOSIS — Y92009 Unspecified place in unspecified non-institutional (private) residence as the place of occurrence of the external cause: Secondary | ICD-10-CM

## 2022-08-27 DIAGNOSIS — I059 Rheumatic mitral valve disease, unspecified: Secondary | ICD-10-CM | POA: Diagnosis not present

## 2022-08-27 DIAGNOSIS — W07XXXA Fall from chair, initial encounter: Secondary | ICD-10-CM | POA: Diagnosis present

## 2022-08-27 DIAGNOSIS — Z7189 Other specified counseling: Secondary | ICD-10-CM | POA: Diagnosis not present

## 2022-08-27 DIAGNOSIS — E876 Hypokalemia: Secondary | ICD-10-CM | POA: Diagnosis present

## 2022-08-27 DIAGNOSIS — Z96641 Presence of right artificial hip joint: Secondary | ICD-10-CM | POA: Diagnosis present

## 2022-08-27 DIAGNOSIS — S72002D Fracture of unspecified part of neck of left femur, subsequent encounter for closed fracture with routine healing: Secondary | ICD-10-CM | POA: Diagnosis not present

## 2022-08-27 DIAGNOSIS — F0394 Unspecified dementia, unspecified severity, with anxiety: Secondary | ICD-10-CM | POA: Diagnosis not present

## 2022-08-27 DIAGNOSIS — F03911 Unspecified dementia, unspecified severity, with agitation: Secondary | ICD-10-CM | POA: Diagnosis present

## 2022-08-27 DIAGNOSIS — C189 Malignant neoplasm of colon, unspecified: Secondary | ICD-10-CM | POA: Diagnosis present

## 2022-08-27 DIAGNOSIS — I69391 Dysphagia following cerebral infarction: Secondary | ICD-10-CM | POA: Diagnosis not present

## 2022-08-27 DIAGNOSIS — Z66 Do not resuscitate: Secondary | ICD-10-CM | POA: Diagnosis present

## 2022-08-27 DIAGNOSIS — M8008XD Age-related osteoporosis with current pathological fracture, vertebra(e), subsequent encounter for fracture with routine healing: Secondary | ICD-10-CM | POA: Diagnosis not present

## 2022-08-27 DIAGNOSIS — I739 Peripheral vascular disease, unspecified: Secondary | ICD-10-CM | POA: Diagnosis not present

## 2022-08-27 DIAGNOSIS — Z8719 Personal history of other diseases of the digestive system: Secondary | ICD-10-CM

## 2022-08-27 DIAGNOSIS — I69319 Unspecified symptoms and signs involving cognitive functions following cerebral infarction: Secondary | ICD-10-CM | POA: Diagnosis not present

## 2022-08-27 DIAGNOSIS — E785 Hyperlipidemia, unspecified: Secondary | ICD-10-CM | POA: Diagnosis not present

## 2022-08-27 DIAGNOSIS — Z79890 Hormone replacement therapy (postmenopausal): Secondary | ICD-10-CM

## 2022-08-27 DIAGNOSIS — R1310 Dysphagia, unspecified: Secondary | ICD-10-CM | POA: Diagnosis present

## 2022-08-27 DIAGNOSIS — I10 Essential (primary) hypertension: Secondary | ICD-10-CM | POA: Diagnosis present

## 2022-08-27 DIAGNOSIS — W19XXXA Unspecified fall, initial encounter: Principal | ICD-10-CM

## 2022-08-27 DIAGNOSIS — R2689 Other abnormalities of gait and mobility: Secondary | ICD-10-CM | POA: Diagnosis not present

## 2022-08-27 DIAGNOSIS — Z636 Dependent relative needing care at home: Secondary | ICD-10-CM

## 2022-08-27 DIAGNOSIS — D500 Iron deficiency anemia secondary to blood loss (chronic): Secondary | ICD-10-CM | POA: Diagnosis not present

## 2022-08-27 DIAGNOSIS — S2242XA Multiple fractures of ribs, left side, initial encounter for closed fracture: Secondary | ICD-10-CM | POA: Diagnosis not present

## 2022-08-27 DIAGNOSIS — H9190 Unspecified hearing loss, unspecified ear: Secondary | ICD-10-CM | POA: Diagnosis present

## 2022-08-27 DIAGNOSIS — A419 Sepsis, unspecified organism: Secondary | ICD-10-CM

## 2022-08-27 DIAGNOSIS — S065XAA Traumatic subdural hemorrhage with loss of consciousness status unknown, initial encounter: Secondary | ICD-10-CM | POA: Diagnosis present

## 2022-08-27 DIAGNOSIS — S2243XA Multiple fractures of ribs, bilateral, initial encounter for closed fracture: Secondary | ICD-10-CM | POA: Diagnosis not present

## 2022-08-27 DIAGNOSIS — F411 Generalized anxiety disorder: Secondary | ICD-10-CM | POA: Diagnosis not present

## 2022-08-27 DIAGNOSIS — Z8249 Family history of ischemic heart disease and other diseases of the circulatory system: Secondary | ICD-10-CM

## 2022-08-27 DIAGNOSIS — E039 Hypothyroidism, unspecified: Secondary | ICD-10-CM | POA: Diagnosis not present

## 2022-08-27 DIAGNOSIS — Z96649 Presence of unspecified artificial hip joint: Secondary | ICD-10-CM

## 2022-08-27 DIAGNOSIS — M50321 Other cervical disc degeneration at C4-C5 level: Secondary | ICD-10-CM | POA: Diagnosis not present

## 2022-08-27 DIAGNOSIS — Z79899 Other long term (current) drug therapy: Secondary | ICD-10-CM

## 2022-08-27 DIAGNOSIS — Z85828 Personal history of other malignant neoplasm of skin: Secondary | ICD-10-CM

## 2022-08-27 DIAGNOSIS — M79642 Pain in left hand: Secondary | ICD-10-CM | POA: Diagnosis not present

## 2022-08-27 DIAGNOSIS — Z85048 Personal history of other malignant neoplasm of rectum, rectosigmoid junction, and anus: Secondary | ICD-10-CM

## 2022-08-27 DIAGNOSIS — J9811 Atelectasis: Secondary | ICD-10-CM | POA: Diagnosis not present

## 2022-08-27 DIAGNOSIS — M6281 Muscle weakness (generalized): Secondary | ICD-10-CM | POA: Diagnosis not present

## 2022-08-27 DIAGNOSIS — M1990 Unspecified osteoarthritis, unspecified site: Secondary | ICD-10-CM | POA: Diagnosis present

## 2022-08-27 DIAGNOSIS — M25559 Pain in unspecified hip: Secondary | ICD-10-CM | POA: Diagnosis present

## 2022-08-27 DIAGNOSIS — Z9181 History of falling: Secondary | ICD-10-CM

## 2022-08-27 DIAGNOSIS — Z5320 Procedure and treatment not carried out because of patient's decision for unspecified reasons: Secondary | ICD-10-CM

## 2022-08-27 DIAGNOSIS — R0602 Shortness of breath: Secondary | ICD-10-CM | POA: Diagnosis not present

## 2022-08-27 DIAGNOSIS — Z801 Family history of malignant neoplasm of trachea, bronchus and lung: Secondary | ICD-10-CM

## 2022-08-27 LAB — URINALYSIS, ROUTINE W REFLEX MICROSCOPIC
Bacteria, UA: NONE SEEN
Bilirubin Urine: NEGATIVE
Glucose, UA: 150 mg/dL — AB
Ketones, ur: NEGATIVE mg/dL
Leukocytes,Ua: NEGATIVE
Nitrite: NEGATIVE
Protein, ur: NEGATIVE mg/dL
Specific Gravity, Urine: 1.011 (ref 1.005–1.030)
pH: 7 (ref 5.0–8.0)

## 2022-08-27 LAB — CBC WITH DIFFERENTIAL/PLATELET
Abs Immature Granulocytes: 0.04 10*3/uL (ref 0.00–0.07)
Basophils Absolute: 0 10*3/uL (ref 0.0–0.1)
Basophils Relative: 1 %
Eosinophils Absolute: 0.1 10*3/uL (ref 0.0–0.5)
Eosinophils Relative: 1 %
HCT: 35 % — ABNORMAL LOW (ref 36.0–46.0)
Hemoglobin: 10.9 g/dL — ABNORMAL LOW (ref 12.0–15.0)
Immature Granulocytes: 1 %
Lymphocytes Relative: 47 %
Lymphs Abs: 3.2 10*3/uL (ref 0.7–4.0)
MCH: 28 pg (ref 26.0–34.0)
MCHC: 31.1 g/dL (ref 30.0–36.0)
MCV: 90 fL (ref 80.0–100.0)
Monocytes Absolute: 0.4 10*3/uL (ref 0.1–1.0)
Monocytes Relative: 6 %
Neutro Abs: 2.9 10*3/uL (ref 1.7–7.7)
Neutrophils Relative %: 44 %
Platelets: 232 10*3/uL (ref 150–400)
RBC: 3.89 MIL/uL (ref 3.87–5.11)
RDW: 16.7 % — ABNORMAL HIGH (ref 11.5–15.5)
WBC: 6.6 10*3/uL (ref 4.0–10.5)
nRBC: 0 % (ref 0.0–0.2)

## 2022-08-27 LAB — COMPREHENSIVE METABOLIC PANEL
ALT: 23 U/L (ref 0–44)
AST: 37 U/L (ref 15–41)
Albumin: 3.1 g/dL — ABNORMAL LOW (ref 3.5–5.0)
Alkaline Phosphatase: 82 U/L (ref 38–126)
Anion gap: 17 — ABNORMAL HIGH (ref 5–15)
BUN: 24 mg/dL — ABNORMAL HIGH (ref 8–23)
CO2: 17 mmol/L — ABNORMAL LOW (ref 22–32)
Calcium: 8.9 mg/dL (ref 8.9–10.3)
Chloride: 101 mmol/L (ref 98–111)
Creatinine, Ser: 1.02 mg/dL — ABNORMAL HIGH (ref 0.44–1.00)
GFR, Estimated: 51 mL/min — ABNORMAL LOW (ref >60)
Glucose, Bld: 222 mg/dL — ABNORMAL HIGH (ref 70–99)
Potassium: 3 mmol/L — ABNORMAL LOW (ref 3.5–5.1)
Sodium: 135 mmol/L (ref 135–145)
Total Bilirubin: 0.5 mg/dL (ref 0.3–1.2)
Total Protein: 6.9 g/dL (ref 6.5–8.1)

## 2022-08-27 LAB — LACTIC ACID, PLASMA
Lactic Acid, Venous: 5 mmol/L (ref 0.5–1.9)
Lactic Acid, Venous: 4.2 mmol/L (ref 0.5–1.9)
Lactic Acid, Venous: 4.2 mmol/L (ref 0.5–1.9)

## 2022-08-27 LAB — LIPASE, BLOOD: Lipase: 41 U/L (ref 11–51)

## 2022-08-27 LAB — BETA-HYDROXYBUTYRIC ACID: Beta-Hydroxybutyric Acid: 0.05 mmol/L (ref 0.05–0.27)

## 2022-08-27 MED ORDER — MILK AND MOLASSES ENEMA
1.0000 | Freq: Once | RECTAL | Status: AC
  Administered 2022-08-27: 240 mL via RECTAL
  Filled 2022-08-27: qty 240

## 2022-08-27 MED ORDER — SENNOSIDES-DOCUSATE SODIUM 8.6-50 MG PO TABS
1.0000 | ORAL_TABLET | Freq: Two times a day (BID) | ORAL | Status: DC
  Administered 2022-08-27 – 2022-08-29 (×5): 1 via ORAL
  Filled 2022-08-27 (×7): qty 1

## 2022-08-27 MED ORDER — LACTATED RINGERS IV BOLUS
1000.0000 mL | Freq: Once | INTRAVENOUS | Status: AC
  Administered 2022-08-27: 1000 mL via INTRAVENOUS

## 2022-08-27 MED ORDER — LACTATED RINGERS IV SOLN: INTRAVENOUS | Status: AC

## 2022-08-27 MED ORDER — OXYCODONE HCL 5 MG PO TABS
5.0000 mg | ORAL_TABLET | Freq: Four times a day (QID) | ORAL | Status: DC | PRN
  Administered 2022-08-29 – 2022-09-05 (×4): 5 mg via ORAL
  Filled 2022-08-27 (×5): qty 1

## 2022-08-27 MED ORDER — SODIUM CHLORIDE 0.9 % IV SOLN
2.0000 g | Freq: Once | INTRAVENOUS | Status: AC
  Administered 2022-08-27: 2 g via INTRAVENOUS
  Filled 2022-08-27: qty 12.5

## 2022-08-27 MED ORDER — ACETAMINOPHEN 650 MG RE SUPP
650.0000 mg | Freq: Four times a day (QID) | RECTAL | Status: DC
  Administered 2022-08-30: 650 mg via RECTAL
  Filled 2022-08-27 (×2): qty 1

## 2022-08-27 MED ORDER — ACETAMINOPHEN 325 MG PO TABS
650.0000 mg | ORAL_TABLET | Freq: Four times a day (QID) | ORAL | Status: DC
  Administered 2022-08-27 – 2022-09-05 (×23): 650 mg via ORAL
  Filled 2022-08-27 (×29): qty 2

## 2022-08-27 MED ORDER — POTASSIUM CHLORIDE 10 MEQ/100ML IV SOLN
10.0000 meq | INTRAVENOUS | Status: AC
  Administered 2022-08-27 (×3): 10 meq via INTRAVENOUS
  Filled 2022-08-27 (×3): qty 100

## 2022-08-27 MED ORDER — FENTANYL CITRATE PF 50 MCG/ML IJ SOSY
12.5000 ug | PREFILLED_SYRINGE | INTRAMUSCULAR | Status: DC | PRN
  Administered 2022-08-28 (×2): 12.5 ug via INTRAVENOUS
  Filled 2022-08-27 (×2): qty 1

## 2022-08-27 MED ORDER — IOHEXOL 300 MG/ML  SOLN
80.0000 mL | Freq: Once | INTRAMUSCULAR | Status: AC | PRN
  Administered 2022-08-27: 80 mL via INTRAVENOUS

## 2022-08-27 MED ORDER — VANCOMYCIN HCL 1500 MG/300ML IV SOLN
1500.0000 mg | Freq: Once | INTRAVENOUS | Status: AC
  Administered 2022-08-27: 1500 mg via INTRAVENOUS
  Filled 2022-08-27: qty 300

## 2022-08-27 MED ORDER — ONDANSETRON HCL 4 MG/2ML IJ SOLN: 4.0000 mg | Freq: Four times a day (QID) | INTRAMUSCULAR | Status: DC | PRN

## 2022-08-27 MED ORDER — ONDANSETRON HCL 4 MG PO TABS: 4.0000 mg | ORAL_TABLET | Freq: Four times a day (QID) | ORAL | Status: DC | PRN

## 2022-08-27 MED ORDER — MAGNESIUM SULFATE IN D5W 1-5 GM/100ML-% IV SOLN
1.0000 g | Freq: Once | INTRAVENOUS | Status: AC
  Administered 2022-08-27: 1 g via INTRAVENOUS
  Filled 2022-08-27: qty 100

## 2022-08-27 MED ORDER — HALOPERIDOL LACTATE 5 MG/ML IJ SOLN
1.0000 mg | Freq: Four times a day (QID) | INTRAMUSCULAR | Status: DC | PRN
  Administered 2022-08-28: 1 mg via INTRAVENOUS
  Filled 2022-08-27: qty 1

## 2022-08-27 NOTE — Assessment & Plan Note (Signed)
-   Pain management ordered palliative consult

## 2022-08-27 NOTE — Assessment & Plan Note (Signed)
-   Daughter reports patient had a difficult time recovering from this surgery 10 years ago and against going forward with surgery for the acute fracture

## 2022-08-27 NOTE — ED Notes (Signed)
Patient transported to CT 

## 2022-08-27 NOTE — Assessment & Plan Note (Signed)
-   Atorvastatin daily reordered from home doses

## 2022-08-27 NOTE — Assessment & Plan Note (Signed)
-   Large amount of stool noted in the colon, ordered a milk of molasses enema and oral laxatives

## 2022-08-27 NOTE — Assessment & Plan Note (Signed)
-   Discussed with orthopedics, high risk fracture and patient is a poor operative candidate and family is against surgical repair and elected to conservative management -Ortho recommends nonweightbearing 4 to 6 weeks - Patient will need alternative living arrangements given that patient's daughter cannot manage or care for her in this condition - Given high mortality associated with this fracture, consult palliative medicine

## 2022-08-27 NOTE — ED Notes (Signed)
Date and time results received: 08/27/22 11:32 AM  (use smartphrase ".now" to insert current time)  Test: Lactic acid Critical Value: 5.0  Name of Provider Notified: Dr. Oswald Hillock  Orders Received? Or Actions Taken?: see chart

## 2022-08-27 NOTE — Hospital Course (Signed)
87 year old female living with her daughter with dementia and very hard of hearing has not been acting her self for the last 3 to 4 days.  She has been wandering at home confused with very poor oral intake.  Patient was going to check the weather outside this morning and ended up falling on the ground.  She fell onto her left side.  She has been crying out and confused since the fall.  Unsure if she hit her head or not.  She complains of left-sided hip pain and groin pain.  X-rays confirm an acute displaced left femoral neck fracture.  Head CT also reported a tiny subdural hematoma.  She was noted to be dehydrated.  There was a large amount of stool in the colon.  She was hypokalemic and had a lactic acid of 5.0.  Orthopedics Dr. Amedeo Kinsman was consulted who reported that this is a high risk fracture and patient is not a candidate for repair.  Family does not want to proceed with surgical repair given her core morbidities and that her last hip repair was a very difficult recovery process for her several years ago.  They agreed to making her DNR and to prefer the conservative management.  Her daughter can no longer care for her at home.  Her daughter has Parkinson's disease and unable to manage her in this condition.  Orthopedics recommended that patient be nonweightbearing for the next 4 to 6 weeks.  Patient is being admitted for further management.

## 2022-08-27 NOTE — Assessment & Plan Note (Signed)
-   Stable on levothyroxine

## 2022-08-27 NOTE — ED Triage Notes (Signed)
Pt arrived via [facility] c/o fall from trying to get out of the chair after breakfast, Per EMS, daughter witnessed fall. Pt appears confused; However, per EMS, daughter states this is pt's baseline behavior. [T appears to be in pain. Hx of hypertension, EMS states BP 206/100

## 2022-08-27 NOTE — Assessment & Plan Note (Signed)
Home meds °

## 2022-08-27 NOTE — Assessment & Plan Note (Signed)
-   Likely secondary to dehydration, hydrating with IV fluids, recheck lactate

## 2022-08-27 NOTE — Assessment & Plan Note (Signed)
-   Managed with calcium and vitamin D supplementation

## 2022-08-27 NOTE — Assessment & Plan Note (Signed)
-   IV replacement ordered, recheck in morning

## 2022-08-27 NOTE — ED Provider Notes (Signed)
[facility] EMERGENCY DEPARTMENT Provider Note   CSN: [id number] Arrival date & time: 08/27/22  0900     History Chief Complaint  Patient presents with   Fall    HPI NERIAH BROTT is a 88 y.o. female presenting for altered mental status.  Patient had a ground-level fall this morning.  Patient was getting up to go check the weather outside when she fell onto her left side.  She has been confused and crying out since the event happened.  She has a history of dementia and lives with her daughter.  She has not been eating and drinking well over the past few weeks.  No complaints prior to today however.  Patient grossly disoriented and all history is collected by the daughter.   Patient's recorded medical, surgical, social, medication list and allergies were reviewed in the Snapshot window as part of the initial history.   Review of Systems   Review of Systems  Unable to perform ROS: Dementia    Physical Exam Updated Vital Signs BP 133/86   Pulse 96   Temp 97.8 F (36.6 C) (Axillary)   Resp (!) 24   Ht 5' (1.524 m)   Wt 43.2 kg   SpO2 100%   BMI 18.60 kg/m  Physical Exam  Neurologic: GCS 14, motor intact in all four extremities, sensory intact in all 4 extremities  Head: Pupils are 48m, equally round and reactive to light, patient has no obvious facial trauma, no hemotympanum  Neck: patient has no midline neck tenderness, no obvious injuries.  Thorax: Patient has stable clavicles, stable thorax with bilateral chest rise and breath sounds heard.  No penetrating thoracic injury.  CV/Pulm: RRR, no audible murmer/rubs/gallops, CTAB  Abdomen: Patient has no abdominal distention, no penetrating abdominal injury.  Back: Patient has no midline spinal tenderness in the thoracic and lumbar spine, patient has no paraspinal tenderness bilaterally.  Pelvis: Patient has a stable pelvis to compression with palpable femoral pulses.  Substantially tender to palpation left  hip Extremities:Patient's upper extremities with no obvious injury or abnormality, radial pulses present. Patient's lower extremities with no obvious injury or abnormality, tibial pulses present.     ED Course/ Medical Decision Making/ A&P Clinical Course as of 08/27/22 1436  Sun Aug 27, 2022  1230 Discussed with orthopedic surgery- CAmedeo Kinsman  This is a high risk hip fracture and patient with clinical evidence of sepsis would not be a good candidate for repair.  Family does not want to proceed with surgical intervention given patient's comorbid medical problems.  If patient has recovery from a medical perspective, discussions related to hip replacement could be discussed further. [CC]    Clinical Course User Index [CC] CTretha Sciara MD    Procedures .Critical Care  Performed by: CTretha Sciara MD Authorized by: CTretha Sciara MD   Critical care provider statement:    Critical care time (minutes):  55  Critical care was necessary to treat or prevent imminent or life-threatening deterioration of the following conditions:  CNS failure or compromise, trauma and sepsis   Critical care was time spent personally by me on the following activities:  Development of treatment plan with patient or surrogate, discussions with consultants, evaluation of patient's response to treatment, examination of patient, ordering and review of laboratory studies, ordering and review of radiographic studies, ordering and performing treatments and interventions, pulse oximetry, re-evaluation of patient's condition and review of old charts   Care discussed with: admitting provider  Medications Ordered in ED Medications  potassium chloride 10 mEq in 100 mL IVPB (10 mEq Intravenous New Bag/Given 08/27/22 1356)  lactated ringers infusion (has no administration in time range)  acetaminophen (TYLENOL) tablet 650 mg (has no administration in time range)    Or  acetaminophen (TYLENOL) suppository 650 mg  (has no administration in time range)  oxyCODONE (Oxy IR/ROXICODONE) immediate release tablet 5 mg (has no administration in time range)  fentaNYL (SUBLIMAZE) injection 12.5 mcg (has no administration in time range)  milk and molasses enema (has no administration in time range)  ondansetron (ZOFRAN) tablet 4 mg (has no administration in time range)    Or  ondansetron (ZOFRAN) injection 4 mg (has no administration in time range)  senna-docusate (Senokot-S) tablet 1 tablet (has no administration in time range)  lactated ringers bolus 1,000 mL (1,000 mLs Intravenous Bolus 08/27/22 0954)  vancomycin (VANCOREADY) IVPB 1500 mg/300 mL (0 mg Intravenous Stopped 08/27/22 1400)  ceFEPIme (MAXIPIME) 2 g in sodium chloride 0.9 % 100 mL IVPB (2 g Intravenous New Bag/Given 08/27/22 1340)  lactated ringers bolus 1,000 mL (0 mLs Intravenous Stopped 08/27/22 1404)  magnesium sulfate IVPB 1 g 100 mL (0 g Intravenous Stopped 08/27/22 1404)  iohexol (OMNIPAQUE) 300 MG/ML solution 80 mL (80 mLs Intravenous Contrast Given 08/27/22 1246)    Medical Decision Making:    SPENCER PETERKIN is a 88 y.o. female who presented to the ED today with GLF detailed above.     Patient's presentation is complicated by their history of comorbid dementia, multiple comorbid medical problems, advanced age.  Patient placed on continuous vitals and telemetry monitoring while in ED which was reviewed periodically.   Complete initial physical exam performed, notably the patient  was grossly confused, tachycardic to the 150s initially improving with IV fluids.      Reviewed and confirmed nursing documentation for past medical history, family history, social history.    Initial Assessment:   This is a critically ill patient presenting with multiple comorbid problems. Concerning patient's fall, I have considered intracranial hemorrhage, intracranial mass, meningitis, CVA, blunt traumatic injury to the chest or pelvis.  Long bone fracture  also considered.  Broad screening evaluation was initiated for the fall including CT chest abdomen pelvis with contrast, CT head, CT C-spine, pelvis x-ray.  Pelvis and hip x-rays demonstrate left femoral neck fracture.  I discussed the case with the daughter who is at bedside.  She would not want to undergo any surgical intervention because the patient has undergone a prior hip fracture surgery that went poorly and patient had a very long and painful recovery.  I consulted orthopedic surgery regarding care for a nonoperative hip fracture and they recommended nonweightbearing and physical therapy evaluation at this time.  They stated the patient could be evaluated for transfer to a center that could manage her hip fracture if she medically stabilizes. CT head did also demonstrate a subdural hemorrhage that is trace at this time.  Patient is not on blood thinners.  No acute indication for neurosurgical evaluation given patient's comorbid problems and current presentation.  Concerning patient's altered mental status, considered intracranial hemorrhage as etiology however with the tachycardia, sepsis, urinary tract infection, metabolic disruption are also considered possible.  Evaluated broadly with urinalysis, screening labs including CBC, metabolic panel, CT chest abdomen pelvis to evaluate for etiology of sepsis.  Patient's lactic acid resulted elevated.  She had an anion gap acidosis which is likely reflected by patient's elevated lactic acidosis.  Treated  patient with IV fluids 2 L for suspected dehydration.  However given initial tachycardia, tachypnea and lactic acid elevation, sepsis remains on the differential.  Blood cultures drawn and patient started on IV vancomycin and cefepime until source is identified.    Had a prolonged conversation with patient's daughter regarding her current clinical status.  Patient has been deteriorating in the outpatient setting over the past week and now has 3 new severe  acute medical problems.  This is including acute intracranial hemorrhage, acute sepsis, and acute left hip fracture.  Based upon the severe nature of the patient's presentation, the family would like to proceed with making the patient DO NOT RESUSCITATE.  This was placed as an order in the medical record.  Patient would not want to be on a ventilator after this conversation but would still want to pursue full medical therapies outside of intubation and cardiopulmonary resuscitation at this time.  Given patient's Multiple comorbid medical conditions, patient is going to require advanced inpatient care and management.  Consulted the hospitalist team who agreed with need for admission and patient was admitted for further care and management.   Clinical Impression:  1. Fall, initial encounter   2. SDH (subdural hematoma) (HCC)   3. Closed fracture of left hip, initial encounter ([place])   4. Sepsis, due to unspecified organism, unspecified whether acute organ dysfunction present [facility])      Admit   Final Clinical Impression(s) / ED Diagnoses Final diagnoses:  Fall, initial encounter  SDH (subdural hematoma) ([place])  Closed fracture of left hip, initial encounter ([place])  Sepsis, due to unspecified organism, unspecified whether acute organ dysfunction present [facility])    Rx / DC Orders ED Discharge Orders     None         Tretha Sciara, MD 08/27/22 1436

## 2022-08-27 NOTE — Assessment & Plan Note (Addendum)
-   Given dementia, fall precautions recommended, patient should be on bedrest now to ambulate only with assistance

## 2022-08-27 NOTE — H&P (Signed)
History and Physical  [place] WLS:[id number] DOB: [birth year] DOA: 08/27/2022  PCP: Claretta Fraise, MD  Patient coming from: Home with daughter Level of care: Med-Surg  I have personally briefly reviewed patient's old medical records in [place]  Chief Complaint: Fall at home   HPI: Brianna [name] is a 88 year old female living with her daughter with dementia and very hard of hearing has not been acting her self for the last 3 to 4 days.  She has been wandering at home confused with very poor oral intake.  Patient was going to check the weather outside this morning and ended up falling on the ground.  She fell onto her left side.  She has been crying out and confused since the fall.  Unsure if she hit her head or not.  She complains of left-sided hip pain and groin pain.  X-rays confirm an acute displaced left femoral neck fracture.  Head CT also reported a tiny subdural hematoma.  She was noted to be dehydrated.  There was a large amount of stool in the colon.  She was hypokalemic and had a lactic acid of 5.0.  Orthopedics Dr. Amedeo Kinsman was consulted who reported that this is a high risk fracture and patient is not a candidate for repair.  Family does not want to proceed with surgical repair given her core morbidities and that her last hip repair was a very difficult recovery process for her several years ago.  They agreed to making her DNR and to prefer the conservative management.  Her daughter can no longer care for her at home.  Her daughter has Parkinson's disease and unable to manage her in this condition.  Orthopedics recommended that patient be nonweightbearing for the next 4 to 6 weeks.  Patient is being admitted for further management.   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, 2004   Symptomatic menopausal or female climacteric states    Ventral hernia     Past Surgical History:  Procedure Laterality Date   APPENDECTOMY     CATARACT EXTRACTION W/ INTRAOCULAR LENS  IMPLANT, BILATERAL     CESAREAN SECTION     COLON SURGERY  2004   8'04-open LAR for rectosigmoid cancer T1N0 within polyp   INGUINAL HERNIA REPAIR  11/21/2011   Procedure: LAPAROSCOPIC BILATERAL INGUINAL HERNIA REPAIR;  Surgeon: Adin Hector, MD;  Location: [facility];  Service: General;  Laterality: N/A;   LAPAROSCOPY  11/21/2011   R obturator hernia repair   TONSILLECTOMY     TOTAL HIP ARTHROPLASTY Right 03/04/2013   Procedure: RIGHT TOTAL HIP ARTHROPLASTY ANTERIOR APPROACH;  Surgeon: Mauri Pole, MD;  Location: [facility];  Service: Orthopedics;  Laterality: Right;   VENTRAL HERNIA REPAIR  11/21/2011   Procedure: LAPAROSCOPIC VENTRAL HERNIA;  Surgeon: Adin Hector, MD;  Location: [facility];  Service: General;  Laterality: N/A;  laparoscopic bilateral inguinal hernia repair with mesh and ventral hernia repair with mesh     reports that she has never smoked. She has never used smokeless tobacco. She reports that she does not drink alcohol and does not use drugs.  Allergies  Allergen Reactions   Colchicine Diarrhea   Alendronate Sodium Nausea Only   Lodine [Etodolac]     unknown   Risedronate Sodium     unknown    Family History  Problem Relation Age of Onset   Heart disease Mother    Heart disease Father    Lung cancer Brother     Prior to Admission medications   Medication Sig Start Date End Date Taking? Authorizing Provider  amLODipine (NORVASC) 5 MG tablet Take 1  tablet (5 mg total) by mouth daily. for blood pressure 05/11/22   Claretta Fraise, MD  atorvastatin (LIPITOR) 10 MG tablet Take 0.5 tablets (5 mg total) by mouth at bedtime. 05/11/22   Claretta Fraise, MD  beta carotene w/minerals (OCUVITE) tablet Take 1 tablet by mouth daily.    [provider]  Calcium Citrate-Vitamin D (CITRACAL + D PO) Take 2 tablets by mouth daily.     [provider]  Cholecalciferol (VITAMIN D3) 2000 UNITS TABS Take 1 tablet by mouth daily.    [provider]  diclofenac Sodium (VOLTAREN) 1 % GEL APPLY 4 GRAMS 4 TIMES DAILY 07/26/21   Claretta Fraise, MD  hydrochlorothiazide (MICROZIDE) 12.5 MG capsule TAKE ONE (1) CAPSULE EACH DAY 06/28/22   Claretta Fraise, MD  indomethacin (INDOCIN) 25 MG capsule Take 1 capsule (25 mg total) by mouth 2 (two) times daily as needed (gout pain and swelling). 05/21/22   Claretta Fraise, MD  levothyroxine (SYNTHROID) 25 MCG tablet TAKE 2 TABLETS DAILY MONDAY-FRIDAY & TAKE 1 TABLET DAILY SATURDAY & SUNDAY 05/11/22   Claretta Fraise, MD  losartan (COZAAR) 100 MG tablet TAKE ONE (1) TABLET EACH DAY 05/11/22   Claretta Fraise, MD  miconazole (MICATIN) 2 % cream Apply 1 application topically 2 (two) times daily. 12/30/21   Dettinger, Fransisca Kaufmann, MD  mometasone (ELOCON) 0.1 % cream Apply 1 application topically daily. To affected areas 11/10/21   Claretta Fraise, MD  Omega-3 Fatty Acids (FISH OIL PO) Take 1 tablet by mouth daily.    [provider]    Physical Exam: Vitals:   08/27/22 1347 08/27/22 1400 08/27/22 1430 08/27/22 1500  BP:  (!) 157/93 (!) 124/101 121/83  Pulse: 96 95 91   Resp: (!) 24 (!) 21 (!) 26 (!) 21  Temp: 97.8 F (36.6 C)     TempSrc: Axillary     SpO2: 100% 99% 95% 100%  Weight:      Height:        Constitutional: hard of hearing, frail, confused with dementia, NAD, calm, comfortable Eyes: PERRL, lids and conjunctivae normal ENMT: Mucous membranes are dry Posterior pharynx clear of any exudate or  lesions Neck: normal, supple, no masses, no thyromegaly Respiratory: clear to auscultation bilaterally, no wheezing, no crackles. Normal respiratory effort. No accessory muscle use.  Cardiovascular: normal s1, s2 sounds, no murmurs / rubs / gallops. No extremity edema. 2+ pedal pulses. No carotid bruits.  Abdomen: no tenderness, no masses palpated. No hepatosplenomegaly. Bowel sounds positive.  Musculoskeletal: no clubbing / cyanosis. Left leg externally rotated.   Skin: no rashes, lesions, ulcers. No induration Neurologic: CN 2-12 grossly intact. Sensation intact, DTR normal. Strength 5/5 in all 4.  Psychiatric: Poor judgment and insight (dementia).  Alert but confused. Normal mood.   Labs on Admission: I have personally reviewed following labs and imaging studies  CBC: Recent Labs  Lab 08/27/22 0914  WBC 6.6  NEUTROABS 2.9  HGB 10.9*  HCT 35.0*  MCV 90.0  PLT 599   Basic Metabolic Panel: Recent Labs  Lab 08/27/22 0914  NA 135  K 3.0*  CL 101  CO2 17*  GLUCOSE 222*  BUN 24*  CREATININE 1.02*  CALCIUM 8.9   GFR: Estimated Creatinine Clearance: 22.5 mL/min (A) (by C-G formula based on SCr of 1.02 mg/dL (H)). Liver Function Tests: Recent Labs  Lab 08/27/22 0914  AST 37  ALT 23  ALKPHOS 82  BILITOT 0.5  PROT 6.9  ALBUMIN 3.1*   Recent Labs  Lab 08/27/22 0914  LIPASE 41   No results for input(s): "AMMONIA" in the last 168 hours. Coagulation Profile: No results for input(s): "INR", "PROTIME" in the last 168 hours. Cardiac Enzymes: No results for input(s): "CKTOTAL", "CKMB", "CKMBINDEX", "TROPONINI" in the last 168 hours. BNP (last 3 results) No results for input(s): "PROBNP" in the last 8760 hours. HbA1C: No results for input(s): "HGBA1C" in the last 72 hours. CBG: No results for input(s): "GLUCAP" in the last 168 hours. Lipid Profile: No results for input(s): "CHOL", "HDL", "LDLCALC", "TRIG", "CHOLHDL", "LDLDIRECT" in the last 72 hours. Thyroid Function  Tests: No results for input(s): "TSH", "T4TOTAL", "FREET4", "T3FREE", "THYROIDAB" in the last 72 hours. Anemia Panel: No results for input(s): "VITAMINB12", "FOLATE", "FERRITIN", "TIBC", "IRON", "RETICCTPCT" in the last 72 hours. Urine analysis:    Component Value Date/Time   COLORURINE STRAW (A) 08/27/2022 1035   APPEARANCEUR CLEAR 08/27/2022 1035   APPEARANCEUR Clear 01/02/2022 1440   LABSPEC 1.011 08/27/2022 1035   PHURINE 7.0 08/27/2022 1035   GLUCOSEU 150 (A) 08/27/2022 1035   HGBUR SMALL (A) 08/27/2022 1035   BILIRUBINUR NEGATIVE 08/27/2022 1035   BILIRUBINUR Negative 01/02/2022 1440   KETONESUR NEGATIVE 08/27/2022 1035   PROTEINUR NEGATIVE 08/27/2022 1035   UROBILINOGEN negative 11/04/2013 0904   UROBILINOGEN 0.2 02/25/2013 1501   NITRITE NEGATIVE 08/27/2022 1035   LEUKOCYTESUR NEGATIVE 08/27/2022 1035    Radiological Exams on Admission: CT CHEST ABDOMEN PELVIS W CONTRAST  Result Date: 08/27/2022 CLINICAL DATA:  Sepsis. Elevated lactate. Patient fell from kitchen chair. EXAM: CT CHEST, ABDOMEN, AND PELVIS WITH CONTRAST TECHNIQUE: Multidetector CT imaging of the chest, abdomen and pelvis was performed following the standard protocol during bolus administration of intravenous contrast. RADIATION DOSE REDUCTION: This exam was performed according to the departmental dose-optimization program which includes automated exposure control, adjustment of the mA and/or kV according to patient size and/or use of iterative reconstruction technique. CONTRAST:  59m OMNIPAQUE IOHEXOL 300 MG/ML  SOLN COMPARISON:  None Available. FINDINGS: CT CHEST FINDINGS Cardiovascular: The heart is enlarged. Moderate atherosclerotic calcification is noted in the wall of the thoracic aorta. Enlargement of the pulmonary outflow tract/main pulmonary arteries suggests pulmonary arterial hypertension. Mediastinum/Nodes: No mediastinal lymphadenopathy. There is no hilar lymphadenopathy. The esophagus has normal  imaging features. There is no axillary lymphadenopathy. Lungs/Pleura: Interlobular septal thickening noted in the upper lobes bilaterally with atelectasis in the right middle lobe and both lower lobes. No suspicious pulmonary nodule or mass. No pleural effusion. Musculoskeletal: Multiple nonacute left-sided rib fractures evident nonacute fractures posterior tenth and eleventh ribs noted on the right. Multiple nonacute left rib fractures evident. CT ABDOMEN PELVIS FINDINGS Hepatobiliary: No suspicious focal abnormality within the liver parenchyma. Gallbladder is distended with layering tiny calcified stones  in the fundal region. No intrahepatic or extrahepatic biliary dilation. Pancreas: Main pancreatic duct is diffusely prominent measuring up to 3 mm diameter. 4 mm hypodensity noted in the body of pancreas (62/2), likely cystic. Spleen: No splenomegaly. No focal mass lesion. Adrenals/Urinary Tract: No adrenal nodule or mass. 15 mm low-density lesion interpolar right kidney is likely a cyst. No followup imaging is recommended. Left kidney unremarkable. Mild fullness noted in the right ureter. Bladder is markedly distended. Stomach/Bowel: Stomach is moderately distended with gas and fluid. Wall thickening in the antral region may be related to peristalsis although infection/inflammation could have this appearance. Duodenum is normally positioned as is the ligament of Treitz. No small bowel wall thickening. No small bowel dilatation. The terminal ileum is normal. The appendix is not well visualized, but there is no edema or inflammation in the region of the cecum. No gross colonic mass. No colonic wall thickening. Moderate to large stool volume noted. Vascular/Lymphatic: There is moderate atherosclerotic calcification of the abdominal aorta without aneurysm. There is no gastrohepatic or hepatoduodenal ligament lymphadenopathy. No retroperitoneal or mesenteric lymphadenopathy. No pelvic sidewall lymphadenopathy.  Reproductive: There is no adnexal mass. Other: No intraperitoneal free fluid. Musculoskeletal: Status post right hip replacement. Left femoral neck fracture with varus angulation evident. No worrisome lytic or sclerotic osseous abnormality. Thoracolumbar scoliosis evident. IMPRESSION: 1. Left femoral neck fracture with varus angulation. 2. Interlobular septal thickening in the upper lobes bilaterally suggesting component of edema. Atelectasis in the right middle lobe and both lower lobes. 3. Cardiomegaly. Enlargement of the pulmonary outflow tract/main pulmonary arteries suggests pulmonary arterial hypertension. 4. Cholelithiasis. 5. Wall thickening in the antral region may be related to peristalsis although infection/inflammation could have this appearance. 6. Moderate to large stool volume. Imaging features can be seen in the setting of clinical constipation. 7. 4 mm hypodensity in the body of the pancreas, likely a tiny cyst. Given patient age and lesion size, follow-up imaging in 2 years recommended to ensure stability. This recommendation follows ACR consensus guidelines: Management of Incidental Pancreatic Cysts: A White Paper of the ACR Incidental Findings Committee. J Am Coll Radiol 7654;65:035-465. 8.  Aortic Atherosclerosis (ICD10-I70.0). Electronically Signed   By: Misty Stanley M.D.   On: 08/27/2022 13:24   DG HIP UNILAT W OR W/O PELVIS 2-3 VIEWS LEFT  Result Date: 08/27/2022 CLINICAL DATA:  Fall. Left hip pain. Left hip fracture on current pelvis radiographs. EXAM: DG HIP (WITH OR WITHOUT PELVIS) 2-3V LEFT COMPARISON:  None Available. FINDINGS: Mildly displaced fracture of the mid left femoral neck, distal fracture component displacing superiorly by proximally 1.3 cm. Mild varus angulation. No other fracture.  Left hip joint is normally aligned. No bone lesion.  Skeletal structures are diffusely IMPRESSION: 1. Acute mildly displaced fracture of the left femoral neck with mild varus angulation.  Electronically Signed   By: Lajean Manes M.D.   On: 08/27/2022 11:40   DG Pelvis 1-2 Views  Result Date: 08/27/2022 CLINICAL DATA:  Fall after trying to get out of a chair after breakfast this morning. Left hip pain. EXAM: PELVIS - 1-2 VIEW COMPARISON:  None Available. FINDINGS: Non comminuted, mildly displaced fracture of the left femoral neck, midcervical to subcapital, distal component displaced superiorly by approximately 1.2 cm. Varus angulation. No other fractures. No bone lesions. Right hip total arthroplasty appears well seated and aligned. SI joints, pubic symphysis and left hip joint are normally spaced and aligned. Skeletal structures are diffusely demineralized. IMPRESSION: 1. Mildly displaced, varus angulated, non comminuted fracture  of the left femoral neck. No dislocation. Electronically Signed   By: Lajean Manes M.D.   On: 08/27/2022 10:18   DG Chest 1 View  Result Date: 08/27/2022 CLINICAL DATA:  Fall after trying to get out of a chair after breakfast this morning. Left hand pain. Short of breath. EXAM: CHEST  1 VIEW COMPARISON:  02/17/2020. FINDINGS: Mild to moderate enlargement of the cardiopericardial silhouette. No mediastinal or hilar masses. Bilateral interstitial thickening similar to the prior exam. Right lung base opacity suspected to be a combination of atelectasis and a probable small effusion. Remainder of the lungs is clear. No pneumothorax. Skeletal structures are demineralized, grossly intact. IMPRESSION: 1. No acute cardiopulmonary disease. Electronically Signed   By: Lajean Manes M.D.   On: 08/27/2022 10:16   DG Hand 2 View Left  Result Date: 08/27/2022 CLINICAL DATA:  Fall after trying to get out of a chair after breakfast this morning. Left hand pain. EXAM: LEFT HAND - 2 VIEW COMPARISON:  12/01/2021. FINDINGS: No fracture. Joints are normally aligned. There is joint space narrowing, subchondral sclerosis and marginal osteophytes involving multiple interphalangeal  joints as well as the radial carpal and first carpal metacarpal articulations consistent with osteoarthritis. Skeletal structures are diffusely demineralized. Soft tissues are unremarkable. IMPRESSION: No fracture or dislocation. Electronically Signed   By: Lajean Manes M.D.   On: 08/27/2022 10:15   CT HEAD WO CONTRAST (5MM)  Addendum Date: 08/27/2022   ADDENDUM REPORT: 08/27/2022 10:02 ADDENDUM: Study discussed by telephone with Dr. Tretha Sciara on 08/27/2022 at 1001 hours. Electronically Signed   By: Genevie Ann M.D.   On: 08/27/2022 10:02   Result Date: 08/27/2022 CLINICAL DATA:  88 year old female status post fall from chair. EXAM: CT HEAD WITHOUT CONTRAST TECHNIQUE: Contiguous axial images were obtained from the base of the skull through the vertex without intravenous contrast. RADIATION DOSE REDUCTION: This exam was performed according to the departmental dose-optimization program which includes automated exposure control, adjustment of the mA and/or kV according to patient size and/or use of iterative reconstruction technique. COMPARISON:  Report of [facility] head CT 12/01/2021 (no images available). FINDINGS: Brain: Age associated cerebral volume loss and some ex vacuo ventricular enlargement. Suspicion of subtle 1-2 mm left side subdural hematoma is hypodense (series 4, image 35) and associated with similar trace rightward midline shift. But no other intracranial hemorrhage is identified. Basilar cisterns remain normal. Patchy and confluent bilateral cerebral white matter hypodensity. Mild heterogeneity in the deep gray matter nuclei. No cortically based acute infarct identified. Vascular: Calcified atherosclerosis at the skull base. Dominant distal left vertebral artery suspected. Skull: No acute osseous abnormality identified. Bone mineralization is within normal limits for age. Sinuses/Orbits: Visualized paranasal sinuses and mastoids are clear. Other: Postoperative changes to both  globes. No acute orbit or scalp soft tissue injury identified. IMPRESSION: 1. Evidence of a trace 1-2 mm left side subdural hematoma, with associated trace rightward midline shift. 2. But no other intracranial hemorrhage, acute traumatic injury identified, or acute intracranial abnormality identified. Electronically Signed: By: Genevie Ann M.D. On: 08/27/2022 09:58   CT CERVICAL SPINE WO CONTRAST  Result Date: 08/27/2022 CLINICAL DATA:  88 year old female status post fall from chair. EXAM: CT CERVICAL SPINE WITHOUT CONTRAST TECHNIQUE: Multidetector CT imaging of the cervical spine was performed without intravenous contrast. Multiplanar CT image reconstructions were also generated. RADIATION DOSE REDUCTION: This exam was performed according to the departmental dose-optimization program which includes automated exposure control, adjustment of the mA  and/or kV according to patient size and/or use of iterative reconstruction technique. COMPARISON:  Head CT today. Report of CT cervical spine 12/01/2021 [facility] (no images available). FINDINGS: Alignment: Cervicothoracic junction alignment is within normal limits. Degenerative appearing anterolisthesis C4-C5 (ankylosis of that level) and C5-C6. Bilateral posterior element alignment is within normal limits. Skull base and vertebrae: Visualized skull base is intact. No atlanto-occipital dissociation. C1 and C2 appear intact and aligned. Some osteopenia in the cervical spine. No acute osseous abnormality identified. Soft tissues and spinal canal: No prevertebral fluid or swelling. No visible canal hematoma. Negative visible noncontrast neck soft tissues for age; calcified carotid atherosclerosis. Disc levels: Cervical spine degeneration superimposed on C4-C5 ankylosis. But capacious spinal canal. No CT evidence of spinal stenosis. Upper chest: Visible upper thoracic levels appear grossly intact. Lung apices are clear. IMPRESSION: 1. No acute traumatic injury  identified in the cervical spine. 2. Degenerative appearing C4-C5 ankylosis.  Capacious spinal canal. Electronically Signed   By: Genevie Ann M.D.   On: 08/27/2022 10:02    EKG: Independently reviewed.   Assessment/Plan Principal Problem:   Left displaced femoral neck fracture (HCC) Active Problems:   Subdural hematoma (HCC)   Surgical or procedure not carried out because of patient's decision   Arthritis   Essential hypertension, benign   COPD (chronic obstructive pulmonary disease) (HCC)   S/P right THA, AA   Expected blood loss anemia   Hyperlipidemia   Mitral valve disease   Colon cancer (HCC)   Osteoporosis, post-menopausal   Thoracic aortic atherosclerosis (HCC)   Hypothyroidism   Lactic acidosis   Hypokalemia   Obstipation   Hyperglycemia   DNR (do not resuscitate)   Hip pain   At risk for falling   Assessment and Plan: * Left displaced femoral neck fracture ([place]) - Discussed with orthopedics, high risk fracture and patient is a poor operative candidate and family is against surgical repair and elected to conservative management -Ortho recommends nonweightbearing 4 to 6 weeks - Patient will need alternative living arrangements given that patient's daughter cannot manage or care for her in this condition - Given high mortality associated with his fracture, consult palliative medicine  Surgical or procedure not carried out because of patient's decision - Conservative management requested by family  Subdural hematoma ([place]) - Likely hit head with fall, trace amount of hemorrhage noted, planning to recheck CT head without contrast in a.m. to follow-up, family against any invasive procedures or surgical management at this time  At risk for falling - Given dementia, fall precautions recommended, patient should be on bedrest now to ambulate only with assistance  Hip pain - Oral and IV pain management  DNR (do not resuscitate) - Confirmed with daughter at bedside, given  high mortality associated with untreated hip fracture, palliative consultation requested  Hyperglycemia - Possibly a stress response, check an A1c and follow  Obstipation - Large amount of stool noted in the colon, ordered a milk of molasses enema and oral laxatives  Hypokalemia - IV replacement ordered, recheck in morning  Lactic acidosis - Likely secondary to dehydration, hydrating with IV fluids, recheck lactate  Hypothyroidism - Stable on levothyroxine  Thoracic aortic atherosclerosis ([place]) - Stable  Osteoporosis, post-menopausal - Managed with calcium and vitamin D supplementation  Mitral valve disease - Stable  Hyperlipidemia - Atorvastatin daily reordered from home doses  Expected blood loss anemia - Recheck CBC in a.m., conservative management only per family request  S/P right THA, AA - Daughter reports  patient had a difficult time recovering from this surgery 10 years ago and against going forward with surgery for the acute fracture  COPD (chronic obstructive pulmonary disease) ([place]) - Home meds bronchodilators as needed  Essential hypertension, benign - Home meds  Arthritis - Pain management ordered palliative consult  DVT prophylaxis: TED  hoses   Code Status: DNR   Family Communication: daughter   Disposition Plan: TBD   Consults called: palliative care, orthopedics (phone)  Admission status: INP  Level of care: Med-Surg Irwin Brakeman MD [facility] How to contact the [facility] Attending or Consulting provider [place] or covering provider during after hours [place], for this patient?  Check the care team in [facility] and look for a) attending/consulting [facility] provider listed and b) the [facility] team listed Log into [email or web address] and use [place]'s universal password to access. If you do not have the password, please contact the hospital operator. Locate the [facility] provider you are looking for under [facility] and page to a number that you can be  directly reached. If you still have difficulty reaching the provider, please page the [employer] (Director on Call) for the Hospitalists listed on amion for assistance.   If 7PM-7AM, please contact night-coverage [email or web address] Password [email or web address]  08/27/2022, 3:25 PM

## 2022-08-27 NOTE — Assessment & Plan Note (Signed)
-   Possibly a stress response, check an A1c and follow

## 2022-08-27 NOTE — Assessment & Plan Note (Signed)
-   Confirmed with daughter at bedside, given high mortality associated with untreated hip fracture, palliative consultation requested

## 2022-08-27 NOTE — Assessment & Plan Note (Signed)
-   Conservative management requested by family

## 2022-08-27 NOTE — Assessment & Plan Note (Signed)
Stable

## 2022-08-27 NOTE — Assessment & Plan Note (Signed)
-   conservative management only per family request

## 2022-08-27 NOTE — ED Notes (Signed)
DNR armband placed on patient

## 2022-08-27 NOTE — Assessment & Plan Note (Addendum)
-   Likely hit head with fall, trace amount of hemorrhage noted, planning to recheck CT head without contrast to follow-up, family against any invasive procedures or surgical management at this time -- repeated CT head on 10/30 did not show any findings of hemorrhage

## 2022-08-27 NOTE — ED Notes (Signed)
Attempted report 

## 2022-08-27 NOTE — Assessment & Plan Note (Signed)
-   Oral and IV pain management

## 2022-08-27 NOTE — Discharge Instructions (Addendum)
1)Encourage adequate oral intake---Please consider dietitian consult 2) Ensure or Boost nutritional supplements recommended 3)Please consider transition to Palliative care/Hospice consult if patient does not improve

## 2022-08-27 NOTE — ED Notes (Signed)
AC notified of medication scheduled to be given

## 2022-08-27 NOTE — ED Notes (Signed)
Received molasses from main pharmacy

## 2022-08-27 NOTE — Assessment & Plan Note (Signed)
-   Home meds bronchodilators as needed

## 2022-08-27 NOTE — ED Notes (Signed)
Patient restless in bed, EDP notified of elevated heart rate and lactic acid

## 2022-08-27 NOTE — ED Notes (Signed)
Oxygen started at 2l /min

## 2022-08-27 NOTE — ED Notes (Signed)
Report given to floor nurse

## 2022-08-28 ENCOUNTER — Encounter (HOSPITAL_COMMUNITY): Payer: Self-pay | Admitting: Family Medicine

## 2022-08-28 ENCOUNTER — Inpatient Hospital Stay (HOSPITAL_COMMUNITY): Payer: Medicare Other

## 2022-08-28 DIAGNOSIS — Z515 Encounter for palliative care: Secondary | ICD-10-CM | POA: Diagnosis not present

## 2022-08-28 DIAGNOSIS — Z66 Do not resuscitate: Secondary | ICD-10-CM | POA: Diagnosis not present

## 2022-08-28 DIAGNOSIS — R739 Hyperglycemia, unspecified: Secondary | ICD-10-CM

## 2022-08-28 DIAGNOSIS — E782 Mixed hyperlipidemia: Secondary | ICD-10-CM

## 2022-08-28 DIAGNOSIS — I059 Rheumatic mitral valve disease, unspecified: Secondary | ICD-10-CM

## 2022-08-28 DIAGNOSIS — D500 Iron deficiency anemia secondary to blood loss (chronic): Secondary | ICD-10-CM | POA: Diagnosis not present

## 2022-08-28 DIAGNOSIS — Z9181 History of falling: Secondary | ICD-10-CM | POA: Diagnosis not present

## 2022-08-28 DIAGNOSIS — E039 Hypothyroidism, unspecified: Secondary | ICD-10-CM

## 2022-08-28 DIAGNOSIS — Z7189 Other specified counseling: Secondary | ICD-10-CM | POA: Diagnosis not present

## 2022-08-28 DIAGNOSIS — S72002A Fracture of unspecified part of neck of left femur, initial encounter for closed fracture: Secondary | ICD-10-CM | POA: Diagnosis not present

## 2022-08-28 LAB — CBC
HCT: 31.4 % — ABNORMAL LOW (ref 36.0–46.0)
Hemoglobin: 9.9 g/dL — ABNORMAL LOW (ref 12.0–15.0)
MCH: 27.7 pg (ref 26.0–34.0)
MCHC: 31.5 g/dL (ref 30.0–36.0)
MCV: 87.7 fL (ref 80.0–100.0)
Platelets: 213 10*3/uL (ref 150–400)
RBC: 3.58 MIL/uL — ABNORMAL LOW (ref 3.87–5.11)
RDW: 16.8 % — ABNORMAL HIGH (ref 11.5–15.5)
WBC: 11.7 10*3/uL — ABNORMAL HIGH (ref 4.0–10.5)
nRBC: 0 % (ref 0.0–0.2)

## 2022-08-28 LAB — BASIC METABOLIC PANEL
Anion gap: 10 (ref 5–15)
BUN: 20 mg/dL (ref 8–23)
CO2: 25 mmol/L (ref 22–32)
Calcium: 9.1 mg/dL (ref 8.9–10.3)
Chloride: 99 mmol/L (ref 98–111)
Creatinine, Ser: 0.81 mg/dL (ref 0.44–1.00)
GFR, Estimated: >60 mL/min (ref >60)
Glucose, Bld: 109 mg/dL — ABNORMAL HIGH (ref 70–99)
Potassium: 3.6 mmol/L (ref 3.5–5.1)
Sodium: 134 mmol/L — ABNORMAL LOW (ref 135–145)

## 2022-08-28 LAB — MAGNESIUM: Magnesium: 1.7 mg/dL (ref 1.7–2.4)

## 2022-08-28 MED ORDER — LACTATED RINGERS IV SOLN: INTRAVENOUS | Status: DC

## 2022-08-28 MED ORDER — LORAZEPAM 2 MG/ML IJ SOLN
1.0000 mg | Freq: Once | INTRAMUSCULAR | Status: AC
  Administered 2022-08-28: 1 mg via INTRAVENOUS
  Filled 2022-08-28: qty 1

## 2022-08-28 MED ORDER — QUETIAPINE FUMARATE 25 MG PO TABS
12.5000 mg | ORAL_TABLET | Freq: Every day | ORAL | Status: DC
  Administered 2022-08-29 (×2): 12.5 mg via ORAL
  Filled 2022-08-28 (×2): qty 1

## 2022-08-28 NOTE — Evaluation (Signed)
Physical Therapy Evaluation Patient Details Name: Brianna [name] MRN: [id number] DOB: 1925-10-06 Today's Date: 08/28/2022  History of Present Illness  Brianna [name] is a 88 year old female living with her daughter with dementia and very hard of hearing has not been acting her self for the last 3 to 4 days.  She has been wandering at home confused with very poor oral intake.  Patient was going to check the weather outside this morning and ended up falling on the ground.  She fell onto her left side.  She has been crying out and confused since the fall.  Unsure if she hit her head or not.  She complains of left-sided hip pain and groin pain.  X-rays confirm an acute displaced left femoral neck fracture.  Head CT also reported a tiny subdural hematoma.  She was noted to be dehydrated.  There was a large amount of stool in the colon.  She was hypokalemic and had a lactic acid of 5.0.  Orthopedics Dr. Amedeo Kinsman was consulted who reported that this is a high risk fracture and patient is not a candidate for repair.  Family does not want to proceed with surgical repair given her core morbidities and that her last hip repair was a very difficult recovery process for her several years ago.  They agreed to making her DNR and to prefer the conservative management.  Her daughter can no longer care for her at home.  Her daughter has Parkinson's disease and unable to manage her in this condition.  Orthopedics recommended that patient be nonweightbearing for the next 4 to 6 weeks.  Patient is being admitted for further management.   Clinical Impression  Patient demonstrates slow labored movement for sitting up at bedside with c/o severe pain left hip with movement, poor sitting balance due to increasing pain left hip and unable to attempt sit to stands or transferring to chair.  Patient required Max assist to reposition when put back to bed.  Patient will benefit from continued skilled physical therapy in hospital and  recommended venue below to increase strength, balance, endurance for safe ADLs and gait.         Recommendations for follow up therapy are one component of a multi-disciplinary discharge planning process, led by the attending physician.  Recommendations may be updated based on patient status, additional functional criteria and insurance authorization.  Follow Up Recommendations Skilled nursing-short term rehab (<3 hours/day) Can patient physically be transported by private vehicle: No    Assistance Recommended at Discharge    Patient can return home with the following  A lot of help with bathing/dressing/bathroom;A lot of help with walking and/or transfers;Help with stairs or ramp for entrance;Assistance with cooking/housework    Equipment Recommendations Rolling walker (2 wheels)  Recommendations for Other Services       Functional Status Assessment Patient has had a recent decline in their functional status and demonstrates the ability to make significant improvements in function in a reasonable and predictable amount of time.     Precautions / Restrictions Precautions Precautions: Fall Restrictions Weight Bearing Restrictions: Yes LLE Weight Bearing: Non weight bearing      Mobility  Bed Mobility Overal bed mobility: Needs Assistance Bed Mobility: Supine to Sit, Sit to Supine     Supine to sit: Max assist Sit to supine: Max assist   General bed mobility comments: slow labored movement with c/o increased pain left hip with movement    Transfers  Ambulation/Gait                  Stairs            Wheelchair Mobility    Modified Rankin (Stroke Patients Only)       Balance Overall balance assessment: Needs assistance Sitting-balance support: Feet supported, No upper extremity supported Sitting balance-Leahy Scale: Poor Sitting balance - Comments: fair/poor seated at EOB                                      Pertinent Vitals/Pain Pain Assessment Pain Assessment: Faces Faces Pain Scale: Hurts even more Pain Location: left hip with movement    Home Living Family/patient expects to be discharged to:: Private residence Living Arrangements: Children Available Help at Discharge: Family;Available PRN/intermittently               Additional Comments: Patient is a poor historian    Prior Function Prior Level of Function : Needs assist       Physical Assist : Mobility (physical);ADLs (physical) Mobility (physical): Bed mobility;Transfers;Gait;Stairs   Mobility Comments: household ambulator per chart ADLs Comments: assisted by family     Hand Dominance        Extremity/Trunk Assessment   Upper Extremity Assessment Upper Extremity Assessment: Generalized weakness    Lower Extremity Assessment Lower Extremity Assessment: Generalized weakness;LLE deficits/detail LLE Deficits / Details: grossly 2/5 LLE: Unable to fully assess due to pain LLE Sensation: WNL LLE Coordination: WNL    Cervical / Trunk Assessment Cervical / Trunk Assessment: Kyphotic  Communication   Communication: Other (comment) (patient lethargic)  Cognition Arousal/Alertness: Awake/alert, Lethargic Behavior During Therapy: Flat affect Overall Cognitive Status: History of cognitive impairments - at baseline                                          General Comments      Exercises     Assessment/Plan    PT Assessment Patient needs continued PT services  PT Problem List Decreased strength;Decreased activity tolerance;Decreased balance;Decreased mobility       PT Treatment Interventions DME instruction;Gait training;Stair training;Functional mobility training;Therapeutic activities;Therapeutic exercise;Balance training;Patient/family education    PT Goals (Current goals can be found in the Care Plan section)  Acute Rehab PT Goals Patient Stated Goal: not stated PT Goal  Formulation: With patient Time For Goal Achievement: 09/11/22 Potential to Achieve Goals: Fair    Frequency Min 3X/week     Co-evaluation               AM-PAC PT "6 Clicks" Mobility  Outcome Measure Help needed turning from your back to your side while in a flat bed without using bedrails?: A Lot Help needed moving from lying on your back to sitting on the side of a flat bed without using bedrails?: A Lot Help needed moving to and from a bed to a chair (including a wheelchair)?: Total Help needed standing up from a chair using your arms (e.g., wheelchair or bedside chair)?: Total Help needed to walk in hospital room?: Total Help needed climbing 3-5 steps with a railing? : Total 6 Click Score: 8    End of Session   Activity Tolerance: Patient limited by lethargy;Patient limited by pain;Patient limited by fatigue Patient left: in bed;with call bell/phone within reach;with bed  alarm set Nurse Communication: Mobility status PT Visit Diagnosis: Unsteadiness on feet (R26.81);Other abnormalities of gait and mobility (R26.89);Muscle weakness (generalized) (M62.81)    Time: 9728-2060 PT Time Calculation (min) (ACUTE ONLY): 20 min   Charges:   PT Evaluation $PT Eval Moderate Complexity: 1 Mod PT Treatments $Therapeutic Activity: 8-22 mins        12:19 PM, 08/28/22 Lonell Grandchild, MPT Physical Therapist with [facility] [phone] office [phone] mobile phone

## 2022-08-28 NOTE — Progress Notes (Signed)
Pt is agitated and pulling at devices. Radiology tech called to see if pt was able to come down. On call messaged for possible sedation for the follow up scan to be done. Awaiting order.

## 2022-08-28 NOTE — Progress Notes (Signed)
PROGRESS NOTE   Brianna [name]  OIB:[id number] DOB: 1925-02-11 DOA: 08/27/2022 PCP: Claretta Fraise, MD   Chief Complaint  Patient presents with   Fall   Level of care: Med-Surg  Brief Admission History:  88 year old female living with her daughter with dementia and very hard of hearing has not been acting her self for the last 3 to 4 days.  She has been wandering at home confused with very poor oral intake.  Patient was going to check the weather outside this morning and ended up falling on the ground.  She fell onto her left side.  She has been crying out and confused since the fall.  Unsure if she hit her head or not.  She complains of left-sided hip pain and groin pain.  X-rays confirm an acute displaced left femoral neck fracture.  Head CT also reported a tiny subdural hematoma.  She was noted to be dehydrated.  There was a large amount of stool in the colon.  She was hypokalemic and had a lactic acid of 5.0.  Orthopedics Dr. Amedeo Kinsman was consulted who reported that this is a high risk fracture and patient is not a candidate for repair.  Family does not want to proceed with surgical repair given her core morbidities and that her last hip repair was a very difficult recovery process for her several years ago.  They agreed to making her DNR and to prefer the conservative management.  Her daughter can no longer care for her at home.  Her daughter has Parkinson's disease and unable to manage her in this condition.  Orthopedics recommended that patient be nonweightbearing for the next 4 to 6 weeks.  Patient is being admitted for further management.   Assessment and Plan: * Left displaced femoral neck fracture ([place]) - Discussed with orthopedics, high risk fracture and patient is a poor operative candidate and family is against surgical repair and elected to conservative management -Ortho recommends nonweightbearing 4 to 6 weeks - Patient will need alternative living arrangements given that patient's  daughter cannot manage or care for her in this condition - Given high mortality associated with this fracture, consult palliative medicine  Surgical or procedure not carried out because of patient's decision - Conservative management requested by family  Subdural hematoma ([place]) - Likely hit head with fall, trace amount of hemorrhage noted, planning to recheck CT head without contrast to follow-up, family against any invasive procedures or surgical management at this time -- repeated CT head on 10/30 did not show any findings of hemorrhage  At risk for falling - Given dementia, fall precautions recommended, patient should be on bedrest now to ambulate only with assistance with nonweightbearing to LLE   Hip pain - Oral and IV pain management  DNR (do not resuscitate) - Confirmed with daughter at bedside, given high mortality associated with untreated hip fracture, palliative consultation requested  Hyperglycemia - Possibly a stress response, check an A1c and follow  Obstipation - Large amount of stool noted in the colon, ordered a milk of molasses enema and oral laxatives  Hypokalemia - IV replacement ordered, recheck in morning  Lactic acidosis - Likely secondary to dehydration, hydrating with IV fluids, recheck lactate  Hypothyroidism - Stable on levothyroxine  Thoracic aortic atherosclerosis ([place]) - Stable  Osteoporosis, post-menopausal - Managed with calcium and vitamin D supplementation  Mitral valve disease - Stable  Hyperlipidemia - Atorvastatin daily reordered from home doses  Expected blood loss anemia - Recheck CBC in a.m., conservative management  only per family request  S/P right THA, AA - Daughter reports patient had a difficult time recovering from this surgery 10 years ago and against going forward with surgery for the acute fracture  COPD (chronic obstructive pulmonary disease) ([place]) - Home meds bronchodilators as needed  Essential hypertension,  benign - Home meds  Arthritis - Pain management ordered palliative consult   DVT prophylaxis: TED hose Code Status: DNR  Family Communication: daughter 10/29 Disposition: Status is: Inpatient Remains inpatient appropriate because: intensity of needs   Consultants:  Orthopedics (telephone) Palliative medicine  Procedures:   Antimicrobials:    Subjective: Pt somnolent from being sedated for CT scan earlier  Objective: Vitals:   08/27/22 2328 08/27/22 2330 08/28/22 0033 08/28/22 0418  BP: 108/66 125/70 (!) 183/119 (!) 172/100  Pulse: 81 78 77 91  Resp: (!) 21 20 (!) 22 20  Temp: 97.7 F (36.5 C)  97.7 F (36.5 C) 97.9 F (36.6 C)  TempSrc: Axillary     SpO2: 100% 100% 95% 100%  Weight:      Height:        Intake/Output Summary (Last 24 hours) at 08/28/2022 1356 Last data filed at 08/28/2022 1150 Gross per 24 hour  Intake 2431.08 ml  Output 200 ml  Net 2231.08 ml   Filed Weights   08/27/22 0906  Weight: 43.2 kg   Examination:  General exam: frail emaciated female, sedated, hard of hearing, with dementia.   Respiratory system: no increased work of breathing. Cardiovascular system: normal S1 & S2 heard. No JVD, murmurs, rubs, gallops or clicks. No pedal edema. Gastrointestinal system: Abdomen is nondistended, soft and nontender. No organomegaly or masses felt. Normal bowel sounds heard. Central nervous system: Alert and oriented. No focal neurological deficits. Extremities: left leg rotated out shortened.   Skin: No rashes, lesions or ulcers. Psychiatry: Judgement and insight appear poor. Mood & affect unable to determine.    Data Reviewed: I have personally reviewed following labs and imaging studies  CBC: Recent Labs  Lab 08/27/22 0914 08/28/22 0442  WBC 6.6 11.7*  NEUTROABS 2.9  --   HGB 10.9* 9.9*  HCT 35.0* 31.4*  MCV 90.0 87.7  PLT 232 557    Basic Metabolic Panel: Recent Labs  Lab 08/27/22 0914 08/28/22 0442  NA 135 134*  K 3.0* 3.6   CL 101 99  CO2 17* 25  GLUCOSE 222* 109*  BUN 24* 20  CREATININE 1.02* 0.81  CALCIUM 8.9 9.1  MG  --  1.7    CBG: No results for input(s): "GLUCAP" in the last 168 hours.  Recent Results (from the past 240 hour(s))  Blood culture (routine x 2)     Status: None (Preliminary result)   Collection Time: 08/27/22  9:23 AM   Specimen: BLOOD RIGHT FOREARM  Result Value Ref Range Status   Specimen Description   Final    BLOOD RIGHT FOREARM BOTTLES DRAWN AEROBIC AND ANAEROBIC   Special Requests Blood Culture adequate volume  Final   Culture   Final    NO GROWTH < 12 HOURS Performed at [facility], [address], [place], [place] [address]    Report Status PENDING  Incomplete  Blood culture (routine x 2)     Status: None (Preliminary result)   Collection Time: 08/27/22  2:40 PM   Specimen: BLOOD LEFT FOREARM  Result Value Ref Range Status   Specimen Description   Final    BLOOD LEFT FOREARM BOTTLES DRAWN AEROBIC AND ANAEROBIC  Special Requests Blood Culture adequate volume  Final   Culture   Final    NO GROWTH < 12 HOURS Performed at [facility], [address], [place], [place] [address]    Report Status PENDING  Incomplete     Radiology Studies: CT HEAD WO CONTRAST (5MM)  Result Date: 08/28/2022 CLINICAL DATA:  88 year old female status post fall from chair with trace left side subdural hematoma yesterday. Left femoral neck fracture. EXAM: CT HEAD WITHOUT CONTRAST TECHNIQUE: Contiguous axial images were obtained from the base of the skull through the vertex without intravenous contrast. RADIATION DOSE REDUCTION: This exam was performed according to the departmental dose-optimization program which includes automated exposure control, adjustment of the mA and/or kV according to patient size and/or use of iterative reconstruction technique. COMPARISON:  CT head and cervical spine 08/27/2022. FINDINGS: Brain: Subtle left side low-density subdural collection is decreased in  conspicuity, virtually resolved by CT. No new or hyperdense intracranial blood products identified. Stable ex vacuo appearing ventricular enlargement. No significant intracranial mass effect. Basilar cisterns remain patent. Stable gray-white matter differentiation throughout the brain. No cortically based acute infarct identified. Vascular: Calcified atherosclerosis at the skull base. No suspicious intracranial vascular hyperdensity. Skull: Stable and intact. Sinuses/Orbits: Visualized paranasal sinuses and mastoids are stable and well aerated. Other: No acute orbit or scalp soft tissue injury identified. IMPRESSION: 1. The previously suspected trace left side subdural hematoma seems resolved by CT. 2. No skull fracture or acute intracranial abnormality. Electronically Signed   By: Genevie Ann M.D.   On: 08/28/2022 07:29   CT CHEST ABDOMEN PELVIS W CONTRAST  Result Date: 08/27/2022 CLINICAL DATA:  Sepsis. Elevated lactate. Patient fell from kitchen chair. EXAM: CT CHEST, ABDOMEN, AND PELVIS WITH CONTRAST TECHNIQUE: Multidetector CT imaging of the chest, abdomen and pelvis was performed following the standard protocol during bolus administration of intravenous contrast. RADIATION DOSE REDUCTION: This exam was performed according to the departmental dose-optimization program which includes automated exposure control, adjustment of the mA and/or kV according to patient size and/or use of iterative reconstruction technique. CONTRAST:  7m OMNIPAQUE IOHEXOL 300 MG/ML  SOLN COMPARISON:  None Available. FINDINGS: CT CHEST FINDINGS Cardiovascular: The heart is enlarged. Moderate atherosclerotic calcification is noted in the wall of the thoracic aorta. Enlargement of the pulmonary outflow tract/main pulmonary arteries suggests pulmonary arterial hypertension. Mediastinum/Nodes: No mediastinal lymphadenopathy. There is no hilar lymphadenopathy. The esophagus has normal imaging features. There is no axillary lymphadenopathy.  Lungs/Pleura: Interlobular septal thickening noted in the upper lobes bilaterally with atelectasis in the right middle lobe and both lower lobes. No suspicious pulmonary nodule or mass. No pleural effusion. Musculoskeletal: Multiple nonacute left-sided rib fractures evident nonacute fractures posterior tenth and eleventh ribs noted on the right. Multiple nonacute left rib fractures evident. CT ABDOMEN PELVIS FINDINGS Hepatobiliary: No suspicious focal abnormality within the liver parenchyma. Gallbladder is distended with layering tiny calcified stones in the fundal region. No intrahepatic or extrahepatic biliary dilation. Pancreas: Main pancreatic duct is diffusely prominent measuring up to 3 mm diameter. 4 mm hypodensity noted in the body of pancreas (62/2), likely cystic. Spleen: No splenomegaly. No focal mass lesion. Adrenals/Urinary Tract: No adrenal nodule or mass. 15 mm low-density lesion interpolar right kidney is likely a cyst. No followup imaging is recommended. Left kidney unremarkable. Mild fullness noted in the right ureter. Bladder is markedly distended. Stomach/Bowel: Stomach is moderately distended with gas and fluid. Wall thickening in the antral region may be related to peristalsis although infection/inflammation could have  this appearance. Duodenum is normally positioned as is the ligament of Treitz. No small bowel wall thickening. No small bowel dilatation. The terminal ileum is normal. The appendix is not well visualized, but there is no edema or inflammation in the region of the cecum. No gross colonic mass. No colonic wall thickening. Moderate to large stool volume noted. Vascular/Lymphatic: There is moderate atherosclerotic calcification of the abdominal aorta without aneurysm. There is no gastrohepatic or hepatoduodenal ligament lymphadenopathy. No retroperitoneal or mesenteric lymphadenopathy. No pelvic sidewall lymphadenopathy. Reproductive: There is no adnexal mass. Other: No  intraperitoneal free fluid. Musculoskeletal: Status post right hip replacement. Left femoral neck fracture with varus angulation evident. No worrisome lytic or sclerotic osseous abnormality. Thoracolumbar scoliosis evident. IMPRESSION: 1. Left femoral neck fracture with varus angulation. 2. Interlobular septal thickening in the upper lobes bilaterally suggesting component of edema. Atelectasis in the right middle lobe and both lower lobes. 3. Cardiomegaly. Enlargement of the pulmonary outflow tract/main pulmonary arteries suggests pulmonary arterial hypertension. 4. Cholelithiasis. 5. Wall thickening in the antral region may be related to peristalsis although infection/inflammation could have this appearance. 6. Moderate to large stool volume. Imaging features can be seen in the setting of clinical constipation. 7. 4 mm hypodensity in the body of the pancreas, likely a tiny cyst. Given patient age and lesion size, follow-up imaging in 2 years recommended to ensure stability. This recommendation follows ACR consensus guidelines: Management of Incidental Pancreatic Cysts: A White Paper of the ACR Incidental Findings Committee. J Am Coll Radiol 4132;44:010-272. 8.  Aortic Atherosclerosis (ICD10-I70.0). Electronically Signed   By: Misty Stanley M.D.   On: 08/27/2022 13:24   DG HIP UNILAT W OR W/O PELVIS 2-3 VIEWS LEFT  Result Date: 08/27/2022 CLINICAL DATA:  Fall. Left hip pain. Left hip fracture on current pelvis radiographs. EXAM: DG HIP (WITH OR WITHOUT PELVIS) 2-3V LEFT COMPARISON:  None Available. FINDINGS: Mildly displaced fracture of the mid left femoral neck, distal fracture component displacing superiorly by proximally 1.3 cm. Mild varus angulation. No other fracture.  Left hip joint is normally aligned. No bone lesion.  Skeletal structures are diffusely IMPRESSION: 1. Acute mildly displaced fracture of the left femoral neck with mild varus angulation. Electronically Signed   By: Lajean Manes M.D.   On:  08/27/2022 11:40   DG Pelvis 1-2 Views  Result Date: 08/27/2022 CLINICAL DATA:  Fall after trying to get out of a chair after breakfast this morning. Left hip pain. EXAM: PELVIS - 1-2 VIEW COMPARISON:  None Available. FINDINGS: Non comminuted, mildly displaced fracture of the left femoral neck, midcervical to subcapital, distal component displaced superiorly by approximately 1.2 cm. Varus angulation. No other fractures. No bone lesions. Right hip total arthroplasty appears well seated and aligned. SI joints, pubic symphysis and left hip joint are normally spaced and aligned. Skeletal structures are diffusely demineralized. IMPRESSION: 1. Mildly displaced, varus angulated, non comminuted fracture of the left femoral neck. No dislocation. Electronically Signed   By: Lajean Manes M.D.   On: 08/27/2022 10:18   DG Chest 1 View  Result Date: 08/27/2022 CLINICAL DATA:  Fall after trying to get out of a chair after breakfast this morning. Left hand pain. Short of breath. EXAM: CHEST  1 VIEW COMPARISON:  02/17/2020. FINDINGS: Mild to moderate enlargement of the cardiopericardial silhouette. No mediastinal or hilar masses. Bilateral interstitial thickening similar to the prior exam. Right lung base opacity suspected to be a combination of atelectasis and a probable small effusion. Remainder of the lungs  is clear. No pneumothorax. Skeletal structures are demineralized, grossly intact. IMPRESSION: 1. No acute cardiopulmonary disease. Electronically Signed   By: Lajean Manes M.D.   On: 08/27/2022 10:16   DG Hand 2 View Left  Result Date: 08/27/2022 CLINICAL DATA:  Fall after trying to get out of a chair after breakfast this morning. Left hand pain. EXAM: LEFT HAND - 2 VIEW COMPARISON:  12/01/2021. FINDINGS: No fracture. Joints are normally aligned. There is joint space narrowing, subchondral sclerosis and marginal osteophytes involving multiple interphalangeal joints as well as the radial carpal and first carpal  metacarpal articulations consistent with osteoarthritis. Skeletal structures are diffusely demineralized. Soft tissues are unremarkable. IMPRESSION: No fracture or dislocation. Electronically Signed   By: Lajean Manes M.D.   On: 08/27/2022 10:15   CT HEAD WO CONTRAST (5MM)  Addendum Date: 08/27/2022   ADDENDUM REPORT: 08/27/2022 10:02 ADDENDUM: Study discussed by telephone with Dr. Tretha Sciara on 08/27/2022 at 1001 hours. Electronically Signed   By: Genevie Ann M.D.   On: 08/27/2022 10:02   Result Date: 08/27/2022 CLINICAL DATA:  88 year old female status post fall from chair. EXAM: CT HEAD WITHOUT CONTRAST TECHNIQUE: Contiguous axial images were obtained from the base of the skull through the vertex without intravenous contrast. RADIATION DOSE REDUCTION: This exam was performed according to the departmental dose-optimization program which includes automated exposure control, adjustment of the mA and/or kV according to patient size and/or use of iterative reconstruction technique. COMPARISON:  Report of [facility] head CT 12/01/2021 (no images available). FINDINGS: Brain: Age associated cerebral volume loss and some ex vacuo ventricular enlargement. Suspicion of subtle 1-2 mm left side subdural hematoma is hypodense (series 4, image 35) and associated with similar trace rightward midline shift. But no other intracranial hemorrhage is identified. Basilar cisterns remain normal. Patchy and confluent bilateral cerebral white matter hypodensity. Mild heterogeneity in the deep gray matter nuclei. No cortically based acute infarct identified. Vascular: Calcified atherosclerosis at the skull base. Dominant distal left vertebral artery suspected. Skull: No acute osseous abnormality identified. Bone mineralization is within normal limits for age. Sinuses/Orbits: Visualized paranasal sinuses and mastoids are clear. Other: Postoperative changes to both globes. No acute orbit or scalp soft tissue injury  identified. IMPRESSION: 1. Evidence of a trace 1-2 mm left side subdural hematoma, with associated trace rightward midline shift. 2. But no other intracranial hemorrhage, acute traumatic injury identified, or acute intracranial abnormality identified. Electronically Signed: By: Genevie Ann M.D. On: 08/27/2022 09:58   CT CERVICAL SPINE WO CONTRAST  Result Date: 08/27/2022 CLINICAL DATA:  88 year old female status post fall from chair. EXAM: CT CERVICAL SPINE WITHOUT CONTRAST TECHNIQUE: Multidetector CT imaging of the cervical spine was performed without intravenous contrast. Multiplanar CT image reconstructions were also generated. RADIATION DOSE REDUCTION: This exam was performed according to the departmental dose-optimization program which includes automated exposure control, adjustment of the mA and/or kV according to patient size and/or use of iterative reconstruction technique. COMPARISON:  Head CT today. Report of CT cervical spine 12/01/2021 [facility] (no images available). FINDINGS: Alignment: Cervicothoracic junction alignment is within normal limits. Degenerative appearing anterolisthesis C4-C5 (ankylosis of that level) and C5-C6. Bilateral posterior element alignment is within normal limits. Skull base and vertebrae: Visualized skull base is intact. No atlanto-occipital dissociation. C1 and C2 appear intact and aligned. Some osteopenia in the cervical spine. No acute osseous abnormality identified. Soft tissues and spinal canal: No prevertebral fluid or swelling. No visible canal hematoma. Negative visible noncontrast  neck soft tissues for age; calcified carotid atherosclerosis. Disc levels: Cervical spine degeneration superimposed on C4-C5 ankylosis. But capacious spinal canal. No CT evidence of spinal stenosis. Upper chest: Visible upper thoracic levels appear grossly intact. Lung apices are clear. IMPRESSION: 1. No acute traumatic injury identified in the cervical spine. 2. Degenerative  appearing C4-C5 ankylosis.  Capacious spinal canal. Electronically Signed   By: Genevie Ann M.D.   On: 08/27/2022 10:02    Scheduled Meds:  acetaminophen  650 mg Oral Q6H   Or   acetaminophen  650 mg Rectal Q6H   QUEtiapine  12.5 mg Oral QHS   senna-docusate  1 tablet Oral BID   Continuous Infusions:  lactated ringers 50 mL/hr at 08/28/22 0342     LOS: 1 day   Time spent: 35 mins  Lennix Kneisel Wynetta Emery, MD How to contact the [facility] Attending or Consulting provider [place] or covering provider during after hours [place], for this patient?  Check the care team in [facility] and look for a) attending/consulting [facility] provider listed and b) the [facility] team listed Log into [email or web address] and use [place]'s universal password to access. If you do not have the password, please contact the hospital operator. Locate the [facility] provider you are looking for under [facility] and page to a number that you can be directly reached. If you still have difficulty reaching the provider, please page the [facility] (Director on Call) for the Hospitalists listed on amion for assistance.  08/28/2022, 1:56 PM

## 2022-08-28 NOTE — Consult Note (Signed)
Consultation Note Date: 08/28/2022   Patient Name: Brianna [name]  DOB: Mar 18, 1925  MRN: [id number]  Age / Sex: 88 y.o., female  PCP: Brianna Fraise, MD Referring Physician: Murlean Iba, MD  Reason for Consultation: Establishing goals of care  HPI/Patient Profile: 88 y.o. female  with past medical history of dementia, very hard of hearing, right hip fracture approximately 10 years ago, COPD, hypothyroid, HTN/HLD, rectosigmoid cancer 2004, ventral hernia, admitted on 08/27/2022 with left femoral neck fracture.   Clinical Assessment and Goals of Care: I have reviewed medical records including EPIC notes, labs and imaging, received report from RN, assessed the patient.  Brianna [name] is lying quietly in bed.  She has received Ativan this morning to facilitate her ability to participate with CT head.  She is resting comfortably, and does not interact in any meaningful way.  I do not believe that she can make her basic needs known.  Her son-in-law, Brianna [name], is present at bedside.  Brianna [name] shares that Brianna [name] daughter, Brianna [name], is at a doctor's appointment and will not be present until later in the afternoon.  Call to daughter, Brianna [name].  I introduced Palliative Medicine as specialized medical care for people living with serious illness. It focuses on providing relief from the symptoms and stress of a serious illness. The goal is to improve quality of life for both the patient and the family.  We talk about meeting for goals of care.  Brianna [name] is available for face-to-face meeting tomorrow at bedside 10/31 at 11 AM.   Discussed the importance of continued conversation with family and the medical providers regarding overall plan of care and treatment options, ensuring decisions are within the context of the patient's values and GOCs.  Questions and concerns were addressed. The family was encouraged to call  with questions or concerns.  PMT will continue to support holistically.  Conference with attending, bedside nursing staff, PT, transition of care team related to patient condition, needs, goals of care, disposition.   HCPOA NEXT OF KIN -daughter, Brianna [name]    SUMMARY OF RECOMMENDATIONS   Family meeting at bedside 10/30 1:11 AM   Code Status/Advance Care Planning: DNR  Symptom Management:  Per hospitalist, no additional needs at this time.  Palliative Prophylaxis:  Frequent Pain Assessment, Oral Care, and Turn Reposition  Additional Recommendations (Limitations, Scope, Preferences): Continue to treat the treatable but no CPR or intubation.  Time for outcomes  Psycho-social/Spiritual:  Desire for further Chaplaincy support:no Additional Recommendations: Caregiving  Support/Resources and Education on Hospice  Prognosis:  Unable to determine, based on outcomes.  Weeks would not be surprising based on advanced age, dementia, chronic illness burden, acute nonoperable hip fracture.  Discharge Planning: To be determined, based on outcomes and family's choice. Would qualify for residential hospice.       Primary Diagnoses: Present on Admission:  Left displaced femoral neck fracture (HCC)  Essential hypertension, benign  COPD (chronic obstructive pulmonary disease) (HCC)  Hyperlipidemia  Mitral valve disease  Hypothyroidism  Thoracic aortic  atherosclerosis (HCC)  Osteoporosis, post-menopausal  Colon cancer ([place])  Arthritis  Lactic acidosis  Hypokalemia  Subdural hematoma (HCC)  Obstipation  Hyperglycemia  Expected blood loss anemia  DNR (do not resuscitate)  Hip pain   I have reviewed the medical record, interviewed the patient and family, and examined the patient. The following aspects are pertinent.  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, 2004   Symptomatic menopausal or female climacteric states    Ventral hernia    Social History   Socioeconomic History   Marital status: Widowed    Spouse name: Not on file   Number of children: Not on file   Years of education: Not on file   Highest education level: Not on file  Occupational History   Not on file  Tobacco Use   Smoking status: Never   Smokeless tobacco: Never  Vaping Use   Vaping Use: Never used  Substance and Sexual Activity   Alcohol use: No    Alcohol/week: 0.0 standard drinks of alcohol   Drug use: No   Sexual activity: Not on file  Other Topics Concern   Not on file  Social History Narrative   Not on file   Social Determinants of Health   Financial Resource Strain: Not on file  Food Insecurity: Not on file  Transportation Needs: Not on file  Physical Activity: Not on file  Stress: Not on file  Social Connections: Not on file   Family History  Problem Relation Age of Onset   Heart disease Mother    Heart disease Father    Lung cancer Brother    Scheduled Meds:  acetaminophen  650 mg Oral Q6H   Or   acetaminophen  650 mg Rectal Q6H   QUEtiapine  12.5 mg Oral QHS   senna-docusate  1 tablet Oral BID   Continuous Infusions:  lactated ringers 50 mL/hr at 08/28/22 0342   PRN Meds:.fentaNYL (SUBLIMAZE) injection, haloperidol lactate, ondansetron **OR** ondansetron (ZOFRAN) IV, oxyCODONE Medications Prior to Admission:  Prior to Admission medications   Medication Sig Start Date End Date Taking? Authorizing Provider  amLODipine (NORVASC) 5 MG tablet Take 1 tablet  (5 mg total) by mouth daily. for blood pressure 05/11/22   Brianna Fraise, MD  atorvastatin (LIPITOR) 10 MG tablet Take 0.5 tablets (5 mg total) by mouth at bedtime. 05/11/22   Brianna Fraise, MD  beta carotene w/minerals (OCUVITE) tablet Take 1 tablet by mouth daily.    [provider]  Calcium Citrate-Vitamin D (CITRACAL + D PO) Take 2 tablets by mouth daily.     [provider]  Cholecalciferol (VITAMIN D3) 2000 UNITS TABS Take 1 tablet by mouth daily.    [provider]  diclofenac Sodium (VOLTAREN) 1 % GEL APPLY 4 GRAMS 4 TIMES DAILY 07/26/21   Brianna Fraise, MD  hydrochlorothiazide (MICROZIDE) 12.5 MG capsule TAKE ONE (1) CAPSULE EACH DAY  06/28/22   Brianna Fraise, MD  indomethacin (INDOCIN) 25 MG capsule Take 1 capsule (25 mg total) by mouth 2 (two) times daily as needed (gout pain and swelling). 05/21/22   Brianna Fraise, MD  levothyroxine (SYNTHROID) 25 MCG tablet TAKE 2 TABLETS DAILY MONDAY-FRIDAY & TAKE 1 TABLET DAILY SATURDAY & SUNDAY 05/11/22   Brianna Fraise, MD  losartan (COZAAR) 100 MG tablet TAKE ONE (1) TABLET EACH DAY 05/11/22   Brianna Fraise, MD  miconazole (MICATIN) 2 % cream Apply 1 application topically 2 (two) times daily. 12/30/21   Dettinger, Fransisca Kaufmann, MD  mometasone (ELOCON) 0.1 % cream Apply 1 application topically daily. To affected areas 11/10/21   Brianna Fraise, MD  Omega-3 Fatty Acids (FISH OIL PO) Take 1 tablet by mouth daily.    [provider]   Allergies  Allergen Reactions   Colchicine Diarrhea   Alendronate Sodium Nausea Only   Lodine [Etodolac]     unknown   Risedronate Sodium     unknown   Review of Systems  Unable to perform ROS: Dementia    Physical Exam Vitals and nursing note reviewed.     Vital Signs: BP (!) 172/100 (BP Location: Right Arm)   Pulse 91   Temp 97.9 F (36.6 C)   Resp 20   Ht 5' (1.524 m)   Wt 43.2 kg   SpO2 100%   BMI 18.60 kg/m  Pain Scale: Faces   Pain Score: 0-No pain   SpO2:  SpO2: 100 % O2 Device:SpO2: 100 % O2 Flow Rate: .O2 Flow Rate (L/min): 2 L/min  IO: Intake/output summary:  Intake/Output Summary (Last 24 hours) at 08/28/2022 1315 Last data filed at 08/28/2022 1150 Gross per 24 hour  Intake 2431.08 ml  Output 800 ml  Net 1631.08 ml    LBM: Last BM Date : 08/28/22 Baseline Weight: Weight: 43.2 kg Most recent weight: Weight: 43.2 kg     Palliative Assessment/Data:   Flowsheet Rows    Flowsheet Row Most Recent Value  Intake Tab   Referral Department Hospitalist  Unit at Time of Referral Med/Surg Unit  Palliative Care Primary Diagnosis Trauma  Date Notified 08/27/22  Palliative Care Type New Palliative care  Reason for referral Clarify Goals of Care  Date of Admission 08/27/22  Date first seen by Palliative Care 08/28/22  # of days Palliative referral response time 1 Day(s)  # of days IP prior to Palliative referral 0  Clinical Assessment   Palliative Performance Scale Score 20%  Pain Max last 24 hours Not able to report  Pain Min Last 24 hours Not able to report  Dyspnea Max Last 24 Hours Not able to report  Dyspnea Min Last 24 hours Not able to report  Psychosocial & Spiritual Assessment   Palliative Care Outcomes        Time In: 1230 Time Out: 1335 Time Total: 75 minutes  Greater than 50%  of this time was spent counseling and coordinating care related to the above assessment and plan.  Signed by: Drue Novel, NP   Please contact Palliative Medicine Team phone at [phone] for questions and concerns.  For individual provider: See Shea Evans

## 2022-08-28 NOTE — Plan of Care (Signed)
  Problem: Acute Rehab PT Goals(only PT should resolve) Goal: Pt Will Go Supine/Side To Sit Outcome: Progressing Flowsheets (Taken 08/28/2022 1220) Pt will go Supine/Side to Sit: with moderate assist Goal: Patient Will Transfer Sit To/From Stand Outcome: Progressing Flowsheets (Taken 08/28/2022 1220) Patient will transfer sit to/from stand:  with moderate assist  with maximum assist Goal: Pt Will Transfer Bed To Chair/Chair To Bed Outcome: Progressing Flowsheets (Taken 08/28/2022 1220) Pt will Transfer Bed to Chair/Chair to Bed:  with mod assist  with max assist Goal: Pt Will Ambulate Outcome: Progressing Flowsheets (Taken 08/28/2022 1220) Pt will Ambulate:  10 feet  with moderate assist  with rolling walker  with maximum assist   12:20 PM, 08/28/22 Lonell Grandchild, MPT Physical Therapist with [facility] [phone] office [phone] mobile phone

## 2022-08-29 DIAGNOSIS — Z7189 Other specified counseling: Secondary | ICD-10-CM | POA: Diagnosis not present

## 2022-08-29 DIAGNOSIS — Z9181 History of falling: Secondary | ICD-10-CM | POA: Diagnosis not present

## 2022-08-29 DIAGNOSIS — Z515 Encounter for palliative care: Secondary | ICD-10-CM | POA: Diagnosis not present

## 2022-08-29 DIAGNOSIS — Z66 Do not resuscitate: Secondary | ICD-10-CM | POA: Diagnosis not present

## 2022-08-29 DIAGNOSIS — D500 Iron deficiency anemia secondary to blood loss (chronic): Secondary | ICD-10-CM | POA: Diagnosis not present

## 2022-08-29 DIAGNOSIS — S72002A Fracture of unspecified part of neck of left femur, initial encounter for closed fracture: Secondary | ICD-10-CM | POA: Diagnosis not present

## 2022-08-29 MED ORDER — ORAL CARE MOUTH RINSE: 15.0000 mL | OROMUCOSAL | Status: DC | PRN

## 2022-08-29 NOTE — Progress Notes (Signed)
Patient very sleepy this shift, turned and repositioned as tolerated. Prn oxycodone this morning before bath , Speech therapy evaluated patient as she coughs at times when eating, no change to diet , crush meds in puree and follow with liquids. Palliative spoke with daughter today about patient condition and they are to continue follow up tomorrow . Patient is dependent for feeding, she ate very little today for breakfast and was not alert enough at lunch or dinner to attempt feeding. Family at bedside most of day , emotional support provided to them as this situation has not been easy for daughter. Call bell within reach , intentional rounding on patient. Hob at 30

## 2022-08-29 NOTE — Progress Notes (Addendum)
Palliative:   [name] is lying quietly in bed.  She appears acutely/chronically ill and quite frail, elderly.  She does not respond in any meaningful way to voice or touch.  Bedside nursing staff has administered 5 mg oxy approximately an hour and a half prior to my visit.  She clearly cannot make her basic needs known.  There is no family at bedside at this time.  Family meeting planned for 11 AM.  Detailed conference with bedside nursing staff.  Family meeting at bedside with daughter Brianna [name] and son-in-law Brianna [name].  [name] is lying quietly in bed.  She appears acutely ill.  She will briefly open her eyes, but not make eye contact.  After much prodding she is able to tell me her name but nothing more.  She denies pain at this time.  We talk in detail about [name].  We also discussed her health history and functional status.  Family shares that [name] had been independent, but endorsed that she cannot stay at home alone at night.  Brianna [name] shares that [name] had good memory, she is not aware of memory loss, although at times she describes what is clearly memory loss.  We talk in detail about options for care.  Including, but not limited to, home with hospice care (Brianna [name] states that they absolutely cannot care for her at home), short-term rehab (Brianna [name] states that she does not believe [name] can participate in rehab), private pay at SNF with hospice, and residential hospice for comfort and dignity at end-of-life, to let nature take its course.   We talk in detail about hospice care, what is and is not provided, both at home, in a facility, at residential hospice.  We talk about prognosis with permission.  I shared that a few weeks would not be surprising based on advanced age, acute hip fracture that is nonoperable, risk for skin breakdown, risk for pneumonia.    We talk about how to make choices for loved ones.  I share that Brianna [name] decides what this  time looks like and feels like for [name].  Brianna [name] shares that she feels that her mother would be angry if Brianna [name] chooses residential hospice.  Brianna [name] shares that friends and family are recommending residential hospice care.  I shared that this is the recommendation from the medical team, also.  Addendum: We also talk about IV fluids, how this can affect people near end-of-life.  We discussed CODE STATUS, DNR verified.  Conference with attending, bedside nursing staff, transition of care team related to patient condition, needs, goals of care, disposition.  Plan: At this point continue to treat the treatable but no CPR or intubation.  Brianna [name] request time to consider choices.  We are to have a phone call tomorrow around 1:30 PM for decision making.  43 minutes    Quinn Axe, NP Palliative Medicine Team  Team Phone [phone] Greater than 50% of this time was spent counseling and coordinating care related to the above assessment and plan.

## 2022-08-29 NOTE — Progress Notes (Signed)
PROGRESS NOTE   Brianna [name]  ULA:[id number] DOB: Jun 22, 1925 DOA: 08/27/2022 PCP: Claretta Fraise, MD   Chief Complaint  Patient presents with   Fall   Level of care: Med-Surg  Brief Admission History:  88 year old female living with her daughter with dementia and very hard of hearing has not been acting her self for the last 3 to 4 days.  She has been wandering at home confused with very poor oral intake.  Patient was going to check the weather outside this morning and ended up falling on the ground.  She fell onto her left side.  She has been crying out and confused since the fall.  Unsure if she hit her head or not.  She complains of left-sided hip pain and groin pain.  X-rays confirm an acute displaced left femoral neck fracture.  Head CT also reported a tiny subdural hematoma.  She was noted to be dehydrated.  There was a large amount of stool in the colon.  She was hypokalemic and had a lactic acid of 5.0.  Orthopedics Dr. Amedeo Kinsman was consulted who reported that this is a high risk fracture and patient is not a candidate for repair.  Family does not want to proceed with surgical repair given her core morbidities and that her last hip repair was a very difficult recovery process for her several years ago.  They agreed to making her DNR and to prefer the conservative management.  Her daughter can no longer care for her at home.  Her daughter has Parkinson's disease and unable to manage her in this condition.  Orthopedics recommended that patient be nonweightbearing for the next 4 to 6 weeks.  Patient is being admitted for further management.   Assessment and Plan: * Left displaced femoral neck fracture ([place]) - Discussed with orthopedics, high risk fracture and patient is a poor operative candidate and family is against surgical repair and elected to conservative management -Ortho recommends nonweightbearing 4 to 6 weeks - Patient will need alternative living arrangements given that patient's  daughter cannot manage or care for her in this condition - Given high mortality associated with this fracture, consult palliative medicine -- had a long talk with daughter on 10/30 and she is still undecided about hospice - -  Planning mtg with palliative care today  Surgical or procedure not carried out because of patient's decision - Conservative management ONLY requested by daughter   Subdural hematoma ([place]) - Likely hit head with fall, trace amount of hemorrhage noted, planning to recheck CT head without contrast to follow-up, family against any invasive procedures or surgical management at this time -- repeated CT head on 10/30 did not show any findings of hemorrhage (daughter notified of results on 10/30)   At risk for falling - Given dementia, fall precautions recommended, patient should be on bedrest now to ambulate only with assistance with nonweightbearing to LLE .  HIGH FALL RISK.   Hip pain - Oral and IV pain management  DNR (do not resuscitate) - Confirmed with daughter at bedside, given high mortality associated with untreated hip fracture, palliative consultation requested  Hyperglycemia - Possibly a stress response, check an A1c and follow  Obstipation - Large amount of stool noted in the colon, ordered a milk of molasses enema and oral laxatives  Hypokalemia - IV replacement ordered, recheck in morning  Lactic acidosis - Likely secondary to dehydration, hydrating with IV fluids, recheck lactate  Hypothyroidism - Stable on levothyroxine  Thoracic aortic atherosclerosis ([place]) -  Stable  Osteoporosis, post-menopausal - Managed with calcium and vitamin D supplementation  Mitral valve disease - Stable  Hyperlipidemia - Atorvastatin daily reordered from home doses  Expected blood loss anemia - conservative management only per family request  S/P right THA, AA - Daughter reports patient had a difficult time recovering from this surgery 10 years ago and  against going forward with surgery for the acute fracture  COPD (chronic obstructive pulmonary disease) ([place]) - Home meds bronchodilators as needed  Essential hypertension, benign - Home meds  Arthritis - Pain management ordered palliative consult   DVT prophylaxis: TED hose Code Status: DNR  Family Communication: daughter 10/29 Disposition: Status is: Inpatient Remains inpatient appropriate because: intensity of needs   Consultants:  Orthopedics (telephone) Palliative medicine  Procedures:   Antimicrobials:    Subjective: Pt barely responsive today, says she left hip pain with movement.  Very hard of hearing.   Objective: Vitals:   08/28/22 0418 08/28/22 1330 08/28/22 2020 08/29/22 0429  BP: (!) 172/100 (!) 168/79 (!) 164/74 108/60  Pulse: 91 91 95 81  Resp: '20 19 18 20  '$ Temp: 97.9 F (36.6 C) 98.1 F (36.7 C) 98.3 F (36.8 C) 97.9 F (36.6 C)  TempSrc:  Oral Axillary   SpO2: 100% 100% 92% 100%  Weight:      Height:        Intake/Output Summary (Last 24 hours) at 08/29/2022 1212 Last data filed at 08/29/2022 0500 Gross per 24 hour  Intake 617.67 ml  Output 700 ml  Net -82.33 ml   Filed Weights   08/27/22 0906  Weight: 43.2 kg   Examination:  General exam: frail emaciated female, sedated, hard of hearing, with dementia.   Respiratory system: no increased work of breathing. Cardiovascular system: normal S1 & S2 heard. No JVD, murmurs, rubs, gallops or clicks. No pedal edema. Gastrointestinal system: Abdomen is nondistended, soft and nontender. No organomegaly or masses felt. Normal bowel sounds heard. Central nervous system: Alert and oriented. No focal neurological deficits. Extremities: left leg rotated out shortened.   Skin: No rashes, lesions or ulcers. Psychiatry: Judgement and insight appear poor. Mood & affect unable to determine.    Data Reviewed: I have personally reviewed following labs and imaging studies  CBC: Recent Labs  Lab  08/27/22 0914 08/28/22 0442  WBC 6.6 11.7*  NEUTROABS 2.9  --   HGB 10.9* 9.9*  HCT 35.0* 31.4*  MCV 90.0 87.7  PLT 232 865    Basic Metabolic Panel: Recent Labs  Lab 08/27/22 0914 08/28/22 0442  NA 135 134*  K 3.0* 3.6  CL 101 99  CO2 17* 25  GLUCOSE 222* 109*  BUN 24* 20  CREATININE 1.02* 0.81  CALCIUM 8.9 9.1  MG  --  1.7    CBG: No results for input(s): "GLUCAP" in the last 168 hours.  Recent Results (from the past 240 hour(s))  Blood culture (routine x 2)     Status: None (Preliminary result)   Collection Time: 08/27/22  9:23 AM   Specimen: BLOOD RIGHT FOREARM  Result Value Ref Range Status   Specimen Description   Final    BLOOD RIGHT FOREARM BOTTLES DRAWN AEROBIC AND ANAEROBIC   Special Requests Blood Culture adequate volume  Final   Culture   Final    NO GROWTH 2 DAYS Performed at [facility], [address] [place], [place] [address]    Report Status PENDING  Incomplete  Blood culture (routine x 2)  Status: None (Preliminary result)   Collection Time: 08/27/22  2:40 PM   Specimen: BLOOD LEFT FOREARM  Result Value Ref Range Status   Specimen Description   Final    BLOOD LEFT FOREARM BOTTLES DRAWN AEROBIC AND ANAEROBIC   Special Requests Blood Culture adequate volume  Final   Culture   Final    NO GROWTH 2 DAYS Performed at [facility], [address] [place], [place] [address]    Report Status PENDING  Incomplete     Radiology Studies: CT HEAD WO CONTRAST (5MM)  Result Date: 08/28/2022 CLINICAL DATA:  88 year old female status post fall from chair with trace left side subdural hematoma yesterday. Left femoral neck fracture. EXAM: CT HEAD WITHOUT CONTRAST TECHNIQUE: Contiguous axial images were obtained from the base of the skull through the vertex without intravenous contrast. RADIATION DOSE REDUCTION: This exam was performed according to the departmental dose-optimization program which includes automated exposure control, adjustment of the mA  and/or kV according to patient size and/or use of iterative reconstruction technique. COMPARISON:  CT head and cervical spine 08/27/2022. FINDINGS: Brain: Subtle left side low-density subdural collection is decreased in conspicuity, virtually resolved by CT. No new or hyperdense intracranial blood products identified. Stable ex vacuo appearing ventricular enlargement. No significant intracranial mass effect. Basilar cisterns remain patent. Stable gray-white matter differentiation throughout the brain. No cortically based acute infarct identified. Vascular: Calcified atherosclerosis at the skull base. No suspicious intracranial vascular hyperdensity. Skull: Stable and intact. Sinuses/Orbits: Visualized paranasal sinuses and mastoids are stable and well aerated. Other: No acute orbit or scalp soft tissue injury identified. IMPRESSION: 1. The previously suspected trace left side subdural hematoma seems resolved by CT. 2. No skull fracture or acute intracranial abnormality. Electronically Signed   By: Genevie Ann M.D.   On: 08/28/2022 07:29   CT CHEST ABDOMEN PELVIS W CONTRAST  Result Date: 08/27/2022 CLINICAL DATA:  Sepsis. Elevated lactate. Patient fell from kitchen chair. EXAM: CT CHEST, ABDOMEN, AND PELVIS WITH CONTRAST TECHNIQUE: Multidetector CT imaging of the chest, abdomen and pelvis was performed following the standard protocol during bolus administration of intravenous contrast. RADIATION DOSE REDUCTION: This exam was performed according to the departmental dose-optimization program which includes automated exposure control, adjustment of the mA and/or kV according to patient size and/or use of iterative reconstruction technique. CONTRAST:  50m OMNIPAQUE IOHEXOL 300 MG/ML  SOLN COMPARISON:  None Available. FINDINGS: CT CHEST FINDINGS Cardiovascular: The heart is enlarged. Moderate atherosclerotic calcification is noted in the wall of the thoracic aorta. Enlargement of the pulmonary outflow tract/main  pulmonary arteries suggests pulmonary arterial hypertension. Mediastinum/Nodes: No mediastinal lymphadenopathy. There is no hilar lymphadenopathy. The esophagus has normal imaging features. There is no axillary lymphadenopathy. Lungs/Pleura: Interlobular septal thickening noted in the upper lobes bilaterally with atelectasis in the right middle lobe and both lower lobes. No suspicious pulmonary nodule or mass. No pleural effusion. Musculoskeletal: Multiple nonacute left-sided rib fractures evident nonacute fractures posterior tenth and eleventh ribs noted on the right. Multiple nonacute left rib fractures evident. CT ABDOMEN PELVIS FINDINGS Hepatobiliary: No suspicious focal abnormality within the liver parenchyma. Gallbladder is distended with layering tiny calcified stones in the fundal region. No intrahepatic or extrahepatic biliary dilation. Pancreas: Main pancreatic duct is diffusely prominent measuring up to 3 mm diameter. 4 mm hypodensity noted in the body of pancreas (62/2), likely cystic. Spleen: No splenomegaly. No focal mass lesion. Adrenals/Urinary Tract: No adrenal nodule or mass. 15 mm low-density lesion interpolar right kidney is likely a cyst.  No followup imaging is recommended. Left kidney unremarkable. Mild fullness noted in the right ureter. Bladder is markedly distended. Stomach/Bowel: Stomach is moderately distended with gas and fluid. Wall thickening in the antral region may be related to peristalsis although infection/inflammation could have this appearance. Duodenum is normally positioned as is the ligament of Treitz. No small bowel wall thickening. No small bowel dilatation. The terminal ileum is normal. The appendix is not well visualized, but there is no edema or inflammation in the region of the cecum. No gross colonic mass. No colonic wall thickening. Moderate to large stool volume noted. Vascular/Lymphatic: There is moderate atherosclerotic calcification of the abdominal aorta without  aneurysm. There is no gastrohepatic or hepatoduodenal ligament lymphadenopathy. No retroperitoneal or mesenteric lymphadenopathy. No pelvic sidewall lymphadenopathy. Reproductive: There is no adnexal mass. Other: No intraperitoneal free fluid. Musculoskeletal: Status post right hip replacement. Left femoral neck fracture with varus angulation evident. No worrisome lytic or sclerotic osseous abnormality. Thoracolumbar scoliosis evident. IMPRESSION: 1. Left femoral neck fracture with varus angulation. 2. Interlobular septal thickening in the upper lobes bilaterally suggesting component of edema. Atelectasis in the right middle lobe and both lower lobes. 3. Cardiomegaly. Enlargement of the pulmonary outflow tract/main pulmonary arteries suggests pulmonary arterial hypertension. 4. Cholelithiasis. 5. Wall thickening in the antral region may be related to peristalsis although infection/inflammation could have this appearance. 6. Moderate to large stool volume. Imaging features can be seen in the setting of clinical constipation. 7. 4 mm hypodensity in the body of the pancreas, likely a tiny cyst. Given patient age and lesion size, follow-up imaging in 2 years recommended to ensure stability. This recommendation follows ACR consensus guidelines: Management of Incidental Pancreatic Cysts: A White Paper of the ACR Incidental Findings Committee. J Am Coll Radiol 9622;29:798-921. 8.  Aortic Atherosclerosis (ICD10-I70.0). Electronically Signed   By: Misty Stanley M.D.   On: 08/27/2022 13:24    Scheduled Meds:  acetaminophen  650 mg Oral Q6H   Or   acetaminophen  650 mg Rectal Q6H   QUEtiapine  12.5 mg Oral QHS   senna-docusate  1 tablet Oral BID   Continuous Infusions:  lactated ringers 30 mL/hr at 08/29/22 0856     LOS: 2 days   Time spent: 35 mins  Talvin Christianson Wynetta Emery, MD How to contact the [facility] Attending or Consulting provider [place] or covering provider during after hours [place], for this patient?  Check  the care team in [facility] and look for a) attending/consulting [facility] provider listed and b) the [facility] team listed Log into [email or web address] and use [place]'s universal password to access. If you do not have the password, please contact the hospital operator. Locate the [facility] provider you are looking for under [facility] and page to a number that you can be directly reached. If you still have difficulty reaching the provider, please page the [facility] (Director on Call) for the Hospitalists listed on amion for assistance.  08/29/2022, 12:12 PM

## 2022-08-29 NOTE — Evaluation (Signed)
Clinical/Bedside Swallow Evaluation Patient Details  Name: Brianna [name] MRN: [id number] Date of Birth: 1924/12/07  Today's Date: 08/29/2022 Time: SLP Start Time (ACUTE ONLY): 10 SLP Stop Time (ACUTE ONLY): 1232 SLP Time Calculation (min) (ACUTE ONLY): 27 min  Past Medical History:  Past Medical History:  Diagnosis Date   Bilateral inguinal hernia ([facility])    Carpal tunnel syndrome on right    Colon polyp    COPD (chronic obstructive pulmonary disease) ([facility])    Essential hypertension    Fibrocystic breast    Frequency of urination    History of skin cancer    Hypothyroidism    Lumbosacral spondylosis without myelopathy    Mixed hyperlipidemia    Myxomatous mitral valve    Mild prolapse with eccentric, moderate to severe regurgitation   Obturator hernia, right    Osteoporosis    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, 2004   Symptomatic menopausal or female climacteric states    Ventral hernia    Past Surgical History:  Past Surgical History:  Procedure Laterality Date   APPENDECTOMY     CATARACT EXTRACTION W/ INTRAOCULAR LENS  IMPLANT, BILATERAL     CESAREAN SECTION     COLON SURGERY  2004   8'04-open LAR for rectosigmoid cancer T1N0 within polyp   INGUINAL HERNIA REPAIR  11/21/2011   Procedure: LAPAROSCOPIC BILATERAL INGUINAL HERNIA REPAIR;  Surgeon: Adin Hector, MD;  Location: [facility];  Service: General;  Laterality: N/A;   LAPAROSCOPY  11/21/2011   R obturator hernia repair   TONSILLECTOMY     TOTAL HIP ARTHROPLASTY Right 03/04/2013   Procedure: RIGHT TOTAL HIP ARTHROPLASTY ANTERIOR APPROACH;  Surgeon: Mauri Pole, MD;  Location: [facility];  Service: Orthopedics;  Laterality: Right;   VENTRAL HERNIA REPAIR  11/21/2011   Procedure: LAPAROSCOPIC VENTRAL HERNIA;  Surgeon: Adin Hector, MD;  Location: [facility];  Service: General;  Laterality: N/A;  laparoscopic bilateral inguinal hernia repair with mesh and ventral hernia repair with mesh   HPI:  88 year old female living  with her daughter with dementia and very hard of hearing has not been acting her self for the last 3 to 4 days.  She has been wandering at home confused with very poor oral intake.  Patient was going to check the weather outside this morning and ended up falling on the ground.  She fell onto her left side.  She has been crying out and confused since the fall.  Unsure if she hit her head or not.  She complains of left-sided hip pain and groin pain.  X-rays confirm an acute displaced left femoral neck fracture.  Head CT also reported a tiny subdural hematoma.  She was noted to be dehydrated.  There was a large amount of stool in the colon.  She was hypokalemic and had a lactic acid of 5.0.  Orthopedics Dr. Amedeo Kinsman was consulted who reported that this is a high risk fracture and patient is not a candidate for repair.  Family does not want to proceed with surgical repair given her core morbidities and that her last hip repair was a very difficult recovery process for her several years ago.  They agreed to making her DNR and to prefer the conservative management.  Her daughter can no longer care for her at home.  Her daughter has Parkinson's disease and unable to manage her in this condition.  Orthopedics recommended that patient be nonweightbearing for the next 4 to 6 weeks.  Patient is  being admitted for further management. BSE requested.    Assessment / Plan / Recommendation  Clinical Impression  Pt participated in limited clinical swallow evaluation due to Pt requiring significant encouragement for intake and stated, "I don't want any". No gross facial asymmetry. Pt with U/L dentition, however missing some teeth. Pt initially refused all PO intake, but then accepted sequential straw sips tea. This elicited audible and sequential swallows and delayed throat clear. No overt coughing. Pt's daughter reports that they have occasional concerns regarding "gulping" with swallows at home. No recent URI or PNA. Pt with only  minimal intake of puree and D2 (ground chicken and mashed potato). Vocal quality clear. Pt drifts back to sleep quickly and remained asleep while SLP spoke with Pt's daughter. Recommend continue diet as ordered, D2/thin and only feed when alert and upright, PO medications whole or crushed in puree and follow with liquid wash. SLP will follow during acute stay. Family is considering hospice. SLP Visit Diagnosis: Dysphagia, unspecified (R13.10)    Aspiration Risk  Mild aspiration risk;Risk for inadequate nutrition/hydration    Diet Recommendation Dysphagia 2 (Fine chop);Thin liquid   Liquid Administration via: Cup;Straw Medication Administration: Whole meds with puree Supervision: Staff to assist with self feeding;Full supervision/cueing for compensatory strategies Compensations: Slow rate;Small sips/bites Postural Changes: Seated upright at 90 degrees;Remain upright for at least 30 minutes after po intake    Other  Recommendations Oral Care Recommendations: Oral care BID;Staff/trained caregiver to provide oral care Other Recommendations: Clarify dietary restrictions    Recommendations for follow up therapy are one component of a multi-disciplinary discharge planning process, led by the attending physician.  Recommendations may be updated based on patient status, additional functional criteria and insurance authorization.  Follow up Recommendations Follow physician's recommendations for discharge plan and follow up therapies      Assistance Recommended at Discharge Frequent or constant Supervision/Assistance  Functional Status Assessment Patient has had a recent decline in their functional status and demonstrates the ability to make significant improvements in function in a reasonable and predictable amount of time.  Frequency and Duration min 2x/week  1 week       Prognosis Prognosis for Safe Diet Advancement: Fair Barriers to Reach Goals:  (lethargy, poor appetite)      Swallow  Study   General Date of Onset: 08/27/22 HPI: 88 year old female living with her daughter with dementia and very hard of hearing has not been acting her self for the last 3 to 4 days.  She has been wandering at home confused with very poor oral intake.  Patient was going to check the weather outside this morning and ended up falling on the ground.  She fell onto her left side.  She has been crying out and confused since the fall.  Unsure if she hit her head or not.  She complains of left-sided hip pain and groin pain.  X-rays confirm an acute displaced left femoral neck fracture.  Head CT also reported a tiny subdural hematoma.  She was noted to be dehydrated.  There was a large amount of stool in the colon.  She was hypokalemic and had a lactic acid of 5.0.  Orthopedics Dr. Amedeo Kinsman was consulted who reported that this is a high risk fracture and patient is not a candidate for repair.  Family does not want to proceed with surgical repair given her core morbidities and that her last hip repair was a very difficult recovery process for her several years ago.  They agreed to  making her DNR and to prefer the conservative management.  Her daughter can no longer care for her at home.  Her daughter has Parkinson's disease and unable to manage her in this condition.  Orthopedics recommended that patient be nonweightbearing for the next 4 to 6 weeks.  Patient is being admitted for further management. BSE requested. Type of Study: Bedside Swallow Evaluation Previous Swallow Assessment: N/A Diet Prior to this Study: Dysphagia 2 (chopped);Thin liquids Temperature Spikes Noted: No Respiratory Status: Room air History of Recent Intubation: No Behavior/Cognition: Alert;Cooperative (requires encouragement, doesn't want food/drink) Oral Cavity Assessment: Within Functional Limits Oral Care Completed by SLP: Yes Oral Cavity - Dentition: Missing dentition Vision: Functional for self-feeding Self-Feeding Abilities: Needs  assist Patient Positioning: Upright in bed Baseline Vocal Quality: Normal Volitional Cough: Weak Volitional Swallow: Able to elicit    Oral/Motor/Sensory Function Overall Oral Motor/Sensory Function: Within functional limits   Ice Chips Ice chips: Not tested   Thin Liquid Thin Liquid: Impaired Presentation: Cup;Straw Pharyngeal  Phase Impairments: Multiple swallows;Cough - Delayed (audible swallow)    Nectar Thick Nectar Thick Liquid: Not tested   Honey Thick Honey Thick Liquid: Not tested   Puree Puree: Within functional limits Presentation: Spoon   Solid     Solid: Within functional limits Presentation: Spoon Oral Phase Impairments: Reduced lingual movement/coordination     Thank you,  Genene Churn, [place]  Tavonna Worthington 08/29/2022,3:06 PM

## 2022-08-30 DIAGNOSIS — Z7189 Other specified counseling: Secondary | ICD-10-CM | POA: Diagnosis not present

## 2022-08-30 DIAGNOSIS — S72002A Fracture of unspecified part of neck of left femur, initial encounter for closed fracture: Secondary | ICD-10-CM | POA: Diagnosis not present

## 2022-08-30 DIAGNOSIS — Z515 Encounter for palliative care: Secondary | ICD-10-CM | POA: Diagnosis not present

## 2022-08-30 MED ORDER — SENNOSIDES-DOCUSATE SODIUM 8.6-50 MG PO TABS: 2.0000 | ORAL_TABLET | Freq: Every day | ORAL | Status: DC

## 2022-08-30 MED ORDER — BISACODYL 10 MG RE SUPP: 10.0000 mg | Freq: Every day | RECTAL | Status: DC

## 2022-08-30 MED ORDER — ALBUTEROL SULFATE (2.5 MG/3ML) 0.083% IN NEBU: 2.5000 mg | INHALATION_SOLUTION | RESPIRATORY_TRACT | Status: DC | PRN

## 2022-08-30 MED ORDER — FENTANYL CITRATE PF 50 MCG/ML IJ SOSY: 12.5000 ug | PREFILLED_SYRINGE | INTRAMUSCULAR | Status: DC | PRN

## 2022-08-30 MED ORDER — SENNOSIDES-DOCUSATE SODIUM 8.6-50 MG PO TABS
2.0000 | ORAL_TABLET | Freq: Every morning | ORAL | Status: DC
  Administered 2022-08-31 – 2022-09-01 (×2): 2 via ORAL
  Filled 2022-08-30 (×2): qty 2

## 2022-08-30 NOTE — Care Management Important Message (Deleted)
Important Message  Patient Details  Name: Brianna [name] MRN: [id number] Date of Birth: Dec 08, 1924   Medicare Important Message Given:  Yes     Tommy Medal 08/30/2022, 11:22 AM

## 2022-08-30 NOTE — TOC Initial Note (Signed)
Transition of Care [facility]) - Initial/Assessment Note    Patient Details  Name: Brianna [name] MRN: [id number] Date of Birth: Sep 20, 1925  Transition of Care [facility]) CM/SW Contact:    Ihor Gully, LCSW Phone Number: 08/30/2022, 3:04 PM  Clinical Narrative:                 Patient from home alone. Daughter, Vickii Chafe, stays at night. Discussed d/c plan. Vickii Chafe states that she does not yet know if she wants patient to go to SNF. Advised that her son will be in on Friday and wants to speak with palliative care regarding [place]. Discussed that patient would have to be authorized for rehab by her insurance company based on rehab potential. Discussed that if patient was authorized to go to rehab then patient would need to participate and if she was not able, insurance company would discontinue paying. Discussed that patient does not have a LTC payor source currently.    Expected Discharge Plan: [place] Barriers to Discharge: Continued Medical Work up   Patient Goals and CMS Choice        Expected Discharge Plan and Services Expected Discharge Plan: [place] arrangements for the past 2 months: Single Family Home                                      Prior Living Arrangements/Services Living arrangements for the past 2 months: Single Family Home Lives with:: Self                   Activities of Daily Living      Permission Sought/Granted                  Emotional Assessment              Admission diagnosis:  SDH (subdural hematoma) ([place]) [S06.5XAA] Left displaced femoral neck fracture ([place]) [S72.002A] Closed fracture of left hip, initial encounter ([place]) [S72.002A] Fall, initial encounter [W19.XXXA] Sepsis, due to unspecified organism, unspecified whether acute organ dysfunction present [facility]) [A41.9] Patient Active Problem List   Diagnosis Date Noted   Left displaced femoral neck fracture ([place])  08/27/2022   Lactic acidosis 08/27/2022   Hypokalemia 08/27/2022   Subdural hematoma ([place]) 08/27/2022   Obstipation 08/27/2022   Hyperglycemia 08/27/2022   DNR (do not resuscitate) 08/27/2022   Hip pain 08/27/2022   At risk for falling 08/27/2022   Surgical or procedure not carried out because of patient's decision 08/27/2022   Hypothyroidism 08/13/2019   Peripheral vascular insufficiency ([place]) 04/02/2018   Senile purpura ([place]) 04/02/2018   Simple chronic bronchitis ([place]) 04/02/2018   Enlarged heart 04/08/2015   Thoracic aortic atherosclerosis ([place]) 04/08/2015   Osteoporosis, post-menopausal 11/25/2014   Bilateral hand pain 04/13/2014   Vitamin D deficiency 08/18/2013   Colon cancer ([place]) 08/18/2013   Mitral valve disease 05/27/2013   Hyperlipidemia 03/25/2013   Expected blood loss anemia 03/05/2013   S/P right THA, AA 03/04/2013   Essential hypertension, benign 02/16/2013   COPD (chronic obstructive pulmonary disease) ([place]) 02/16/2013   Arthritis 10/12/2011   PCP:  Claretta Fraise, MD Pharmacy:   [facility], [address] [place] [address] Phone: [phone] Fax: [place], [address] [place] [address]  [place] [address] Phone: [phone] Fax: [phone]  [facility] ORDER) [place], [place] [id number] Phone: [phone] Fax: [phone]     Social Determinants of Health (SDOH) Interventions    Readmission Risk Interventions     No data to display

## 2022-08-30 NOTE — Progress Notes (Signed)
Speech Language Pathology Treatment: Dysphagia  Patient Details Name: Brianna [name] MRN: [id number] DOB: 02-Nov-1924 Today's Date: 08/30/2022 Time: 3244-0102 SLP Time Calculation (min) (ACUTE ONLY): 18 min  Assessment / Plan / Recommendation Clinical Impression  Pt with improved alertness this date and consumed ~8 ounces of chicken and mashed potato and 240 cc liquids. Pt without overt signs of symptoms of aspiration, although does continue to present with audible swallow. SLP will continue to follow during acute stay pending goals of care.     HPI HPI: 88 year old female living with her daughter with dementia and very hard of hearing has not been acting her self for the last 3 to 4 days.  She has been wandering at home confused with very poor oral intake.  Patient was going to check the weather outside this morning and ended up falling on the ground.  She fell onto her left side.  She has been crying out and confused since the fall.  Unsure if she hit her head or not.  She complains of left-sided hip pain and groin pain.  X-rays confirm an acute displaced left femoral neck fracture.  Head CT also reported a tiny subdural hematoma.  She was noted to be dehydrated.  There was a large amount of stool in the colon.  She was hypokalemic and had a lactic acid of 5.0.  Orthopedics Dr. Amedeo Kinsman was consulted who reported that this is a high risk fracture and patient is not a candidate for repair.  Family does not want to proceed with surgical repair given her core morbidities and that her last hip repair was a very difficult recovery process for her several years ago.  They agreed to making her DNR and to prefer the conservative management.  Her daughter can no longer care for her at home.  Her daughter has Parkinson's disease and unable to manage her in this condition.  Orthopedics recommended that patient be nonweightbearing for the next 4 to 6 weeks.  Patient is being admitted for further management. BSE  requested.      SLP Plan  Continue with current plan of care      Recommendations for follow up therapy are one component of a multi-disciplinary discharge planning process, led by the attending physician.  Recommendations may be updated based on patient status, additional functional criteria and insurance authorization.    Recommendations  Diet recommendations: Dysphagia 2 (fine chop);Thin liquid Liquids provided via: Straw Medication Administration: Whole meds with puree Supervision: Staff to assist with self feeding;Full supervision/cueing for compensatory strategies Compensations: Slow rate;Small sips/bites Postural Changes and/or Swallow Maneuvers: Seated upright 90 degrees;Upright 30-60 min after meal                Oral Care Recommendations: Oral care BID;Staff/trained caregiver to provide oral care Follow Up Recommendations: Follow physician's recommendations for discharge plan and follow up therapies Assistance recommended at discharge: Frequent or constant Supervision/Assistance SLP Visit Diagnosis: Dysphagia, unspecified (R13.10) Plan: Continue with current plan of care           [name]  08/30/2022, 1:40 PM

## 2022-08-30 NOTE — Progress Notes (Signed)
Palliative: Brianna [name] is lying quietly in bed.  She appears chronically ill and somewhat frail.  She is resting comfortably, but wakes when touch her arm.  She has hearing loss.  She will make an somewhat keep eye contact.  She is oriented to self only.  I ask where we are and she is unable to tell me.  I ask if we are in the hospital and she states no.  I offer food and drink which she declines.  I believe that she is unable to make her basic needs known.  There is no family at bedside at this time.   Call to daughter, healthcare surrogate, Brianna [name].  Vickii Chafe shares that she has questions about private pay at facility.  We briefly cover Medicare eligibility, Brianna [name] makes about $1500 per month in Social Security.  We also talk about the necessity to sell Brianna [name] home in order to pay for her care.  Brianna [name] asks, "even if the house is to come to me after her death?".  I share correct, [name] assets would be used for her care.  Vickii Chafe is able to tell me that she understands that the only way SNF stay is covered is under rehab.  She again states that she does not feel Brianna [name] is able to participate in rehab.  She continues to state that she does not have the capability to care for Brianna [name] at home even with hospice care.  Vickii Chafe tells me that she has been told to not let the hospital push her for decision making.  We again today talk about residential hospice.  Vickii Chafe shares that it is difficult for her to choose residential hospice when her mother is talking with her.  Vickii Chafe asks about a meeting with her son who will come in from [facility] Thursday evening.  I shared that hospitalist would be able to meet with him, palliative team will not be present on Friday.  She declines a telephone call to her son, stating that she wants him to meet here in the hospital.  Conference with attending, bedside nursing staff, transition of care team related to patient condition, needs, goals of  care, disposition.  Plan: At this point continue to treat the treatable but no CPR or intubation.  Daughter/healthcare surrogate continues to delay decision making.   PMT to continue to follow.  55 minutes  Quinn Axe, NP Palliative medicine team Team phone [phone] Greater than 50% of this time was spent counseling and coordinating care related to the above assessment and plan.

## 2022-08-30 NOTE — NC FL2 (Signed)
[place] MEDICAID FL2 LEVEL OF CARE SCREENING TOOL     IDENTIFICATION  Patient Name: Brianna [name] Birthdate: 1925-06-19 Sex: female Admission Date (Current Location): 08/27/2022  [facility] and [place] Number:  [employer] and Address:  [place] [address] [place]      Provider Number: [id number]  Attending Physician Name and Address:  Roxan Hockey, MD  Relative Name and Phone Number:       Current Level of Care: Hospital Recommended Level of Care: [place] Prior Approval Number:    Date Approved/Denied:   PASRR Number: [id number]  Discharge Plan: SNF    Current Diagnoses: Patient Active Problem List   Diagnosis Date Noted   Left displaced femoral neck fracture ([place]) 08/27/2022   Lactic acidosis 08/27/2022   Hypokalemia 08/27/2022   Subdural hematoma ([place]) 08/27/2022   Obstipation 08/27/2022   Hyperglycemia 08/27/2022   DNR (do not resuscitate) 08/27/2022   Hip pain 08/27/2022   At risk for falling 08/27/2022   Surgical or procedure not carried out because of patient's decision 08/27/2022   Hypothyroidism 08/13/2019   Peripheral vascular insufficiency ([place]) 04/02/2018   Senile purpura ([place]) 04/02/2018   Simple chronic bronchitis ([place]) 04/02/2018   Enlarged heart 04/08/2015   Thoracic aortic atherosclerosis ([place]) 04/08/2015   Osteoporosis, post-menopausal 11/25/2014   Bilateral hand pain 04/13/2014   Vitamin D deficiency 08/18/2013   Colon cancer ([place]) 08/18/2013   Mitral valve disease 05/27/2013   Hyperlipidemia 03/25/2013   Expected blood loss anemia 03/05/2013   S/P right THA, AA 03/04/2013   Essential hypertension, benign 02/16/2013   COPD (chronic obstructive pulmonary disease) ([place]) 02/16/2013   Arthritis 10/12/2011    Orientation RESPIRATION BLADDER Height & Weight     Self  O2 (2L) External catheter Weight: 95 lb 3.8 oz (43.2 kg) Height:  5' (152.4 cm)  BEHAVIORAL SYMPTOMS/MOOD  NEUROLOGICAL BOWEL NUTRITION STATUS      Incontinent Diet (Dysphagia 3 with thin liquids. See d/c summary for updates.)  AMBULATORY STATUS COMMUNICATION OF NEEDS Skin   Total Care Verbally Skin abrasions, Other (Comment) (Redness to arms and legs)                       Personal Care Assistance Level of Assistance  Bathing, Feeding, Dressing Bathing Assistance: Maximum assistance Feeding assistance: Limited assistance Dressing Assistance: Maximum assistance     Functional Limitations Info  Sight, Hearing, Speech Sight Info: Impaired Hearing Info: Impaired Speech Info: Adequate    SPECIAL CARE FACTORS FREQUENCY  PT (By licensed PT)     PT Frequency: 5x weekly (4-6 weeks non-weightbearing)              Contractures Contractures Info: Not present    Additional Factors Info  Code Status, Allergies Code Status Info: DNR Allergies Info: Colchicine, Alendronate Sodium, Lodine (etodolac), Risedronate Sodium           Current Medications (08/30/2022):  This is the current hospital active medication list Current Facility-Administered Medications  Medication Dose Route Frequency Provider Last Rate Last Admin   acetaminophen (TYLENOL) tablet 650 mg  650 mg Oral Q6H Johnson, Clanford L, MD   650 mg at 08/30/22 0504   Or   acetaminophen (TYLENOL) suppository 650 mg  650 mg Rectal Q6H Johnson, Clanford L, MD   650 mg at 08/30/22 1211   albuterol (PROVENTIL) (2.5 MG/3ML) 0.083% nebulizer solution 2.5 mg  2.5 mg Nebulization Q2H PRN Roxan Hockey, MD  bisacodyl (DULCOLAX) suppository 10 mg  10 mg Rectal QHS Emokpae, Courage, MD       fentaNYL (SUBLIMAZE) injection 12.5 mcg  12.5 mcg Intravenous Q4H PRN Emokpae, Courage, MD       lactated ringers infusion   Intravenous Continuous [place] Brick, Courage, MD 30 mL/hr at 08/29/22 0856 Rate Change at 08/29/22 0856   ondansetron (ZOFRAN) tablet 4 mg  4 mg Oral Q6H PRN Johnson, Clanford L, MD       Or   ondansetron (ZOFRAN)  injection 4 mg  4 mg Intravenous Q6H PRN Johnson, Clanford L, MD       Oral care mouth rinse  15 mL Mouth Rinse PRN Zierle-Ghosh, Asia B, DO       oxyCODONE (Oxy IR/ROXICODONE) immediate release tablet 5 mg  5 mg Oral Q6H PRN Johnson, Clanford L, MD   5 mg at 08/30/22 0504   [START ON 08/31/2022] senna-docusate (Senokot-S) tablet 2 tablet  2 tablet Oral q morning Roxan Hockey, MD         Discharge Medications: Please see discharge summary for a list of discharge medications.  Relevant Imaging Results:  Relevant Lab Results:   Additional Information SSN: [id number]. Patient has been vaccinated for COVID twice per daughter.  Melanye Hiraldo, Clydene Pugh, LCSW

## 2022-08-30 NOTE — Progress Notes (Addendum)
Patient given Oxycodone twice this shift, Verbalized hurts and pointed to left hip when checked patient to see if she was dry and clean. She slept through the night, Continued to monitor the patient.

## 2022-08-30 NOTE — Care Management Important Message (Signed)
Important Message  Patient Details  Name: Brianna [name] MRN: [id number] Date of Birth: [birth year]   Medicare Important Message Given:  N/A - LOS <3 / Initial given by admissions     Tommy Medal 08/30/2022, 11:23 AM

## 2022-08-30 NOTE — Progress Notes (Signed)
PROGRESS NOTE   Brianna [name]  PJA:[id number] DOB: November 09, 1924 DOA: 08/27/2022 PCP: Claretta Fraise, MD   Chief Complaint  Patient presents with   Fall   Level of care: Med-Surg  Brief Admission History:  88 year old female living with her daughter with dementia and very hard of hearing has not been acting her self for the last 3 to 4 days.  She has been wandering at home confused with very poor oral intake.  Patient was going to check the weather outside this morning and ended up falling on the ground.  She fell onto her left side.  She has been crying out and confused since the fall.  Unsure if she hit her head or not.  She complains of left-sided hip pain and groin pain.  X-rays confirm an acute displaced left femoral neck fracture.  Head CT also reported a tiny subdural hematoma.  She was noted to be dehydrated.  There was a large amount of stool in the colon.  She was hypokalemic and had a lactic acid of 5.0.  Orthopedics Dr. Amedeo Kinsman was consulted who reported that this is a high risk fracture and patient is not a candidate for repair.  Family does not want to proceed with surgical repair given her core morbidities and that her last hip repair was a very difficult recovery process for her several years ago.  They agreed to making her DNR and to prefer the conservative management.  Her daughter can no longer care for her at home.  Her daughter has Parkinson's disease and unable to manage her in this condition.  Orthopedics recommended that patient be nonweightbearing for the next 4 to 6 weeks.  Patient is being admitted for further management.   Assessment and Plan: * Left displaced femoral neck fracture ([place]) - Discussed with orthopedics, high risk fracture and patient is a poor operative candidate and family is against surgical repair and elected to conservative management -Ortho recommends nonweightbearing 4 to 6 weeks - Given high mortality associated with this fracture, consult palliative  medicine --  -Physical therapy recommends SNF rehab  Surgical or procedure not carried out because of patient's decision - Conservative management ONLY requested by daughter  -Physical therapy recommends SNF rehab  Subdural hematoma ([place]) - Likely hit head with fall, trace amount of hemorrhage noted, planning to recheck CT head without contrast to follow-up, family against any invasive procedures or surgical management at this time -- repeated CT head on 10/30 did not show any findings of hemorrhage (daughter notified of results on 10/30)  -Patient sleepy on and off  At risk for falling - Given dementia, fall precautions recommended,  -ambulate only with assistance with nonweightbearing to LLE .  HIGH FALL RISK.   Hip pain - Oral and IV pain management  Social/ethics  -DNR (do not resuscitate) -Dr. Wynetta Emery previously confirmed with daughter at bedside, given high mortality associated with untreated hip fracture, palliative consultation requested and appreciated -Please see palliative consult notes -Family is not quite ready for hospice -We will attempt SNF rehab as recommended by physical therapist  Hyperglycemia - Possibly a stress response,  -Oral intake is poor -Patient is now at risk for hypoglycemia given poor oral intake  Obstipation - Large amount of stool noted in the colon,  -Treated with enema and laxatives  -Continue Senokot-S and Dulcolax  Hypokalemia -Repeat less than normalized  Lactic acidosis - Likely secondary to dehydration, hydrating with IV fluids, recheck lactate  Hypothyroidism -May resume levothyroxine when safe to  give oral intake  Thoracic aortic atherosclerosis (HCC) - Stable  Osteoporosis, post-menopausal - Managed with calcium and vitamin D supplementation  Mitral valve disease - Stable  Hyperlipidemia May resume Lipitor when oral intake more reliable  Expected blood loss anemia--in the setting of hip fracture - conservative  management only per family request  S/P right THA, AA - Daughter reports patient had a difficult time recovering from this surgery 10 years ago and against going forward with surgery for the acute fracture  COPD (chronic obstructive pulmonary disease) ([place]) - Home meds bronchodilators as needed  Essential hypertension, benign - Home meds  Disposition----   DVT prophylaxis: TED hose Code Status: DNR  Family Communication: Palliative care provider had face-to-face meeting with daughter on 08/30/2022 Disposition: -Family is not quite ready for hospice -We will attempt SNF rehab as recommended by physical therapist  Status is: Inpatient Remains inpatient appropriate because: intensity of needs--IV pain medication for pain control as needed   Consultants:  Orthopedics (telephone) Palliative medicine  Procedures:   Antimicrobials:    Subjective: -Sleepy on and off -Appears comfortable after as needed medication was administered --Family is not quite ready for hospice -We will attempt SNF rehab as recommended by physical therapist  Objective: Vitals:   08/29/22 1406 08/29/22 2123 08/30/22 0510 08/30/22 1404  BP: 102/64 (!) 152/82 (!) 150/65 (!) 151/86  Pulse: 84 87 80 69  Resp: '20 18 16   '$ Temp: 97.6 F (36.4 C) 97.6 F (36.4 C) 97.7 F (36.5 C) 98.2 F (36.8 C)  TempSrc: Oral  Oral Oral  SpO2: 100% 100% 100% 100%  Weight:      Height:        Intake/Output Summary (Last 24 hours) at 08/30/2022 1619 Last data filed at 08/30/2022 1500 Gross per 24 hour  Intake 200 ml  Output 200 ml  Net 0 ml   Filed Weights   08/27/22 0906  Weight: 43.2 kg   Examination:  General exam: frail emaciated female, sleepy on and off,  hard of hearing, with dementia.   Respiratory system: Fair air movement, no wheezing Cardiovascular system: normal S1 & S2 heard. No pedal edema. Gastrointestinal system: Abdomen is ND, soft and NT, +BS Central nervous system: Sleeping on and off, no  new focal neurological deficits. Extremities: left leg rotated out shortened, discomfort with range of motion and palpation Skin: No rashes, lesions or ulcers. Psychiatry: Judgement and insight appear poor. Mood & affect unable to determine. -Underlying cognitive and memory deficits consistent with dementia  Data Reviewed: I have personally reviewed following labs and imaging studies  CBC: Recent Labs  Lab 08/27/22 0914 08/28/22 0442  WBC 6.6 11.7*  NEUTROABS 2.9  --   HGB 10.9* 9.9*  HCT 35.0* 31.4*  MCV 90.0 87.7  PLT 232 622    Basic Metabolic Panel: Recent Labs  Lab 08/27/22 0914 08/28/22 0442  NA 135 134*  K 3.0* 3.6  CL 101 99  CO2 17* 25  GLUCOSE 222* 109*  BUN 24* 20  CREATININE 1.02* 0.81  CALCIUM 8.9 9.1  MG  --  1.7    CBG: No results for input(s): "GLUCAP" in the last 168 hours.  Recent Results (from the past 240 hour(s))  Blood culture (routine x 2)     Status: None (Preliminary result)   Collection Time: 08/27/22  9:23 AM   Specimen: BLOOD RIGHT FOREARM  Result Value Ref Range Status   Specimen Description   Final    BLOOD RIGHT FOREARM  BOTTLES DRAWN AEROBIC AND ANAEROBIC   Special Requests Blood Culture adequate volume  Final   Culture   Final    NO GROWTH 3 DAYS Performed at [facility], [address] [place], [place] [address]    Report Status PENDING  Incomplete  Blood culture (routine x 2)     Status: None (Preliminary result)   Collection Time: 08/27/22  2:40 PM   Specimen: BLOOD LEFT FOREARM  Result Value Ref Range Status   Specimen Description   Final    BLOOD LEFT FOREARM BOTTLES DRAWN AEROBIC AND ANAEROBIC   Special Requests Blood Culture adequate volume  Final   Culture   Final    NO GROWTH 3 DAYS Performed at [facility], [address] [place], [place] [address]    Report Status PENDING  Incomplete     Radiology Studies: No results found.  Scheduled Meds:  acetaminophen  650 mg Oral Q6H   Or   acetaminophen  650 mg  Rectal Q6H   QUEtiapine  12.5 mg Oral QHS   senna-docusate  1 tablet Oral BID   Continuous Infusions:  lactated ringers 30 mL/hr at 08/29/22 0856     LOS: 3 days    Roxan Hockey, MD How to contact the [facility] Attending or Consulting provider [place] or covering provider during after hours [place], for this patient?  Check the care team in [facility] and look for a) attending/consulting TR[facility] provider listed and b) the [facility] team listed Log into [email or web address] and use [place]'s universal password to access. If you do not have the password, please contact the hospital operator. Locate the [facility] provider you are looking for under [facility] and page to a number that you can be directly reached. If you still have difficulty reaching the provider, please page the [facility] (Director on Call) for the Hospitalists listed on amion for assistance.  08/30/2022, 4:19 PM

## 2022-08-31 DIAGNOSIS — S72002A Fracture of unspecified part of neck of left femur, initial encounter for closed fracture: Secondary | ICD-10-CM | POA: Diagnosis not present

## 2022-08-31 NOTE — Progress Notes (Signed)
Speech Language Pathology Treatment: Dysphagia  Patient Details Name: Brianna [name] MRN: [id number] DOB: 15-Mar-1925 Today's Date: 08/31/2022 Time: 0932-3557 SLP Time Calculation (min) (ACUTE ONLY): 19 min  Assessment / Plan / Recommendation Clinical Impression  Ongoing diagnostic dysphagia therapy provided today; Brianna [name] was resting but easily rouse and accepted minimal thin trials. NT reports Brianna [name] ate 75% of breakfast and 50% of lunch without incident. Brianna [name] only accepted a few thin trials but she consumed thin via straw without overt s/sx of aspiration. Recommend continue with D2/fine chop and thin liquids. ST will continue to follow  HPI HPI: 88 year old female living with her daughter with dementia and very hard of hearing has not been acting her self for the last 3 to 4 days.  She has been wandering at home confused with very poor oral intake.  Patient was going to check the weather outside this morning and ended up falling on the ground.  She fell onto her left side.  She has been crying out and confused since the fall.  Unsure if she hit her head or not.  She complains of left-sided hip pain and groin pain.  X-rays confirm an acute displaced left femoral neck fracture.  Head CT also reported a tiny subdural hematoma.  She was noted to be dehydrated.  There was a large amount of stool in the colon.  She was hypokalemic and had a lactic acid of 5.0.  Orthopedics Dr. Amedeo Kinsman was consulted who reported that this is a high risk fracture and patient is not a candidate for repair.  Family does not want to proceed with surgical repair given her core morbidities and that her last hip repair was a very difficult recovery process for her several years ago.  They agreed to making her DNR and to prefer the conservative management.  Her daughter can no longer care for her at home.  Her daughter has Parkinson's disease and unable to manage her in this condition.  Orthopedics recommended that patient be nonweightbearing for  the next 4 to 6 weeks.  Patient is being admitted for further management. BSE requested.      SLP Plan  Continue with current plan of care      Recommendations for follow up therapy are one component of a multi-disciplinary discharge planning process, led by the attending physician.  Recommendations may be updated based on patient status, additional functional criteria and insurance authorization.    Recommendations  Diet recommendations: Dysphagia 2 (fine chop);Thin liquid Liquids provided via: Straw Medication Administration: Whole meds with puree Supervision: Staff to assist with self feeding;Full supervision/cueing for compensatory strategies Compensations: Slow rate;Small sips/bites Postural Changes and/or Swallow Maneuvers: Seated upright 90 degrees;Upright 30-60 min after meal                Oral Care Recommendations: Oral care BID;Staff/trained caregiver to provide oral care Follow Up Recommendations: Follow physician's recommendations for discharge plan and follow up therapies Assistance recommended at discharge: Frequent or constant Supervision/Assistance SLP Visit Diagnosis: Dysphagia, unspecified (R13.10) Plan: Continue with current plan of care         Landry Lookingbill H. Roddie Mc, CCC-SLP Speech Language Pathologist   Wende Bushy  08/31/2022, 3:28 PM

## 2022-08-31 NOTE — Progress Notes (Signed)
Physical Therapy Treatment Patient Details Name: Brianna [name] MRN: [id number] DOB: [birth year] Today's Date: 08/31/2022   History of Present Illness Brianna [name] is a 88 year old female living with her daughter with dementia and very hard of hearing has not been acting her self for the last 3 to 4 days.  She has been wandering at home confused with very poor oral intake.  Patient was going to check the weather outside this morning and ended up falling on the ground.  She fell onto her left side.  She has been crying out and confused since the fall.  Unsure if she hit her head or not.  She complains of left-sided hip pain and groin pain.  X-rays confirm an acute displaced left femoral neck fracture.  Head CT also reported a tiny subdural hematoma.  She was noted to be dehydrated.  There was a large amount of stool in the colon.  She was hypokalemic and had a lactic acid of 5.0.  Orthopedics Dr. Amedeo Kinsman was consulted who reported that this is a high risk fracture and patient is not a candidate for repair.  Family does not want to proceed with surgical repair given her core morbidities and that her last hip repair was a very difficult recovery process for her several years ago.  They agreed to making her DNR and to prefer the conservative management.  Her daughter can no longer care for her at home.  Her daughter has Parkinson's disease and unable to manage her in this condition.  Orthopedics recommended that patient be nonweightbearing for the next 4 to 6 weeks.  Patient is being admitted for further management.    PT Comments    Patient demonstrates slow labored movement for sitting up at bedside with c/o severe pain left hip, tolerated sitting up at bedside with fair/poor sitting balance, frequent leaning backwards, but able to keep trunk in midline most of time.  Patient demonstrates poor carryover for maintaining NWB LLE using RW and required Max stand pivot with RLE blocked to transfer to chair.   Patient tolerated sitting up in chair after therapy with family member present in room - nursing staff notified.  Patient will benefit from continued skilled physical therapy in hospital and recommended venue below to increase strength, balance, endurance for safe ADLs and gait.     Recommendations for follow up therapy are one component of a multi-disciplinary discharge planning process, led by the attending physician.  Recommendations may be updated based on patient status, additional functional criteria and insurance authorization.  Follow Up Recommendations  Skilled nursing-short term rehab (<3 hours/day) Can patient physically be transported by private vehicle: No   Assistance Recommended at Discharge    Patient can return home with the following A lot of help with bathing/dressing/bathroom;A lot of help with walking and/or transfers;Help with stairs or ramp for entrance;Assistance with cooking/housework   Equipment Recommendations  Rolling walker (2 wheels)    Recommendations for Other Services       Precautions / Restrictions Precautions Precautions: Fall Restrictions Weight Bearing Restrictions: Yes LLE Weight Bearing: Non weight bearing     Mobility  Bed Mobility Overal bed mobility: Needs Assistance Bed Mobility: Supine to Sit, Sit to Supine     Supine to sit: Max assist Sit to supine: Mod assist, Max assist   General bed mobility comments: slow labored movement with c/o severe pain with movement of LLE    Transfers Overall transfer level: Needs assistance Equipment used: 1 person hand held  assist Transfers: Sit to/from Stand, Bed to chair/wheelchair/BSC Sit to Stand: Max assist   Step pivot transfers: Max assist       General transfer comment: poor carryover for maintaining NWB LLE when attempting use of RW, required Max stand pivot with RLE blocked to transfer to chair    Ambulation/Gait                   Stairs             Wheelchair  Mobility    Modified Rankin (Stroke Patients Only)       Balance Overall balance assessment: Needs assistance Sitting-balance support: Feet supported, No upper extremity supported Sitting balance-Leahy Scale: Poor Sitting balance - Comments: fair/poor seated at EOB due to increasing left hip pain Postural control: Posterior lean, Right lateral lean Standing balance support: During functional activity, No upper extremity supported, Bilateral upper extremity supported Standing balance-Leahy Scale: Poor Standing balance comment: using RW                            Cognition Arousal/Alertness: Awake/alert Behavior During Therapy: Anxious Overall Cognitive Status: History of cognitive impairments - at baseline                                          Exercises      General Comments        Pertinent Vitals/Pain Pain Assessment Pain Assessment: Faces Faces Pain Scale: Hurts even more Pain Location: left hip with movement Pain Descriptors / Indicators: Sore, Grimacing, Guarding Pain Intervention(s): Limited activity within patient's tolerance, Monitored during session, Repositioned    Home Living                          Prior Function            PT Goals (current goals can now be found in the care plan section) Acute Rehab PT Goals Patient Stated Goal: return home PT Goal Formulation: With patient/family Time For Goal Achievement: 09/11/22 Potential to Achieve Goals: Fair Progress towards PT goals: Progressing toward goals    Frequency    Min 3X/week      PT Plan Current plan remains appropriate    Co-evaluation              AM-PAC PT "6 Clicks" Mobility   Outcome Measure  Help needed turning from your back to your side while in a flat bed without using bedrails?: A Lot Help needed moving from lying on your back to sitting on the side of a flat bed without using bedrails?: A Lot Help needed moving to and from  a bed to a chair (including a wheelchair)?: A Lot Help needed standing up from a chair using your arms (e.g., wheelchair or bedside chair)?: A Lot Help needed to walk in hospital room?: Total Help needed climbing 3-5 steps with a railing? : Total 6 Click Score: 10    End of Session   Activity Tolerance: Patient tolerated treatment well;Patient limited by fatigue;Patient limited by pain Patient left: in chair;with call bell/phone within reach;with chair alarm set;with family/visitor present Nurse Communication: Mobility status PT Visit Diagnosis: Unsteadiness on feet (R26.81);Other abnormalities of gait and mobility (R26.89);Muscle weakness (generalized) (M62.81)     Time: 7902-4097 PT Time Calculation (min) (ACUTE ONLY): 20 min  Charges:  $Therapeutic Activity: 8-22 mins                     3:26 PM, 08/31/22 Lonell Grandchild, MPT Physical Therapist with [facility] [phone] office [phone] mobile phone

## 2022-08-31 NOTE — Progress Notes (Signed)
PROGRESS NOTE   Brianna [name]  LTJ:[id number] DOB: 03-04-1925 DOA: 08/27/2022 PCP: Claretta Fraise, MD   Chief Complaint  Patient presents with   Fall   Level of care: Med-Surg  Brief Admission History:  88 year old female living with her daughter with dementia and very hard of hearing has not been acting her self for the last 3 to 4 days.  She has been wandering at home confused with very poor oral intake.  Patient was going to check the weather outside this morning and ended up falling on the ground.  She fell onto her left side.  She has been crying out and confused since the fall.  Unsure if she hit her head or not.  She complains of left-sided hip pain and groin pain.  X-rays confirm an acute displaced left femoral neck fracture.  Head CT also reported a tiny subdural hematoma.  She was noted to be dehydrated.  There was a large amount of stool in the colon.  She was hypokalemic and had a lactic acid of 5.0.  Orthopedics Dr. Amedeo Kinsman was consulted who reported that this is a high risk fracture and patient is not a candidate for repair.  Family does not want to proceed with surgical repair given her core morbidities and that her last hip repair was a very difficult recovery process for her several years ago.  They agreed to making her DNR and to prefer the conservative management.  Her daughter can no longer care for her at home.  Her daughter has Parkinson's disease and unable to manage her in this condition.  Orthopedics recommended that patient be nonweightbearing for the next 4 to 6 weeks.  Patient is being admitted for further management.   Assessment and Plan: * Left displaced femoral neck fracture ([place]) - Discussed with orthopedics, high risk fracture and patient is a poor operative candidate and family is against surgical repair and elected to conservative management -Ortho recommends nonweightbearing 4 to 6 weeks - Given high mortality associated with this fracture, consult palliative  medicine --  -Physical therapy recommends SNF rehab -awaiting insurance authorization for possible transfer to SNF rehab as recommended by physical therapist  Surgical or procedure not carried out because of patient's decision - Conservative management ONLY requested by daughter  -Physical therapy recommends SNF rehab -awaiting insurance authorization for possible transfer to SNF rehab as recommended by physical therapist  Subdural hematoma ([place]) - Likely hit head with fall, trace amount of hemorrhage noted, planning to recheck CT head without contrast to follow-up, family against any invasive procedures or surgical management at this time -- repeated CT head on 10/30 did not show any findings of hemorrhage (daughter notified of results on 10/30)  -Patient sleepy on and off  At risk for falling - Given dementia, fall precautions recommended,  -ambulate only with assistance with nonweightbearing to LLE .  HIGH FALL RISK.   Dysphagia- Speech pathologist consult appreciated -Recommends Dysphagia 2 (fine chop);Thin liquid   Hip pain - Oral and IV pain management  Social/ethics  -DNR (do not resuscitate) -Dr. Wynetta Emery previously confirmed with daughter at bedside, given high mortality associated with untreated hip fracture, palliative consultation requested and appreciated -Please see palliative consult notes -Family is not quite ready for hospice -We will attempt SNF rehab as recommended by physical therapist  Hyperglycemia - Possibly a stress response,  -Oral intake is poor -Patient is now at risk for hypoglycemia given poor oral intake  Obstipation - Large amount of stool noted in  the colon,  -Treated with enema and laxatives  -Continue Senokot-S and Dulcolax  Hypokalemia -Repeat less than normalized  Lactic acidosis - Likely secondary to dehydration, hydrating with IV fluids, recheck lactate  Hypothyroidism -May resume levothyroxine when safe to give oral  intake  Thoracic aortic atherosclerosis ([place]) - Stable  Osteoporosis, post-menopausal - Managed with calcium and vitamin D supplementation  Mitral valve disease - Stable  Hyperlipidemia May resume Lipitor when oral intake more reliable  Expected blood loss anemia--in the setting of hip fracture - conservative management only per family request  S/P right THA, AA - Daughter reports patient had a difficult time recovering from this surgery 10 years ago and against going forward with surgery for the acute fracture  COPD (chronic obstructive pulmonary disease) ([place]) - Home meds bronchodilators as needed  Essential hypertension, benign - Home meds  Disposition----awaiting insurance authorization for possible transfer to SNF rehab as recommended by physical therapist  DVT prophylaxis: TED hose Code Status: DNR  Family Communication: Palliative care provider had face-to-face meeting with daughter on 08/30/2022 Disposition: -Family is not quite ready for hospice -awaiting insurance authorization for possible transfer to SNF rehab as recommended by physical therapist  Status is: Inpatient Remains inpatient appropriate because: intensity of needs--IV pain medication for pain control as needed   Consultants:  Orthopedics (telephone) Palliative medicine  Procedures:   Antimicrobials:    Subjective: -A bit more awake today, --Family is not quite ready for hospice -Continues to require as needed pain medications  Objective: Vitals:   08/30/22 1404 08/30/22 2248 08/31/22 0554 08/31/22 1321  BP: (!) 151/86 (!) 154/99 (!) 164/92 (!) 165/93  Pulse: 69 91 83 (!) 109  Resp:  18 17   Temp: 98.2 F (36.8 C) 97.7 F (36.5 C) 97.7 F (36.5 C) 97.8 F (36.6 C)  TempSrc: Oral Oral  Oral  SpO2: 100% 100% 100% 97%  Weight:      Height:        Intake/Output Summary (Last 24 hours) at 08/31/2022 1812 Last data filed at 08/31/2022 1623 Gross per 24 hour  Intake 1568.37 ml  Output  1050 ml  Net 518.37 ml   Filed Weights   08/27/22 0906  Weight: 43.2 kg   Examination:  General exam: frail emaciated female, sleepy on and off,  hard of hearing, with dementia.   Respiratory system: Fair air movement, no wheezing Cardiovascular system: normal S1 & S2 heard. No pedal edema. Gastrointestinal system: Abdomen is ND, soft and NT, +BS Central nervous system: Sleeping on and off, no new focal neurological deficits. Extremities: left leg rotated out shortened, discomfort with range of motion and palpation Skin: No rashes, lesions or ulcers. Psychiatry: Judgement and insight appear poor. Mood & affect unable to determine. -Underlying cognitive and memory deficits consistent with dementia  Data Reviewed: I have personally reviewed following labs and imaging studies  CBC: Recent Labs  Lab 08/27/22 0914 08/28/22 0442  WBC 6.6 11.7*  NEUTROABS 2.9  --   HGB 10.9* 9.9*  HCT 35.0* 31.4*  MCV 90.0 87.7  PLT 232 481    Basic Metabolic Panel: Recent Labs  Lab 08/27/22 0914 08/28/22 0442  NA 135 134*  K 3.0* 3.6  CL 101 99  CO2 17* 25  GLUCOSE 222* 109*  BUN 24* 20  CREATININE 1.02* 0.81  CALCIUM 8.9 9.1  MG  --  1.7    CBG: No results for input(s): "GLUCAP" in the last 168 hours.  Recent Results (from the past  240 hour(s))  Blood culture (routine x 2)     Status: None (Preliminary result)   Collection Time: 08/27/22  9:23 AM   Specimen: BLOOD RIGHT FOREARM  Result Value Ref Range Status   Specimen Description   Final    BLOOD RIGHT FOREARM BOTTLES DRAWN AEROBIC AND ANAEROBIC   Special Requests Blood Culture adequate volume  Final   Culture   Final    NO GROWTH 4 DAYS Performed at [facility], [address] [place], [place] [address]    Report Status PENDING  Incomplete  Blood culture (routine x 2)     Status: None (Preliminary result)   Collection Time: 08/27/22  2:40 PM   Specimen: BLOOD LEFT FOREARM  Result Value Ref Range Status   Specimen  Description   Final    BLOOD LEFT FOREARM BOTTLES DRAWN AEROBIC AND ANAEROBIC   Special Requests Blood Culture adequate volume  Final   Culture   Final    NO GROWTH 4 DAYS Performed at [facility], [address] [place], [place] [address]    Report Status PENDING  Incomplete    Radiology Studies: No results found.  Scheduled Meds:  acetaminophen  650 mg Oral Q6H   Or   acetaminophen  650 mg Rectal Q6H   bisacodyl  10 mg Rectal QHS   senna-docusate  2 tablet Oral q morning   Continuous Infusions:  lactated ringers 10 mL/hr at 08/30/22 1854    LOS: 4 days   Roxan Hockey, MD How to contact the [facility] Attending or Consulting provider [place] or covering provider during after hours [place], for this patient?  Check the care team in [facility] and look for a) attending/consulting [facility] provider listed and b) the [facility] team listed Log into [email or web address] and use [place] universal password to access. If you do not have the password, please contact the hospital operator. Locate the [facility] provider you are looking for under [facility] and page to a number that you can be directly reached. If you still have difficulty reaching the provider, please page the [facility] (Director on Call) for the Hospitalists listed on amion for assistance.  08/31/2022, 6:12 PM

## 2022-08-31 NOTE — Progress Notes (Signed)
Patient slept on an off this shift. She took her scheduled tylenol with no issues, crushed in apple sauce. Continued to monitor patient.

## 2022-09-01 DIAGNOSIS — S72002A Fracture of unspecified part of neck of left femur, initial encounter for closed fracture: Secondary | ICD-10-CM | POA: Diagnosis not present

## 2022-09-01 LAB — BASIC METABOLIC PANEL
Anion gap: 12 (ref 5–15)
BUN: 13 mg/dL (ref 8–23)
CO2: 25 mmol/L (ref 22–32)
Calcium: 8.7 mg/dL — ABNORMAL LOW (ref 8.9–10.3)
Chloride: 100 mmol/L (ref 98–111)
Creatinine, Ser: 0.7 mg/dL (ref 0.44–1.00)
GFR, Estimated: >60 mL/min (ref >60)
Glucose, Bld: 100 mg/dL — ABNORMAL HIGH (ref 70–99)
Potassium: 3.8 mmol/L (ref 3.5–5.1)
Sodium: 137 mmol/L (ref 135–145)

## 2022-09-01 LAB — CBC
HCT: 36.1 % (ref 36.0–46.0)
Hemoglobin: 11.4 g/dL — ABNORMAL LOW (ref 12.0–15.0)
MCH: 28.2 pg (ref 26.0–34.0)
MCHC: 31.6 g/dL (ref 30.0–36.0)
MCV: 89.4 fL (ref 80.0–100.0)
Platelets: 180 10*3/uL (ref 150–400)
RBC: 4.04 MIL/uL (ref 3.87–5.11)
RDW: 16.8 % — ABNORMAL HIGH (ref 11.5–15.5)
WBC: 7.8 10*3/uL (ref 4.0–10.5)
nRBC: 0 % (ref 0.0–0.2)

## 2022-09-01 LAB — CULTURE, BLOOD (ROUTINE X 2)
Culture: NO GROWTH
Culture: NO GROWTH
Special Requests: ADEQUATE
Special Requests: ADEQUATE

## 2022-09-01 MED ORDER — SENNOSIDES-DOCUSATE SODIUM 8.6-50 MG PO TABS: 2.0000 | ORAL_TABLET | Freq: Every evening | ORAL | Status: DC | PRN

## 2022-09-01 MED ORDER — LORAZEPAM 2 MG/ML IJ SOLN: 0.5000 mg | Freq: Four times a day (QID) | INTRAMUSCULAR | Status: DC | PRN

## 2022-09-01 NOTE — Progress Notes (Signed)
Patient was confused and uncooperative most of the night. She refused all medications PO and due to the rectal tube was unable to receive suppository medications. She complained of pain in her hip but still refused all medications. She pulled out the IV on her right side, but was still able to receive LR in left forearm. Unable to get morning VS, but we were able to explain to her that the machine was not trying to kill her. She is stable and has calmed down from bedding change.

## 2022-09-01 NOTE — Progress Notes (Signed)
Speech Language Pathology Treatment: Dysphagia  Patient Details Name: Brianna [name] MRN: [id number] DOB: 03-21-1925 Today's Date: 09/01/2022 Time: 5277-8242 SLP Time Calculation (min) (ACUTE ONLY): 24 min  Assessment / Plan / Recommendation Clinical Impression  Ongoing diagnostic dysphagia therapy provided while Pt was sitting upright in bed; Pt was pleasant and confused. Pt initially refused breakfast presentation but with verbal support and encouragement did eat some of her D2/thin tray and note some prolonged oral prep followed by occasional throat clearing with thin liquids. LPN administered meds whole with puree while SLP was present and note Pt benefits from cues including "these are your medications from your doctor, don't chew them we need to swallow them" and providing thin liquids to facilitate oral clearance and swallowing meds. Pt is continues to be somewhat paranoid and benefits from reassurance that she is safe and we are taking care of her, meds are from her doctor, etc. When Pt is assured of these things she is more likely to consume meals and take meds provided. ST will continue efforts.    HPI HPI: 88 year old female living with her daughter with dementia and very hard of hearing has not been acting her self for the last 3 to 4 days.  She has been wandering at home confused with very poor oral intake.  Patient was going to check the weather outside this morning and ended up falling on the ground.  She fell onto her left side.  She has been crying out and confused since the fall.  Unsure if she hit her head or not.  She complains of left-sided hip pain and groin pain.  X-rays confirm an acute displaced left femoral neck fracture.  Head CT also reported a tiny subdural hematoma.  She was noted to be dehydrated.  There was a large amount of stool in the colon.  She was hypokalemic and had a lactic acid of 5.0.  Orthopedics Dr. Amedeo Kinsman was consulted who reported that this is a high risk  fracture and patient is not a candidate for repair.  Family does not want to proceed with surgical repair given her core morbidities and that her last hip repair was a very difficult recovery process for her several years ago.  They agreed to making her DNR and to prefer the conservative management.  Her daughter can no longer care for her at home.  Her daughter has Parkinson's disease and unable to manage her in this condition.  Orthopedics recommended that patient be nonweightbearing for the next 4 to 6 weeks.  Patient is being admitted for further management. BSE requested.      SLP Plan  Continue with current plan of care      Recommendations for follow up therapy are one component of a multi-disciplinary discharge planning process, led by the attending physician.  Recommendations may be updated based on patient status, additional functional criteria and insurance authorization.    Recommendations  Diet recommendations: Dysphagia 2 (fine chop);Thin liquid Liquids provided via: Straw Medication Administration: Whole meds with puree Supervision: Staff to assist with self feeding;Full supervision/cueing for compensatory strategies Compensations: Slow rate;Small sips/bites Postural Changes and/or Swallow Maneuvers: Seated upright 90 degrees;Upright 30-60 min after meal                Oral Care Recommendations: Oral care BID;Staff/trained caregiver to provide oral care Follow Up Recommendations: Follow physician's recommendations for discharge plan and follow up therapies Assistance recommended at discharge: Frequent or constant Supervision/Assistance SLP Visit Diagnosis: Dysphagia, unspecified (R13.10)  Plan: Continue with current plan of care          Kodee Ravert H. Roddie Mc, CCC-SLP Speech Language Pathologist  Wende Bushy  09/01/2022, 10:23 AM

## 2022-09-01 NOTE — Progress Notes (Signed)
PROGRESS NOTE   Brianna [name]  YIR:[id number] DOB: September 13, 1925 DOA: 08/27/2022 PCP: Claretta Fraise, MD   Chief Complaint  Patient presents with   Fall   Level of care: Med-Surg  Brief Admission History:  88 year old female living with her daughter with dementia and very hard of hearing has not been acting her self for the last 3 to 4 days.  She has been wandering at home confused with very poor oral intake.  Patient was going to check the weather outside this morning and ended up falling on the ground.  She fell onto her left side.  She has been crying out and confused since the fall.  Unsure if she hit her head or not.  She complains of left-sided hip pain and groin pain.  X-rays confirm an acute displaced left femoral neck fracture.  Head CT also reported a tiny subdural hematoma.  She was noted to be dehydrated.  There was a large amount of stool in the colon.  She was hypokalemic and had a lactic acid of 5.0.  Orthopedics Dr. Amedeo Kinsman was consulted who reported that this is a high risk fracture and patient is not a candidate for repair.  Family does not want to proceed with surgical repair given her core morbidities and that her last hip repair was a very difficult recovery process for her several years ago.  They agreed to making her DNR and to prefer the conservative management.  Her daughter can no longer care for her at home.  Her daughter has Parkinson's disease and unable to manage her in this condition.  Orthopedics recommended that patient be nonweightbearing for the next 4 to 6 weeks.  Patient is being admitted for further management.   Assessment and Plan: * Left displaced femoral neck fracture ([place]) - Discussed with orthopedics, high risk fracture and patient is a poor operative candidate and family is against surgical repair and elected to conservative management -Ortho recommends nonweightbearing 4 to 6 weeks - Given high mortality associated with this fracture, consult palliative  medicine --  -Physical therapy recommends SNF rehab -awaiting insurance authorization for possible transfer to SNF rehab as recommended by physical therapist  Surgical or procedure not carried out because of patient's decision - Conservative management ONLY requested by daughter  -Physical therapy recommends SNF rehab -awaiting insurance authorization for possible transfer to SNF rehab as recommended by physical therapist  Subdural hematoma ([place]) - Likely hit head with fall, trace amount of hemorrhage noted, planning to recheck CT head without contrast to follow-up, family against any invasive procedures or surgical management at this time -- repeated CT head on 10/30 did not show any findings of hemorrhage (daughter notified of results on 10/30)   At risk for falling - Given dementia, fall precautions recommended,  -ambulate only with assistance with nonweightbearing to LLE .  HIGH FALL RISK.   Dysphagia- Speech pathologist consult appreciated -Recommends Dysphagia 2 (fine chop);Thin liquid   Hip pain - Oral and IV pain management  Social/ethics  -DNR (do not resuscitate) -Dr. Wynetta Emery previously confirmed with daughter at bedside, given high mortality associated with untreated hip fracture, palliative consultation requested and appreciated -Please see palliative consult notes -We will attempt SNF rehab as recommended by physical therapist -09/01/22 -Patient sleepy on and off -Patient's daughter and grandson are ok with hospice consult and possible transfer to residential hospice hospice  Hyperglycemia - Possibly a stress response,  -Oral intake is poor -Patient is now at risk for hypoglycemia given poor oral  intake  Obstipation - Large amount of stool noted in the colon,  -Treated with enema and laxatives  -Continue Senokot-S and Dulcolax  Hypokalemia -Repeat less than normalized  Lactic acidosis - Likely secondary to dehydration, hydrating with IV fluids, recheck  lactate  Hypothyroidism -May resume levothyroxine when safe to give oral intake  Thoracic aortic atherosclerosis ([place]) - Stable  Osteoporosis, post-menopausal - Managed with calcium and vitamin D supplementation  Mitral valve disease - Stable  Hyperlipidemia May resume Lipitor when oral intake more reliable  Expected blood loss anemia--in the setting of hip fracture - conservative management only per family request  S/P right THA, AA - Daughter reports patient had a difficult time recovering from this surgery 10 years ago and against going forward with surgery for the acute fracture  COPD (chronic obstructive pulmonary disease) ([place]) - Home meds bronchodilators as needed  Essential hypertension, benign - Home meds  Disposition----awaiting insurance authorization for possible transfer to SNF rehab as recommended by physical therapist -09/01/22 -Patient sleepy on and off -Patient's daughter and grandson are ok with hospice consult and possible transfer to residential hospice hospice  DVT prophylaxis: TED hose Code Status: DNR  Family Communication: Palliative care provider had face-to-face meeting with daughter on 08/30/2022 Disposition: -Family is not quite ready for hospice -awaiting insurance authorization for possible transfer to SNF rehab as recommended by physical therapist  Status is: Inpatient Remains inpatient appropriate because: intensity of needs--IV pain medication for pain control as needed   Consultants:  Orthopedics (telephone) Palliative medicine  Procedures:   Antimicrobials:    Subjective: -09/01/22 -Patient sleepy on and off -Patient's daughter and grandson are ok with hospice consult and possible transfer to residential hospice hospice -Continues to require as needed pain medications  Objective: Vitals:   08/31/22 1321 08/31/22 2148 09/01/22 0744 09/01/22 1516  BP: (!) 165/93 (!) 193/104 (!) 168/94 (!) 151/90  Pulse: (!) 109 (!) 55 94 89   Resp:  19  18  Temp: 97.8 F (36.6 C) 98.4 F (36.9 C)  98.1 F (36.7 C)  TempSrc: Oral   Oral  SpO2: 97% 99%  98%  Weight:      Height:        Intake/Output Summary (Last 24 hours) at 09/01/2022 1653 Last data filed at 09/01/2022 1200 Gross per 24 hour  Intake 240 ml  Output 100 ml  Net 140 ml   Filed Weights   08/27/22 0906  Weight: 43.2 kg   Examination:  General exam: frail emaciated female, sleepy on and off,  hard of hearing, with dementia.   Respiratory system: Fair air movement, no wheezing Cardiovascular system: normal S1 & S2 heard. No pedal edema. Gastrointestinal system: Abdomen is ND, soft and NT, +BS Central nervous system: Sleeping on and off, no new focal neurological deficits. Extremities: left leg rotated out shortened, discomfort with range of motion and palpation Skin: No rashes, lesions or ulcers. Psychiatry: Judgement and insight appear poor. Mood & affect unable to determine. -Underlying cognitive and memory deficits consistent with dementia  Data Reviewed: I have personally reviewed following labs and imaging studies  CBC: Recent Labs  Lab 08/27/22 0914 08/28/22 0442 09/01/22 0359  WBC 6.6 11.7* 7.8  NEUTROABS 2.9  --   --   HGB 10.9* 9.9* 11.4*  HCT 35.0* 31.4* 36.1  MCV 90.0 87.7 89.4  PLT 232 213 151    Basic Metabolic Panel: Recent Labs  Lab 08/27/22 0914 08/28/22 0442 09/01/22 0359  NA 135 134* 137  K 3.0* 3.6 3.8  CL 101 99 100  CO2 17* 25 25  GLUCOSE 222* 109* 100*  BUN 24* 20 13  CREATININE 1.02* 0.81 0.70  CALCIUM 8.9 9.1 8.7*  MG  --  1.7  --     CBG: No results for input(s): "GLUCAP" in the last 168 hours.  Recent Results (from the past 240 hour(s))  Blood culture (routine x 2)     Status: None   Collection Time: 08/27/22  9:23 AM   Specimen: BLOOD RIGHT FOREARM  Result Value Ref Range Status   Specimen Description   Final    BLOOD RIGHT FOREARM BOTTLES DRAWN AEROBIC AND ANAEROBIC   Special Requests Blood  Culture adequate volume  Final   Culture   Final    NO GROWTH 5 DAYS Performed at [facility], [address] [place], [place] [address]    Report Status 09/01/2022 FINAL  Final  Blood culture (routine x 2)     Status: None   Collection Time: 08/27/22  2:40 PM   Specimen: BLOOD LEFT FOREARM  Result Value Ref Range Status   Specimen Description   Final    BLOOD LEFT FOREARM BOTTLES DRAWN AEROBIC AND ANAEROBIC   Special Requests Blood Culture adequate volume  Final   Culture   Final    NO GROWTH 5 DAYS Performed at [facility], [address] [place], [place] [address]    Report Status 09/01/2022 FINAL  Final    Radiology Studies: No results found.  Scheduled Meds:  acetaminophen  650 mg Oral Q6H   Or   acetaminophen  650 mg Rectal Q6H   bisacodyl  10 mg Rectal QHS   senna-docusate  2 tablet Oral q morning   Continuous Infusions:  lactated ringers 10 mL/hr at 08/30/22 1854    LOS: 5 days   Roxan Hockey, MD How to contact the [facility] Attending or Consulting provider [place] or covering provider during after hours [place], for this patient?  Check the care team in [facility] and look for a) attending/consulting TRH provider listed and b) the [facility] team listed Log into [email or web address] and use [place]'s universal password to access. If you do not have the password, please contact the hospital operator. Locate the [facility] provider you are looking for under [facility] and page to a number that you can be directly reached. If you still have difficulty reaching the provider, please page the [facility] (Director on Call) for the Hospitalists listed on amion for assistance.  09/01/2022, 4:53 PM

## 2022-09-01 NOTE — Care Management Important Message (Addendum)
Important Message  Patient Details  Name: Brianna [name] MRN: [id number] Date of Birth: [birth year]   Medicare Important Message Given:  Yes, reviewed letter with daughter Stevie Kern at [phone], copy will be mailed to address on file. Tommy Medal 09/01/2022, 11:17 AM

## 2022-09-01 NOTE — Progress Notes (Signed)
Noted patient pulling IV, removing oxygen and purewick. Patient reoriented several times regarding the importance of IV, purewick and oxygen. [place] applied after several attempts of educating patient. MD Courage made aware.

## 2022-09-02 DIAGNOSIS — S72002A Fracture of unspecified part of neck of left femur, initial encounter for closed fracture: Secondary | ICD-10-CM | POA: Diagnosis not present

## 2022-09-02 MED ORDER — SCOPOLAMINE 1 MG/3DAYS TD PT72
1.0000 | MEDICATED_PATCH | TRANSDERMAL | Status: DC
  Administered 2022-09-02 – 2022-09-05 (×2): 1.5 mg via TRANSDERMAL
  Filled 2022-09-02 (×2): qty 1

## 2022-09-02 NOTE — TOC Progression Note (Addendum)
Transition of Care [facility]) - Progression Note    Patient Details  Name: YAMEL BALE MRN: [id number] Date of Birth: Jan 03, 1925  Transition of [facility]) CM/SW Contact  Shade Flood, LCSW Phone Number: 09/02/2022, 1:27 PM  Clinical Narrative:     TOC following. Was informed by [facility] colleague that hospice would re-evaluate pt this AM to determine pt's eligibility. This LCSW reached out to hospice to inquire and was informed that they do not have anyone scheduled to see pt for evaluation until Monday AM. Updated MD. Family reportedly wants to wait until hospice assesses before they decide if they want to have pt to transfer to SNF for rehab instead.  TOC will follow.  1413: Spoke with pt's grandson by phone who states hospice RN was there to see pt this AM and that they need to re-evaluate her on Monday. Yolanda Bonine confirms that the family's first choice is for pt to dc to [facility]. If they say no, they would like [facility] referral for inpatient hospice. If they say no, family will decide on rehab versus taking pt home.  This LCSW explained insurance coverage for rehab and that once rehab stay is no longer covered by insurance that pt would not be obligated to stay at the SNF private pay and that family could take patient home at that time. Grandson expressed understanding.  Expected Discharge Plan: [place] Barriers to Discharge: Continued Medical Work up  Expected Discharge Plan and Services Expected Discharge Plan: [place] arrangements for the past 2 months: Single Family Home                                       Social Determinants of Health (SDOH) Interventions    Readmission Risk Interventions     No data to display

## 2022-09-02 NOTE — Progress Notes (Signed)
Nurse tech called this writer to room, noted rectal tube was pushed out after patient had bowel movement. MD Courage made aware. Per MD okay to discontinue due to patient having formed stools.

## 2022-09-02 NOTE — Progress Notes (Addendum)
PROGRESS NOTE   Brianna [name]  DQQ:[id number] DOB: 1925/07/20 DOA: 08/27/2022 PCP: Claretta Fraise, MD   Chief Complaint  Patient presents with   Fall   Level of care: Med-Surg  Brief Admission History:  88 year old female living with her daughter with dementia and very hard of hearing has not been acting her self for the last 3 to 4 days.  She has been wandering at home confused with very poor oral intake.  Patient was going to check the weather outside this morning and ended up falling on the ground.  She fell onto her left side.  She has been crying out and confused since the fall.  Unsure if she hit her head or not.  She complains of left-sided hip pain and groin pain.  X-rays confirm an acute displaced left femoral neck fracture.  Head CT also reported a tiny subdural hematoma.  She was noted to be dehydrated.  There was a large amount of stool in the colon.  She was hypokalemic and had a lactic acid of 5.0.  Orthopedics Dr. Amedeo Kinsman was consulted who reported that this is a high risk fracture and patient is not a candidate for repair.  Family does not want to proceed with surgical repair given her core morbidities and that her last hip repair was a very difficult recovery process for her several years ago.  They agreed to making her DNR and to prefer the conservative management.  Her daughter can no longer care for her at home.  Her daughter has Parkinson's disease and unable to manage her in this condition.  Orthopedics recommended that patient be nonweightbearing for the next 4 to 6 weeks.  Patient is being admitted for further management.   Assessment and Plan: * Left displaced femoral neck fracture ([place]) - Discussed with orthopedics, high risk fracture and patient is a poor operative candidate and family is against surgical repair and elected to conservative management -Ortho recommends nonweightbearing 4 to 6 weeks - Given high mortality associated with this fracture, consult palliative  medicine --  -Physical therapy recommends SNF rehab --We will attempt SNF rehab as recommended by physical therapist if unable to go to residential hospice house  Surgical or procedure not carried out because of patient's decision - Conservative management ONLY requested by daughter  -Physical therapy recommends SNF rehab --We will attempt SNF rehab as recommended by physical therapist if unable to go to residential hospice house  Subdural hematoma ([place]) - Likely hit head with fall, trace amount of hemorrhage noted, planning to recheck CT head without contrast to follow-up, family against any invasive procedures or surgical management at this time -- repeated CT head on 10/30 did not show any findings of hemorrhage (daughter notified of results on 10/30)   At risk for falling - Given dementia, fall precautions recommended,  -ambulate only with assistance with nonweightbearing to LLE .  HIGH FALL RISK.   Dysphagia- Speech pathologist consult appreciated -Recommends Dysphagia 2 (fine chop);Thin liquid   Hip pain - Oral and IV pain management  Social/ethics  -DNR (do not resuscitate) -Dr. Wynetta Emery previously confirmed with daughter at bedside, given high mortality associated with untreated hip fracture, palliative consultation requested and appreciated -Please see palliative consult notes -We will attempt SNF rehab as recommended by physical therapist if unable to go to residential hospice house -09/02/22 -Patient sleepy on and off -Awaiting hospice consult -Patient's daughter and grandson are ok with hospice consult and possible transfer to residential hospice hospice  Hyperglycemia -  Possibly a stress response,  -Oral intake is poor -Patient is now at risk for hypoglycemia given poor oral intake  Obstipation - Large amount of stool noted in the colon,  -Treated with enema and laxatives  -Continue Senokot-S and Dulcolax  Hypokalemia -Repeat less than normalized  Lactic  acidosis - Likely secondary to dehydration, hydrating with IV fluids, recheck lactate  Hypothyroidism -May resume levothyroxine when safe to give oral intake  Thoracic aortic atherosclerosis ([place]) - Stable  Osteoporosis, post-menopausal - Managed with calcium and vitamin D supplementation  Mitral valve disease - Stable  Hyperlipidemia May resume Lipitor when oral intake more reliable  Expected blood loss anemia--in the setting of hip fracture - conservative management only per family request  S/P right THA, AA - Daughter reports patient had a difficult time recovering from this surgery 10 years ago and against going forward with surgery for the acute fracture  COPD (chronic obstructive pulmonary disease) ([place]) - Home meds bronchodilators as needed  Essential hypertension, benign - Home meds  Disposition----awaiting insurance authorization for possible transfer to SNF rehab as recommended by physical therapist -09/02/22 -Patient sleepy on and off -Awaiting hospice consult -Patient's daughter and grandson are ok with hospice consult and possible transfer to residential hospice hospice  DVT prophylaxis: TED hose Code Status: DNR  Family Communication: Updated daughter and grandson at bedside  disposition: -Family request hospice consult --We will attempt SNF rehab as recommended by physical therapist if unable to go to residential hospice house  Status is: Inpatient Remains inpatient appropriate because: intensity of needs--IV pain medication for pain control as needed   Consultants:  Orthopedics (telephone) Palliative medicine  Procedures:   Antimicrobials:    Subjective: -09/02/22 -Rectal tube removed -Patient had formed BM -Patient sleepy on and off -Patient's daughter and grandson are ok with hospice consult and possible transfer to residential hospice hospice -Continues to require as needed pain medications --We will attempt SNF rehab as recommended by  physical therapist if unable to go to residential hospice house  Objective: Vitals:   09/01/22 0744 09/01/22 1516 09/01/22 2116 09/02/22 0603  BP: (!) 168/94 (!) 151/90 125/74 133/71  Pulse: 94 89 83 87  Resp:  '18 18 18  '$ Temp:  98.1 F (36.7 C) 98.1 F (36.7 C) 97.6 F (36.4 C)  TempSrc:  Oral  Oral  SpO2:  98% 100% 100%  Weight:      Height:        Intake/Output Summary (Last 24 hours) at 09/02/2022 1026 Last data filed at 09/02/2022 3220 Gross per 24 hour  Intake 320 ml  Output 800 ml  Net -480 ml   Filed Weights   08/27/22 0906  Weight: 43.2 kg   Examination:  General exam: frail, emaciated female, sleepy on and off,  hard of hearing, with dementia.   Respiratory system: Fair air movement, no wheezing Cardiovascular system: normal S1 & S2 heard. No pedal edema. Gastrointestinal system: Abdomen is ND, soft and NT, +BS, rectal tube removed on 09/02/2022 Central nervous system: Sleeping on and off, no new focal neurological deficits. Extremities: left leg rotated out shortened, discomfort with range of motion and palpation Skin: No rashes, lesions or ulcers. Psychiatry: Judgement and insight appear poor. Mood & affect unable to determine. -Underlying cognitive and memory deficits consistent with dementia  Data Reviewed: I have personally reviewed following labs and imaging studies  CBC: Recent Labs  Lab 08/27/22 0914 08/28/22 0442 09/01/22 0359  WBC 6.6 11.7* 7.8  NEUTROABS 2.9  --   --  HGB 10.9* 9.9* 11.4*  HCT 35.0* 31.4* 36.1  MCV 90.0 87.7 89.4  PLT 232 213 037    Basic Metabolic Panel: Recent Labs  Lab 08/27/22 0914 08/28/22 0442 09/01/22 0359  NA 135 134* 137  K 3.0* 3.6 3.8  CL 101 99 100  CO2 17* 25 25  GLUCOSE 222* 109* 100*  BUN 24* 20 13  CREATININE 1.02* 0.81 0.70  CALCIUM 8.9 9.1 8.7*  MG  --  1.7  --     CBG: No results for input(s): "GLUCAP" in the last 168 hours.  Recent Results (from the past 240 hour(s))  Blood culture  (routine x 2)     Status: None   Collection Time: 08/27/22  9:23 AM   Specimen: BLOOD RIGHT FOREARM  Result Value Ref Range Status   Specimen Description   Final    BLOOD RIGHT FOREARM BOTTLES DRAWN AEROBIC AND ANAEROBIC   Special Requests Blood Culture adequate volume  Final   Culture   Final    NO GROWTH 5 DAYS Performed at [facility], [address], [place], [place] [address]    Report Status 09/01/2022 FINAL  Final  Blood culture (routine x 2)     Status: None   Collection Time: 08/27/22  2:40 PM   Specimen: BLOOD LEFT FOREARM  Result Value Ref Range Status   Specimen Description   Final    BLOOD LEFT FOREARM BOTTLES DRAWN AEROBIC AND ANAEROBIC   Special Requests Blood Culture adequate volume  Final   Culture   Final    NO GROWTH 5 DAYS Performed at [facility], [address], [place], [place] [address]    Report Status 09/01/2022 FINAL  Final    Radiology Studies: No results found.  Scheduled Meds:  acetaminophen  650 mg Oral Q6H   Or   acetaminophen  650 mg Rectal Q6H   Continuous Infusions:  lactated ringers 10 mL/hr at 08/30/22 1854    LOS: 6 days   Roxan Hockey, MD How to contact the [facility] Attending or Consulting provider [place] or covering provider during after hours [place], for this patient?  Check the care team in [facility] and look for a) attending/consulting TRH provider listed and b) the [facility] team listed Log into [email or web address] and use [place]'s universal password to access. If you do not have the password, please contact the hospital operator. Locate the [facility] provider you are looking for under [facility] and page to a number that you can be directly reached. If you still have difficulty reaching the provider, please page the [facility] (Director on Call) for the Hospitalists listed on amion for assistance.  09/02/2022, 10:26 AM

## 2022-09-02 NOTE — Progress Notes (Signed)
Noted patient had increased restlessness, responds to pain, patient coughing up sputum. MD Courage made aware.

## 2022-09-03 ENCOUNTER — Encounter (HOSPITAL_COMMUNITY): Payer: Self-pay | Admitting: Family Medicine

## 2022-09-03 DIAGNOSIS — S72002A Fracture of unspecified part of neck of left femur, initial encounter for closed fracture: Secondary | ICD-10-CM | POA: Diagnosis not present

## 2022-09-03 MED ORDER — FENTANYL CITRATE PF 50 MCG/ML IJ SOSY: 25.0000 ug | PREFILLED_SYRINGE | INTRAMUSCULAR | Status: DC | PRN

## 2022-09-03 NOTE — Progress Notes (Signed)
PROGRESS NOTE   Brianna [name]  NTZ:[id number] DOB: Feb 17, 1925 DOA: 08/27/2022 PCP: Claretta Fraise, MD   Chief Complaint  Patient presents with   Fall   Level of care: Med-Surg  Brief Admission History:  88 year old female living with her daughter with dementia and very hard of hearing has not been acting her self for the last 3 to 4 days.  She has been wandering at home confused with very poor oral intake.  Patient was going to check the weather outside this morning and ended up falling on the ground.  She fell onto her left side.  She has been crying out and confused since the fall.  Unsure if she hit her head or not.  She complains of left-sided hip pain and groin pain.  X-rays confirm an acute displaced left femoral neck fracture.  Head CT also reported a tiny subdural hematoma.  She was noted to be dehydrated.  There was a large amount of stool in the colon.  She was hypokalemic and had a lactic acid of 5.0.  Orthopedics Dr. Amedeo Kinsman was consulted who reported that this is a high risk fracture and patient is not a candidate for repair.  Family does not want to proceed with surgical repair given her core morbidities and that her last hip repair was a very difficult recovery process for her several years ago.  They agreed to making her DNR and to prefer the conservative management.  Her daughter can no longer care for her at home.  Her daughter has Parkinson's disease and unable to manage her in this condition.  Orthopedics recommended that patient be nonweightbearing for the next 4 to 6 weeks.  Patient is being admitted for further management.   Assessment and Plan: * Left displaced femoral neck fracture ([place]) - Discussed with orthopedics, high risk fracture and patient is a poor operative candidate and family is against surgical repair and elected to conservative management -Ortho recommends nonweightbearing 4 to 6 weeks - Given high mortality associated with this fracture, consult palliative  medicine --  -Physical therapy recommends SNF rehab --We will attempt SNF rehab as recommended by physical therapist if unable to go to residential hospice house -Continue IV fentanyl as needed which has been adjusted  Surgical or procedure not carried out because of patient's decision--patient is a very poor surgical candidate - Conservative management ONLY requested by daughter  -Physical therapy recommends SNF rehab --We will attempt SNF rehab as recommended by physical therapist if unable to go to residential hospice house  Subdural hematoma ([place]) - Likely hit head with fall, trace amount of hemorrhage noted, planning to recheck CT head without contrast to follow-up, family against any invasive procedures or surgical management at this time -- repeated CT head on 08/28/22 did not show any findings of hemorrhage (daughter notified of results on 10/30)   At risk for falling - Given dementia, fall precautions recommended,  -ambulate only with assistance with nonweightbearing to LLE .  HIGH FALL RISK.   Dysphagia- Speech pathologist consult appreciated -Recommends Dysphagia 2 (fine chop);Thin liquid   Hip pain - Oral and IV pain management  Social/ethics  -DNR (do not resuscitate) -Discussed with patient grandson and  daughter at bedside, given high mortality associated with untreated hip fracture, -Please see palliative consult notes -We will attempt SNF rehab as recommended by physical therapist if unable to go to residential hospice house -09/03/22 -Patient sleepy on and off --Patient's daughter and grandson are ok with possible transfer to residential  hospice hospice -IV fentanyl and IV lorazepam adjusted for better pain and anxiety/restlessness control -Scopolamine patch ordered for excessive secretions -As per family limited interventions at this time,  limit blood draws and other investigative studies  Hyperglycemia - Possibly a stress response,  -Oral intake is  poor -Patient is now at risk for hypoglycemia given poor oral intake  Obstipation - Large amount of stool noted in the colon,  -Treated with enema and laxatives  -- Hypokalemia -Repeat and has normalized  Lactic acidosis - Likely secondary to dehydration, hydrating with IV fluids, recheck lactate  Hypothyroidism -May resume levothyroxine when safe to give oral intake  Thoracic aortic atherosclerosis ([place]) - Stable  Osteoporosis, post-menopausal - Managed with calcium and vitamin D supplementation  Mitral valve disease - Stable  Hyperlipidemia May resume Lipitor when oral intake more reliable  Expected blood loss anemia--in the setting of hip fracture - conservative management only per family request  S/P right THA, AA - Daughter reports patient had a difficult time recovering from this surgery 10 years ago and against going forward with surgery for the acute fracture  COPD (chronic obstructive pulmonary disease) ([place]) - Home meds bronchodilators as needed  Essential hypertension, benign - Home meds  Disposition----awaiting insurance authorization for possible transfer to SNF rehab as recommended by physical therapist -09/03/22 -Patient's daughter and grandson are ok with possible transfer to residential hospice hospice  DVT prophylaxis: TED hose Code Status: DNR  Family Communication: Updated daughter and grandson at bedside  disposition: --Patient's daughter and grandson are ok with possible transfer to residential hospice hospice  Status is: Inpatient Remains inpatient appropriate because: intensity of needs--IV pain medication for pain control as needed   Consultants:  Orthopedics (telephone) Palliative medicine  Procedures:   Antimicrobials:    Subjective: -09/03/22 -Patient sleepy on and off --Patient's daughter and grandson are ok with possible transfer to residential hospice hospice -IV fentanyl and IV lorazepam adjusted for better pain and  anxiety/restlessness control -Scopolamine patch ordered for excessive secretions -As per family limited interventions at this time,  limit blood draws and other investigative studies  Objective: Vitals:   09/02/22 1412 09/02/22 2128 09/03/22 0546 09/03/22 1308  BP: 99/69 (!) 139/96 120/63 (!) 113/55  Pulse:  85 91 (!) 53  Resp:  20 16   Temp:  97.6 F (36.4 C) 97.6 F (36.4 C)   TempSrc:  Oral Oral   SpO2:  93% 99% 98%  Weight:      Height:        Intake/Output Summary (Last 24 hours) at 09/03/2022 1322 Last data filed at 09/03/2022 1100 Gross per 24 hour  Intake 480 ml  Output 250 ml  Net 230 ml   Filed Weights   08/27/22 0906  Weight: 43.2 kg   Examination:  General exam: frail, emaciated female, sleepy on and off,  hard of hearing, with dementia.   Respiratory system: Fair air movement, no wheezing Cardiovascular system: normal S1 & S2 heard. No pedal edema. Gastrointestinal system: Abdomen is ND, soft and NT, +BS, rectal tube removed on 09/02/2022 Central nervous system: Sleeping on and off, no new focal neurological deficits. Extremities: left leg rotated out shortened, discomfort with range of motion and palpation Skin: No rashes, lesions or ulcers. Psychiatry: Judgement and insight appear poor. Mood & affect unable to determine. -Underlying cognitive and memory deficits consistent with dementia  Data Reviewed: I have personally reviewed following labs and imaging studies  CBC: Recent Labs  Lab 08/28/22 0442 09/01/22 0359  WBC 11.7* 7.8  HGB 9.9* 11.4*  HCT 31.4* 36.1  MCV 87.7 89.4  PLT 213 938    Basic Metabolic Panel: Recent Labs  Lab 08/28/22 0442 09/01/22 0359  NA 134* 137  K 3.6 3.8  CL 99 100  CO2 25 25  GLUCOSE 109* 100*  BUN 20 13  CREATININE 0.81 0.70  CALCIUM 9.1 8.7*  MG 1.7  --     CBG: No results for input(s): "GLUCAP" in the last 168 hours.  Recent Results (from the past 240 hour(s))  Blood culture (routine x 2)     Status:  None   Collection Time: 08/27/22  9:23 AM   Specimen: BLOOD RIGHT FOREARM  Result Value Ref Range Status   Specimen Description   Final    BLOOD RIGHT FOREARM BOTTLES DRAWN AEROBIC AND ANAEROBIC   Special Requests Blood Culture adequate volume  Final   Culture   Final    NO GROWTH 5 DAYS Performed at [facility], [address], [place], [place] [address]    Report Status 09/01/2022 FINAL  Final  Blood culture (routine x 2)     Status: None   Collection Time: 08/27/22  2:40 PM   Specimen: BLOOD LEFT FOREARM  Result Value Ref Range Status   Specimen Description   Final    BLOOD LEFT FOREARM BOTTLES DRAWN AEROBIC AND ANAEROBIC   Special Requests Blood Culture adequate volume  Final   Culture   Final    NO GROWTH 5 DAYS Performed at [facility], [address] [place], [place] [address]    Report Status 09/01/2022 FINAL  Final    Radiology Studies: No results found.  Scheduled Meds:  acetaminophen  650 mg Oral Q6H   Or   acetaminophen  650 mg Rectal Q6H   scopolamine  1 patch Transdermal Q72H   Continuous Infusions:  lactated ringers 10 mL/hr at 08/30/22 1854    LOS: 7 days   Roxan Hockey, MD How to contact the [facility] Attending or Consulting provider [place] or covering provider during after hours [place], for this patient?  Check the care team in [facility] and look for a) attending/consulting TRH provider listed and b) the [facility] team listed Log into [email or web address] and use [place]'s universal password to access. If you do not have the password, please contact the hospital operator. Locate the [facility] provider you are looking for under [facility] and page to a number that you can be directly reached. If you still have difficulty reaching the provider, please page the [facility] (Director on Call) for the Hospitalists listed on amion for assistance.  09/03/2022, 1:22 PM

## 2022-09-03 NOTE — Progress Notes (Addendum)
Pt has slept most of the day. Was able to get pt to take 1800 dose of tylenol. Pt has also only had 100 cc's of output today. MD made aware. No c/o of pain or discomfort at this time. Will continue to monitor pt.

## 2022-09-04 DIAGNOSIS — W19XXXA Unspecified fall, initial encounter: Secondary | ICD-10-CM | POA: Diagnosis not present

## 2022-09-04 DIAGNOSIS — Z7189 Other specified counseling: Secondary | ICD-10-CM

## 2022-09-04 DIAGNOSIS — S72002A Fracture of unspecified part of neck of left femur, initial encounter for closed fracture: Secondary | ICD-10-CM | POA: Diagnosis not present

## 2022-09-04 DIAGNOSIS — Z515 Encounter for palliative care: Secondary | ICD-10-CM

## 2022-09-04 NOTE — TOC Progression Note (Signed)
Transition of Care [facility]) - Progression Note    Patient Details  Name: Brianna [name] MRN: [id number] Date of Birth: 02/20/1925  Transition of Care [facility]) CM/SW Contact  Ihor Gully, LCSW Phone Number: 09/04/2022, 3:02 PM  Clinical Narrative:    Beth with [facility] assessed patient again today. Will reassess tomorrow for [facility] appropriateness.    Expected Discharge Plan: [place] Barriers to Discharge: Continued Medical Work up  Expected Discharge Plan and Services Expected Discharge Plan: [place] arrangements for the past 2 months: Single Family Home                                       Social Determinants of Health (SDOH) Interventions    Readmission Risk Interventions     No data to display

## 2022-09-04 NOTE — Progress Notes (Signed)
Patient slept through the night. Awakened minimally for vital signs, turning and repositioning. Routine pain medication administered as ordered. Safety maintained, call bell within reach, bed in lowest position, bed alarm on and functioning without any issues, floor mats in place. Will continue to monitor and endorse.

## 2022-09-04 NOTE — Progress Notes (Signed)
PROGRESS NOTE   Brianna [name]  XTG:[id number] DOB: 1925-02-02 DOA: 08/27/2022 PCP: Claretta Fraise, MD   Chief Complaint  Patient presents with   Fall   Level of care: Med-Surg  Brief Admission History:  88 year old female living with her daughter with dementia and very hard of hearing has not been acting her self for the last 3 to 4 days.  She has been wandering at home confused with very poor oral intake.  Patient was going to check the weather outside this morning and ended up falling on the ground.  She fell onto her left side.  She has been crying out and confused since the fall.  Unsure if she hit her head or not.  She complains of left-sided hip pain and groin pain.  X-rays confirm an acute displaced left femoral neck fracture.  Head CT also reported a tiny subdural hematoma.  She was noted to be dehydrated.  There was a large amount of stool in the colon.  She was hypokalemic and had a lactic acid of 5.0.  Orthopedics Dr. Amedeo Kinsman was consulted who reported that this is a high risk fracture and patient is not a candidate for repair.  Family does not want to proceed with surgical repair given her core morbidities and that her last hip repair was a very difficult recovery process for her several years ago.  They agreed to making her DNR and to prefer the conservative management.  Her daughter can no longer care for her at home.  Her daughter has Parkinson's disease and unable to manage her in this condition.  Orthopedics recommended that patient be nonweightbearing for the next 4 to 6 weeks.  Patient is being admitted for further management.   Assessment and Plan: * Left displaced femoral neck fracture ([place]) - Discussed with orthopedics, high risk fracture and patient is a poor operative candidate and family is against surgical repair and elected to conservative management -Ortho recommends nonweightbearing 4 to 6 weeks - Given high mortality associated with this fracture, consult palliative  medicine --  -Physical therapy recommends SNF rehab --We will attempt SNF rehab as recommended by physical therapist if unable to go to residential hospice house -Continue IV fentanyl as needed which has been adjusted  Surgical or procedure not carried out because of patient's decision--patient is a very poor surgical candidate - Conservative management ONLY requested by daughter  -Physical therapy recommends SNF rehab --We will attempt SNF rehab as recommended by physical therapist if unable to go to residential hospice house  Subdural hematoma ([place]) - Likely hit head with fall, trace amount of hemorrhage noted, planning to recheck CT head without contrast to follow-up, family against any invasive procedures or surgical management at this time -- repeated CT head on 08/28/22 did not show any findings of hemorrhage (daughter notified of results on 10/30)   At risk for falling - Given dementia, fall precautions recommended,  -ambulate only with assistance with nonweightbearing to LLE .  HIGH FALL RISK.   Dysphagia- Speech pathologist consult appreciated -Recommends Dysphagia 2 (fine chop);Thin liquid   Hip pain - Oral and IV pain management  Social/ethics  -DNR (do not resuscitate) -Discussed with patient grandson and  daughter at bedside, given high mortality associated with untreated hip fracture, -Please see palliative consult notes -We will attempt SNF rehab as recommended by physical therapist if unable to go to residential hospice house -09/04/22 -Patient sleepy on and off --Patient's daughter and grandson are ok with possible transfer to residential  hospice hospice -IV fentanyl and IV lorazepam adjusted for better pain and anxiety/restlessness control -Scopolamine patch ordered for excessive secretions -As per family limited interventions at this time,  limit blood draws and other investigative studies -Hospice RN here to reevaluate patient on 09/04/2022--- possible transfer  to residential hospice/in the next 24 hours after reevaluation in a.m. by hospice team  Hyperglycemia - Possibly a stress response,  -Oral intake is poor -Patient is now at risk for hypoglycemia given poor oral intake  Obstipation - Large amount of stool noted in the colon,  -Treated with enema and laxatives  -- Hypokalemia -Repeat and has normalized  Lactic acidosis - Likely secondary to dehydration, hydrating with IV fluids, recheck lactate  Hypothyroidism -May resume levothyroxine when safe to give oral intake  Thoracic aortic atherosclerosis ([place]) - Stable  Osteoporosis, post-menopausal - Managed with calcium and vitamin D supplementation  Mitral valve disease - Stable  Hyperlipidemia May resume Lipitor when oral intake more reliable  Expected blood loss anemia--in the setting of hip fracture - conservative management only per family request  S/P right THA, AA - Daughter reports patient had a difficult time recovering from this surgery 10 years ago and against going forward with surgery for the acute fracture  COPD (chronic obstructive pulmonary disease) ([place]) - Home meds bronchodilators as needed  Essential hypertension, benign - Home meds  Disposition---- possible transfer to SNF rehab as recommended by physical therapist -Patient's daughter and grandson are ok with possible transfer to residential hospice hospice --Hospice RN here to reevaluate patient on 09/04/2022--- possible transfer to residential hospice/in the next 24 hours after reevaluation in a.m. by hospice team  DVT prophylaxis: TED hose Code Status: DNR  Family Communication: Updated daughter and grandson at bedside  disposition: --Patient's daughter and grandson are ok with possible transfer to residential hospice hospice  Status is: Inpatient Remains inpatient appropriate because: intensity of needs--IV pain medication for pain control as needed   Consultants:  Orthopedics  (telephone) Palliative medicine  Procedures:   Antimicrobials:    Subjective: -09/04/22 -Patient sleepy on and off -Daughter is at bedside -Oral intake is pretty poor --Hospice RN here to reevaluate patient on 09/04/2022--- possible transfer to residential hospice/in the next 24 hours after reevaluation in a.m. by hospice team  Objective: Vitals:   09/03/22 0546 09/03/22 1308 09/03/22 2300 09/04/22 0537  BP: 120/63 (!) 113/55 134/70 (!) 143/64  Pulse: 91 (!) 53 63 62  Resp: '16  16 20  '$ Temp: 97.6 F (36.4 C)  (!) 97.4 F (36.3 C) (!) 97.4 F (36.3 C)  TempSrc: Oral  Oral Oral  SpO2: 99% 98% 100% 100%  Weight:      Height:        Intake/Output Summary (Last 24 hours) at 09/04/2022 1624 Last data filed at 09/04/2022 1600 Gross per 24 hour  Intake 340 ml  Output 100 ml  Net 240 ml   Filed Weights   08/27/22 0906  Weight: 43.2 kg   Examination:  General exam: frail, emaciated female, sleepy on and off,  hard of hearing, with dementia.   Respiratory system: Fair air movement, no wheezing Cardiovascular system: normal S1 & S2 heard. No pedal edema. Gastrointestinal system: Abdomen is ND, soft and NT, +BS, rectal tube removed on 09/02/2022 Central nervous system: Sleeping on and off, no new focal neurological deficits. Extremities: left leg rotated out shortened, discomfort with range of motion and palpation Skin: No rashes, lesions or ulcers. Psychiatry: Judgement and insight appear poor. Mood &  affect unable to determine. -Underlying cognitive and memory deficits consistent with dementia  Data Reviewed: I have personally reviewed following labs and imaging studies  CBC: Recent Labs  Lab 09/01/22 0359  WBC 7.8  HGB 11.4*  HCT 36.1  MCV 89.4  PLT 563    Basic Metabolic Panel: Recent Labs  Lab 09/01/22 0359  NA 137  K 3.8  CL 100  CO2 25  GLUCOSE 100*  BUN 13  CREATININE 0.70  CALCIUM 8.7*   Recent Results (from the past 240 hour(s))  Blood culture  (routine x 2)     Status: None   Collection Time: 08/27/22  9:23 AM   Specimen: BLOOD RIGHT FOREARM  Result Value Ref Range Status   Specimen Description   Final    BLOOD RIGHT FOREARM BOTTLES DRAWN AEROBIC AND ANAEROBIC   Special Requests Blood Culture adequate volume  Final   Culture   Final    NO GROWTH 5 DAYS Performed at [facility], [address], [place], [place] [address]    Report Status 09/01/2022 FINAL  Final  Blood culture (routine x 2)     Status: None   Collection Time: 08/27/22  2:40 PM   Specimen: BLOOD LEFT FOREARM  Result Value Ref Range Status   Specimen Description   Final    BLOOD LEFT FOREARM BOTTLES DRAWN AEROBIC AND ANAEROBIC   Special Requests Blood Culture adequate volume  Final   Culture   Final    NO GROWTH 5 DAYS Performed at [facility], [address] [place], [place] [address]    Report Status 09/01/2022 FINAL  Final    Radiology Studies: No results found.  Scheduled Meds:  acetaminophen  650 mg Oral Q6H   Or   acetaminophen  650 mg Rectal Q6H   scopolamine  1 patch Transdermal Q72H   Continuous Infusions:  lactated ringers 10 mL/hr at 08/30/22 1854    LOS: 8 days   Roxan Hockey, MD How to contact the [facility] Attending or Consulting provider [place] or covering provider during after hours [place], for this patient?  Check the care team in [facility] and look for a) attending/consulting TRH provider listed and b) the [facility] team listed Log into [email or web address] and use [place]'s universal password to access. If you do not have the password, please contact the hospital operator. Locate the [facility] provider you are looking for under [facility] and page to a number that you can be directly reached. If you still have difficulty reaching the provider, please page the [facility] (Director on Call) for the Hospitalists listed on amion for assistance.  09/04/2022, 4:24 PM

## 2022-09-04 NOTE — Progress Notes (Signed)
Palliative: Brianna [name] is lying quietly in bed.  She appears chronically ill and somewhat frail.  She greets me, making and somewhat keeping eye contact.  She has known memory loss and hearing loss.  She does not answer my orientation questions.  I do not believe that she can make her basic needs known.  There is no family at bedside at this time.  She is able to take a sip of liquid without overt signs of aspiration.  Brianna [name] has sustained a hip fracture that is deemed to be inoperable.  She is to be evaluated by hospice of [facility] for residential hospice placement.  Conference with attending, bedside nursing staff, transition of care team related to patient condition, needs, goals of care, disposition.  Plan: Awaiting evaluation by hospice of [facility] for residential hospice placement.  25 minutes Quinn Axe, NP Palliative medicine team Team phone [phone] Greater than 50% of this time was spent counseling and coordinating care related to the above assessment and plan.

## 2022-09-05 DIAGNOSIS — R2689 Other abnormalities of gait and mobility: Secondary | ICD-10-CM | POA: Diagnosis not present

## 2022-09-05 DIAGNOSIS — F0390 Unspecified dementia without behavioral disturbance: Secondary | ICD-10-CM | POA: Diagnosis not present

## 2022-09-05 DIAGNOSIS — F411 Generalized anxiety disorder: Secondary | ICD-10-CM | POA: Diagnosis not present

## 2022-09-05 DIAGNOSIS — I69391 Dysphagia following cerebral infarction: Secondary | ICD-10-CM | POA: Diagnosis not present

## 2022-09-05 DIAGNOSIS — M8008XD Age-related osteoporosis with current pathological fracture, vertebra(e), subsequent encounter for fracture with routine healing: Secondary | ICD-10-CM | POA: Diagnosis not present

## 2022-09-05 DIAGNOSIS — K5900 Constipation, unspecified: Secondary | ICD-10-CM | POA: Diagnosis not present

## 2022-09-05 DIAGNOSIS — D500 Iron deficiency anemia secondary to blood loss (chronic): Secondary | ICD-10-CM | POA: Diagnosis not present

## 2022-09-05 DIAGNOSIS — R5381 Other malaise: Secondary | ICD-10-CM | POA: Diagnosis not present

## 2022-09-05 DIAGNOSIS — M6281 Muscle weakness (generalized): Secondary | ICD-10-CM | POA: Diagnosis not present

## 2022-09-05 DIAGNOSIS — I739 Peripheral vascular disease, unspecified: Secondary | ICD-10-CM | POA: Diagnosis not present

## 2022-09-05 DIAGNOSIS — I10 Essential (primary) hypertension: Secondary | ICD-10-CM | POA: Diagnosis not present

## 2022-09-05 DIAGNOSIS — S72002A Fracture of unspecified part of neck of left femur, initial encounter for closed fracture: Secondary | ICD-10-CM | POA: Diagnosis not present

## 2022-09-05 DIAGNOSIS — J449 Chronic obstructive pulmonary disease, unspecified: Secondary | ICD-10-CM | POA: Diagnosis not present

## 2022-09-05 DIAGNOSIS — E039 Hypothyroidism, unspecified: Secondary | ICD-10-CM | POA: Diagnosis not present

## 2022-09-05 DIAGNOSIS — F03918 Unspecified dementia, unspecified severity, with other behavioral disturbance: Secondary | ICD-10-CM | POA: Diagnosis not present

## 2022-09-05 DIAGNOSIS — Z515 Encounter for palliative care: Secondary | ICD-10-CM

## 2022-09-05 DIAGNOSIS — S065X0D Traumatic subdural hemorrhage without loss of consciousness, subsequent encounter: Secondary | ICD-10-CM | POA: Diagnosis not present

## 2022-09-05 DIAGNOSIS — I69319 Unspecified symptoms and signs involving cognitive functions following cerebral infarction: Secondary | ICD-10-CM | POA: Diagnosis not present

## 2022-09-05 DIAGNOSIS — S065XAA Traumatic subdural hemorrhage with loss of consciousness status unknown, initial encounter: Secondary | ICD-10-CM | POA: Diagnosis not present

## 2022-09-05 DIAGNOSIS — S72002D Fracture of unspecified part of neck of left femur, subsequent encounter for closed fracture with routine healing: Secondary | ICD-10-CM | POA: Diagnosis not present

## 2022-09-05 DIAGNOSIS — E785 Hyperlipidemia, unspecified: Secondary | ICD-10-CM | POA: Diagnosis not present

## 2022-09-05 DIAGNOSIS — I7091 Generalized atherosclerosis: Secondary | ICD-10-CM | POA: Diagnosis not present

## 2022-09-05 DIAGNOSIS — B351 Tinea unguium: Secondary | ICD-10-CM | POA: Diagnosis not present

## 2022-09-05 DIAGNOSIS — Z7189 Other specified counseling: Secondary | ICD-10-CM

## 2022-09-05 DIAGNOSIS — M25552 Pain in left hip: Secondary | ICD-10-CM | POA: Diagnosis not present

## 2022-09-05 MED ORDER — LABETALOL HCL 5 MG/ML IV SOLN: 10.0000 mg | INTRAVENOUS | Status: DC | PRN

## 2022-09-05 MED ORDER — SCOPOLAMINE 1 MG/3DAYS TD PT72: 1.0000 | MEDICATED_PATCH | TRANSDERMAL | 12 refills | Status: DC

## 2022-09-05 MED ORDER — ACETAMINOPHEN 325 MG PO TABS: 650.0000 mg | ORAL_TABLET | ORAL | 2 refills | Status: DC | PRN

## 2022-09-05 MED ORDER — LORAZEPAM 2 MG/ML PO CONC: 1.0000 mg | Freq: Three times a day (TID) | ORAL | 0 refills | Status: DC | PRN

## 2022-09-05 MED ORDER — ENSURE COMPLETE PO LIQD: 237.0000 mL | Freq: Three times a day (TID) | ORAL | 3 refills | Status: DC

## 2022-09-05 MED ORDER — ONDANSETRON 4 MG PO TBDP: 4.0000 mg | ORAL_TABLET | Freq: Three times a day (TID) | ORAL | 0 refills | Status: DC | PRN

## 2022-09-05 MED ORDER — OXYCODONE HCL 5 MG/5ML PO SOLN: 5.0000 mg | ORAL | 0 refills | Status: DC | PRN

## 2022-09-05 NOTE — TOC Progression Note (Signed)
Transition of Care [facility]) - Progression Note    Patient Details  Name: Brianna [name] MRN: [id number] Date of Birth: Oct 17, 1925  Transition of Care [facility]) CM/SW Contact  Joaquin Courts, RN Phone Number: 09/05/2022, 12:05 PM  Clinical Narrative:    CM received call from [facility] of [facility] stating that patient is not appropriate for [place] at this time and is not appropriate for routine admission to [place] either.  CM updated MD and spoke with patient's daughter [name] and provided this information.  [name] reports she is at an appointment in [place] and will come to the hospital after this and would like to speak with TOC and provider at that time.  CM updated MD and will await notification that [name] is at the bedside to continue further dc planning discussions.   Expected Discharge Plan: [place] Barriers to Discharge: Continued Medical Work up  Expected Discharge Plan and Services Expected Discharge Plan: [place] arrangements for the past 2 months: Single Family Home                                       Social Determinants of Health (SDOH) Interventions    Readmission Risk Interventions    09/04/2022    3:23 PM  Readmission Risk Prevention Plan  Post Dischage Appt Not Complete  Appt Comments [place] will either go to SNF where medical director will assum PCP duties or to residential hospice.  Medication Screening Complete  Transportation Screening Complete

## 2022-09-05 NOTE — Progress Notes (Addendum)
Palliative: Chart review completed.  Mrs. Eckersley sustained a nonoperable hip fracture over a week ago.  Overall, she remains stable.  She is alert, making and mostly keeping eye contact.  She continues to take by mouth food and drink.  It does not seem that she qualifies for residential hospice care at this time.  It remains to be seen her ability to recover.  Although she has qualified for short-term rehab, it also remains to be seen her willingness/ability to participate in rehab.  At this point she may be best served by long-term care placement. Multiple face-to-face and by phone conversations have been held with Mrs. Gergely daughter/healthcare surrogate, Stevie Kern, who shares with the team that she has been told that she should not let the hospital team "push" her to make choices.   Addendum: Conference with attending, bedside nursing staff, transition of care team related to patient condition, needs, goals of care, disposition.  Plan: At this point continue to treat the treatable but no CPR or intubation.  No longer qualifies for residential hospice placement.  Mrs. Grothaus would benefit from long-term care placement.  No charge  Quinn Axe, NP Palliative medicine team Team phone [phone] Greater than 50% of this time was spent counseling and coordinating care related to the above assessment and plan.

## 2022-09-05 NOTE — Discharge Summary (Signed)
Brianna [name], is a 88 y.o. female  DOB January 29, 1925  MRN [id number].  Admission date:  08/27/2022  Admitting Physician  Murlean Iba, MD  Discharge Date:  09/05/2022   Primary MD  Claretta Fraise, MD  Recommendations for primary care physician for things to follow:  1)Encourage adequate oral intake---Please consider dietitian consult 2) Ensure or Boost nutritional supplements recommended 3)Please consider transition to Palliative care/Hospice consult if patient does not improve  Admission Diagnosis  SDH (subdural hematoma) ([place]) [S06.5XAA] Left displaced femoral neck fracture ([place]) [S72.002A] Closed fracture of left hip, initial encounter ([place]) [S72.002A] Fall, initial encounter [W19.XXXA] Sepsis, due to unspecified organism, unspecified whether acute organ dysfunction present [facility]) [A41.9]   Discharge Diagnosis  SDH (subdural hematoma) ([place]) [S06.5XAA] Left displaced femoral neck fracture ([place]) [S72.002A] Closed fracture of left hip, initial encounter ([place]) [S72.002A] Fall, initial encounter [W19.XXXA] Sepsis, due to unspecified organism, unspecified whether acute organ dysfunction present [facility]) [A41.9]    Principal Problem:   Left displaced femoral neck fracture ([place]) Active Problems:   Subdural hematoma ([place])   Surgical or procedure not carried out because of patient's decision   Arthritis   Essential hypertension, benign   COPD (chronic obstructive pulmonary disease) (HCC)   S/P right THA, AA   Expected blood loss anemia   Hyperlipidemia   Mitral valve disease   Colon cancer ([place])   Osteoporosis, post-menopausal   Thoracic aortic atherosclerosis (H[facility])   Hypothyroidism   Lactic acidosis   Hypokalemia   Obstipation   Hyperglycemia   DNR (do not resuscitate)   Hip pain   At risk for falling      Past Medical History:  Diagnosis Date   Bilateral inguinal hernia ([facility])     Carpal tunnel syndrome on right    Colon polyp    COPD (chronic obstructive pulmonary disease) ([place])    Essential hypertension    Fibrocystic breast    Frequency of urination    History of skin cancer    Hypothyroidism    Lumbosacral spondylosis without myelopathy    Mixed hyperlipidemia    Myxomatous mitral valve    Mild prolapse with eccentric, moderate to severe regurgitation   Obturator hernia, right    Osteoporosis    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, 2004   Symptomatic menopausal or female climacteric states    Ventral hernia     Past Surgical History:  Procedure Laterality Date   APPENDECTOMY     CATARACT EXTRACTION W/ INTRAOCULAR LENS  IMPLANT, BILATERAL     CESAREAN SECTION     COLON SURGERY  2004   8'04-open LAR for rectosigmoid cancer T1N0 within polyp   INGUINAL HERNIA REPAIR  11/21/2011   Procedure: LAPAROSCOPIC BILATERAL INGUINAL HERNIA REPAIR;  Surgeon: Adin Hector, MD;  Location: [facility];  Service: General;  Laterality: N/A;   LAPAROSCOPY  11/21/2011   R obturator hernia repair   TONSILLECTOMY     TOTAL HIP ARTHROPLASTY Right 03/04/2013   Procedure: RIGHT TOTAL HIP ARTHROPLASTY ANTERIOR  APPROACH;  Surgeon: Mauri Pole, MD;  Location: [facility];  Service: Orthopedics;  Laterality: Right;   VENTRAL HERNIA REPAIR  11/21/2011   Procedure: LAPAROSCOPIC VENTRAL HERNIA;  Surgeon: Adin Hector, MD;  Location: [facility];  Service: General;  Laterality: N/A;  laparoscopic bilateral inguinal hernia repair with mesh and ventral hernia repair with mesh     HPI  from the history and physical done on the day of admission:   Chief Complaint: Fall at home    HPI: Brianna [name] is a 88 year old female living with her daughter with dementia and very hard of hearing has not been acting her self for the last 3 to 4 days.  She has been wandering at home confused with very poor oral intake.  Patient was going to check the weather outside this morning and ended up falling on the  ground.  She fell onto her left side.  She has been crying out and confused since the fall.  Unsure if she hit her head or not.  She complains of left-sided hip pain and groin pain.  X-rays confirm an acute displaced left femoral neck fracture.  Head CT also reported a tiny subdural hematoma.  She was noted to be dehydrated.  There was a large amount of stool in the colon.  She was hypokalemic and had a lactic acid of 5.0.  Orthopedics Dr. Amedeo Kinsman was consulted who reported that this is a high risk fracture and patient is not a candidate for repair.  Family does not want to proceed with surgical repair given her core morbidities and that her last hip repair was a very difficult recovery process for her several years ago.  They agreed to making her DNR and to prefer the conservative management.  Her daughter can no longer care for her at home.  Her daughter has Parkinson's disease and unable to manage her in this condition.  Orthopedics recommended that patient be nonweightbearing for the next 4 to 6 weeks.  Patient is being admitted for further management.   Hospital Course:   Assessment and Plan: 1) Left Displaced Femoral Neck Fracture - - Discussed with orthopedics, high risk fracture and patient is a poor operative candidate and family is against surgical repair and elected to conservative management -Ortho recommends nonweightbearing 4 to 6 weeks - Given high mortality associated with this fracture,   -Physical therapy recommends SNF rehab --Discharge to SNF rehab as recommended by physical therapist -As per Hospice Team patient currently does Not meet criteria for routine admission to residential hospice house   Surgical or procedure not carried out because of patient's decision--patient is a very poor surgical candidate - Conservative management ONLY requested by daughter  -Physical therapy recommends SNF rehab ---Discharge to SNF rehab as recommended by physical therapist -As per Hospice Team  patient currently does Not meet criteria for routine admission to residential hospice house   Subdural hematoma ([place]) - Likely hit head with fall, trace amount of hemorrhage noted, planning to recheck CT head without contrast to follow-up, family against any invasive procedures or surgical management at this time -- repeated CT head on 08/28/22 did not show any findings of hemorrhage (daughter notified of results on 08/28/22    At risk for falling - Given dementia, fall precautions recommended,  -ambulate only with assistance with nonweightbearing to LLE .  HIGH FALL RISK.    Dysphagia- Speech pathologist consult appreciated -Recommends Dysphagia 2 (fine chop);Thin liquid  -Give Ensure supplements -Get dietitian consult  Social/ethics  -DNR (do not resuscitate) -Discussed with patient grandson and  daughter at bedside, given high mortality associated with untreated hip fracture, -Please see palliative consult notes --Scopolamine patch ordered for excessive secretions -As per family limited interventions at this time,  limit blood draws and other investigative studies --Discharge to SNF rehab as recommended by physical therapist -As per Hospice Team patient currently does Not meet criteria for routine admission to residential hospice house   Obstipation - Large amount of stool noted in the colon,  -Improved after treatment with with enema and laxatives  -- Hypokalemia -Repeat and has normalized   Mitral valve disease - Stable  Expected blood loss anemia--in the setting of hip fracture - conservative management only per family request   S/P right THA, AA - Daughter reports patient had a difficult time recovering from this surgery 10 years ago and against going forward with surgery for the acute fracture   COPD (chronic obstructive pulmonary disease) ([place]) - No flareup, stable   Disposition---- -Discharge to SNF rehab as recommended by physical therapist -As per Hospice  Team patient currently does Not meet criteria for routine admission to residential hospice house  Discharge Condition: Stable, overall prognosis is poor  Follow UP   Follow-up Information     Schedule an appointment as soon as possible for a visit  with [facility] with your PCP/Specialist as discussed.   Contact information: [email or web address] Call our physician referral line at [phone].                Consults obtained -orthopedics, palliative care, hospice  Diet and Activity recommendation:  As advised  Discharge Instructions    Discharge Instructions     Call MD for:  difficulty breathing, headache or visual disturbances   Complete by: As directed    Call MD for:  persistant dizziness or light-headedness   Complete by: As directed    Call MD for:  persistant nausea and vomiting   Complete by: As directed    Call MD for:  temperature >100.4   Complete by: As directed    Diet general   Complete by: As directed    1)Encourage adequate oral intake--- Please consider dietitian consult 2) Ensure or Boost nutritional supplements recommended   Discharge instructions   Complete by: As directed    1)Encourage adequate oral intake---Please consider dietitian consult 2) Ensure or Boost nutritional supplements recommended 3)Please consider transition to Palliative care/Hospice consult if patient does not improve   Increase activity slowly   Complete by: As directed         Discharge Medications     Allergies as of 09/05/2022       Reactions   Colchicine Diarrhea   Alendronate Sodium Nausea Only   Lodine [etodolac]    unknown   Risedronate Sodium    unknown        Medication List     STOP taking these medications    hydrochlorothiazide 12.5 MG capsule Commonly known as: MICROZIDE   levothyroxine 25 MCG tablet Commonly known as: SYNTHROID       TAKE these medications    acetaminophen 325 MG tablet Commonly known as:  Tylenol Take 2 tablets (650 mg total) by mouth every 4 (four) hours as needed.   feeding supplement (ENSURE COMPLETE) Liqd Take 237 mLs by mouth 3 (three) times daily.   LORazepam 2 MG/ML concentrated solution Commonly known as: LORazepam Intensol Take 0.5 mLs (1 mg total) by mouth every 8 (eight) hours as  needed for anxiety.   ondansetron 4 MG disintegrating tablet Commonly known as: ZOFRAN-ODT Take 1 tablet (4 mg total) by mouth every 8 (eight) hours as needed for nausea or vomiting.   oxyCODONE 5 MG/5ML solution Commonly known as: ROXICODONE Take 5 mLs (5 mg total) by mouth every 4 (four) hours as needed for severe pain.   scopolamine 1 MG/3DAYS Commonly known as: TRANSDERM-SCOP Place 1 patch (1.5 mg total) onto the skin every 3 (three) days.       Major procedures and Radiology Reports - PLEASE review detailed and final reports for all details, in brief -   CT HEAD WO CONTRAST (5MM)  Result Date: 08/28/2022 CLINICAL DATA:  88 year old female status post fall from chair with trace left side subdural hematoma yesterday. Left femoral neck fracture. EXAM: CT HEAD WITHOUT CONTRAST TECHNIQUE: Contiguous axial images were obtained from the base of the skull through the vertex without intravenous contrast. RADIATION DOSE REDUCTION: This exam was performed according to the departmental dose-optimization program which includes automated exposure control, adjustment of the mA and/or kV according to patient size and/or use of iterative reconstruction technique. COMPARISON:  CT head and cervical spine 08/27/2022. FINDINGS: Brain: Subtle left side low-density subdural collection is decreased in conspicuity, virtually resolved by CT. No new or hyperdense intracranial blood products identified. Stable ex vacuo appearing ventricular enlargement. No significant intracranial mass effect. Basilar cisterns remain patent. Stable gray-white matter differentiation throughout the brain. No cortically based  acute infarct identified. Vascular: Calcified atherosclerosis at the skull base. No suspicious intracranial vascular hyperdensity. Skull: Stable and intact. Sinuses/Orbits: Visualized paranasal sinuses and mastoids are stable and well aerated. Other: No acute orbit or scalp soft tissue injury identified. IMPRESSION: 1. The previously suspected trace left side subdural hematoma seems resolved by CT. 2. No skull fracture or acute intracranial abnormality. Electronically Signed   By: Genevie Ann M.D.   On: 08/28/2022 07:29   CT CHEST ABDOMEN PELVIS W CONTRAST  Result Date: 08/27/2022 CLINICAL DATA:  Sepsis. Elevated lactate. Patient fell from kitchen chair. EXAM: CT CHEST, ABDOMEN, AND PELVIS WITH CONTRAST TECHNIQUE: Multidetector CT imaging of the chest, abdomen and pelvis was performed following the standard protocol during bolus administration of intravenous contrast. RADIATION DOSE REDUCTION: This exam was performed according to the departmental dose-optimization program which includes automated exposure control, adjustment of the mA and/or kV according to patient size and/or use of iterative reconstruction technique. CONTRAST:  16m OMNIPAQUE IOHEXOL 300 MG/ML  SOLN COMPARISON:  None Available. FINDINGS: CT CHEST FINDINGS Cardiovascular: The heart is enlarged. Moderate atherosclerotic calcification is noted in the wall of the thoracic aorta. Enlargement of the pulmonary outflow tract/main pulmonary arteries suggests pulmonary arterial hypertension. Mediastinum/Nodes: No mediastinal lymphadenopathy. There is no hilar lymphadenopathy. The esophagus has normal imaging features. There is no axillary lymphadenopathy. Lungs/Pleura: Interlobular septal thickening noted in the upper lobes bilaterally with atelectasis in the right middle lobe and both lower lobes. No suspicious pulmonary nodule or mass. No pleural effusion. Musculoskeletal: Multiple nonacute left-sided rib fractures evident nonacute fractures posterior  tenth and eleventh ribs noted on the right. Multiple nonacute left rib fractures evident. CT ABDOMEN PELVIS FINDINGS Hepatobiliary: No suspicious focal abnormality within the liver parenchyma. Gallbladder is distended with layering tiny calcified stones in the fundal region. No intrahepatic or extrahepatic biliary dilation. Pancreas: Main pancreatic duct is diffusely prominent measuring up to 3 mm diameter. 4 mm hypodensity noted in the body of pancreas (62/2), likely cystic. Spleen: No splenomegaly. No focal mass lesion. Adrenals/Urinary  Tract: No adrenal nodule or mass. 15 mm low-density lesion interpolar right kidney is likely a cyst. No followup imaging is recommended. Left kidney unremarkable. Mild fullness noted in the right ureter. Bladder is markedly distended. Stomach/Bowel: Stomach is moderately distended with gas and fluid. Wall thickening in the antral region may be related to peristalsis although infection/inflammation could have this appearance. Duodenum is normally positioned as is the ligament of Treitz. No small bowel wall thickening. No small bowel dilatation. The terminal ileum is normal. The appendix is not well visualized, but there is no edema or inflammation in the region of the cecum. No gross colonic mass. No colonic wall thickening. Moderate to large stool volume noted. Vascular/Lymphatic: There is moderate atherosclerotic calcification of the abdominal aorta without aneurysm. There is no gastrohepatic or hepatoduodenal ligament lymphadenopathy. No retroperitoneal or mesenteric lymphadenopathy. No pelvic sidewall lymphadenopathy. Reproductive: There is no adnexal mass. Other: No intraperitoneal free fluid. Musculoskeletal: Status post right hip replacement. Left femoral neck fracture with varus angulation evident. No worrisome lytic or sclerotic osseous abnormality. Thoracolumbar scoliosis evident. IMPRESSION: 1. Left femoral neck fracture with varus angulation. 2. Interlobular septal  thickening in the upper lobes bilaterally suggesting component of edema. Atelectasis in the right middle lobe and both lower lobes. 3. Cardiomegaly. Enlargement of the pulmonary outflow tract/main pulmonary arteries suggests pulmonary arterial hypertension. 4. Cholelithiasis. 5. Wall thickening in the antral region may be related to peristalsis although infection/inflammation could have this appearance. 6. Moderate to large stool volume. Imaging features can be seen in the setting of clinical constipation. 7. 4 mm hypodensity in the body of the pancreas, likely a tiny cyst. Given patient age and lesion size, follow-up imaging in 2 years recommended to ensure stability. This recommendation follows ACR consensus guidelines: Management of Incidental Pancreatic Cysts: A White Paper of the ACR Incidental Findings Committee. J Am Coll Radiol 9381;82:993-716. 8.  Aortic Atherosclerosis (ICD10-I70.0). Electronically Signed   By: Misty Stanley M.D.   On: 08/27/2022 13:24   DG HIP UNILAT W OR W/O PELVIS 2-3 VIEWS LEFT  Result Date: 08/27/2022 CLINICAL DATA:  Fall. Left hip pain. Left hip fracture on current pelvis radiographs. EXAM: DG HIP (WITH OR WITHOUT PELVIS) 2-3V LEFT COMPARISON:  None Available. FINDINGS: Mildly displaced fracture of the mid left femoral neck, distal fracture component displacing superiorly by proximally 1.3 cm. Mild varus angulation. No other fracture.  Left hip joint is normally aligned. No bone lesion.  Skeletal structures are diffusely IMPRESSION: 1. Acute mildly displaced fracture of the left femoral neck with mild varus angulation. Electronically Signed   By: Lajean Manes M.D.   On: 08/27/2022 11:40   DG Pelvis 1-2 Views  Result Date: 08/27/2022 CLINICAL DATA:  Fall after trying to get out of a chair after breakfast this morning. Left hip pain. EXAM: PELVIS - 1-2 VIEW COMPARISON:  None Available. FINDINGS: Non comminuted, mildly displaced fracture of the left femoral neck, midcervical  to subcapital, distal component displaced superiorly by approximately 1.2 cm. Varus angulation. No other fractures. No bone lesions. Right hip total arthroplasty appears well seated and aligned. SI joints, pubic symphysis and left hip joint are normally spaced and aligned. Skeletal structures are diffusely demineralized. IMPRESSION: 1. Mildly displaced, varus angulated, non comminuted fracture of the left femoral neck. No dislocation. Electronically Signed   By: Lajean Manes M.D.   On: 08/27/2022 10:18   DG Chest 1 View  Result Date: 08/27/2022 CLINICAL DATA:  Fall after trying to get out of a chair  after breakfast this morning. Left hand pain. Short of breath. EXAM: CHEST  1 VIEW COMPARISON:  02/17/2020. FINDINGS: Mild to moderate enlargement of the cardiopericardial silhouette. No mediastinal or hilar masses. Bilateral interstitial thickening similar to the prior exam. Right lung base opacity suspected to be a combination of atelectasis and a probable small effusion. Remainder of the lungs is clear. No pneumothorax. Skeletal structures are demineralized, grossly intact. IMPRESSION: 1. No acute cardiopulmonary disease. Electronically Signed   By: Lajean Manes M.D.   On: 08/27/2022 10:16   DG Hand 2 View Left  Result Date: 08/27/2022 CLINICAL DATA:  Fall after trying to get out of a chair after breakfast this morning. Left hand pain. EXAM: LEFT HAND - 2 VIEW COMPARISON:  12/01/2021. FINDINGS: No fracture. Joints are normally aligned. There is joint space narrowing, subchondral sclerosis and marginal osteophytes involving multiple interphalangeal joints as well as the radial carpal and first carpal metacarpal articulations consistent with osteoarthritis. Skeletal structures are diffusely demineralized. Soft tissues are unremarkable. IMPRESSION: No fracture or dislocation. Electronically Signed   By: Lajean Manes M.D.   On: 08/27/2022 10:15   CT HEAD WO CONTRAST (5MM)  Addendum Date: 08/27/2022    ADDENDUM REPORT: 08/27/2022 10:02 ADDENDUM: Study discussed by telephone with Dr. Tretha Sciara on 08/27/2022 at 1001 hours. Electronically Signed   By: Genevie Ann M.D.   On: 08/27/2022 10:02   Result Date: 08/27/2022 CLINICAL DATA:  88 year old female status post fall from chair. EXAM: CT HEAD WITHOUT CONTRAST TECHNIQUE: Contiguous axial images were obtained from the base of the skull through the vertex without intravenous contrast. RADIATION DOSE REDUCTION: This exam was performed according to the departmental dose-optimization program which includes automated exposure control, adjustment of the mA and/or kV according to patient size and/or use of iterative reconstruction technique. COMPARISON:  Report of [facility] head CT 12/01/2021 (no images available). FINDINGS: Brain: Age associated cerebral volume loss and some ex vacuo ventricular enlargement. Suspicion of subtle 1-2 mm left side subdural hematoma is hypodense (series 4, image 35) and associated with similar trace rightward midline shift. But no other intracranial hemorrhage is identified. Basilar cisterns remain normal. Patchy and confluent bilateral cerebral white matter hypodensity. Mild heterogeneity in the deep gray matter nuclei. No cortically based acute infarct identified. Vascular: Calcified atherosclerosis at the skull base. Dominant distal left vertebral artery suspected. Skull: No acute osseous abnormality identified. Bone mineralization is within normal limits for age. Sinuses/Orbits: Visualized paranasal sinuses and mastoids are clear. Other: Postoperative changes to both globes. No acute orbit or scalp soft tissue injury identified. IMPRESSION: 1. Evidence of a trace 1-2 mm left side subdural hematoma, with associated trace rightward midline shift. 2. But no other intracranial hemorrhage, acute traumatic injury identified, or acute intracranial abnormality identified. Electronically Signed: By: Genevie Ann M.D. On: 08/27/2022  09:58   CT CERVICAL SPINE WO CONTRAST  Result Date: 08/27/2022 CLINICAL DATA:  88 year old female status post fall from chair. EXAM: CT CERVICAL SPINE WITHOUT CONTRAST TECHNIQUE: Multidetector CT imaging of the cervical spine was performed without intravenous contrast. Multiplanar CT image reconstructions were also generated. RADIATION DOSE REDUCTION: This exam was performed according to the departmental dose-optimization program which includes automated exposure control, adjustment of the mA and/or kV according to patient size and/or use of iterative reconstruction technique. COMPARISON:  Head CT today. Report of CT cervical spine 12/01/2021 [facility] (no images available). FINDINGS: Alignment: Cervicothoracic junction alignment is within normal limits. Degenerative appearing anterolisthesis C4-C5 (ankylosis  of that level) and C5-C6. Bilateral posterior element alignment is within normal limits. Skull base and vertebrae: Visualized skull base is intact. No atlanto-occipital dissociation. C1 and C2 appear intact and aligned. Some osteopenia in the cervical spine. No acute osseous abnormality identified. Soft tissues and spinal canal: No prevertebral fluid or swelling. No visible canal hematoma. Negative visible noncontrast neck soft tissues for age; calcified carotid atherosclerosis. Disc levels: Cervical spine degeneration superimposed on C4-C5 ankylosis. But capacious spinal canal. No CT evidence of spinal stenosis. Upper chest: Visible upper thoracic levels appear grossly intact. Lung apices are clear. IMPRESSION: 1. No acute traumatic injury identified in the cervical spine. 2. Degenerative appearing C4-C5 ankylosis.  Capacious spinal canal. Electronically Signed   By: Genevie Ann M.D.   On: 08/27/2022 10:02    Micro Results   Recent Results (from the past 240 hour(s))  Blood culture (routine x 2)     Status: None   Collection Time: 08/27/22  9:23 AM   Specimen: BLOOD RIGHT FOREARM  Result  Value Ref Range Status   Specimen Description   Final    BLOOD RIGHT FOREARM BOTTLES DRAWN AEROBIC AND ANAEROBIC   Special Requests Blood Culture adequate volume  Final   Culture   Final    NO GROWTH 5 DAYS Performed at [facility], [address], [place], [place] [address]    Report Status 09/01/2022 FINAL  Final  Blood culture (routine x 2)     Status: None   Collection Time: 08/27/22  2:40 PM   Specimen: BLOOD LEFT FOREARM  Result Value Ref Range Status   Specimen Description   Final    BLOOD LEFT FOREARM BOTTLES DRAWN AEROBIC AND ANAEROBIC   Special Requests Blood Culture adequate volume  Final   Culture   Final    NO GROWTH 5 DAYS Performed at [facility], [address], [place], [place] [address]    Report Status 09/01/2022 FINAL  Final   Today   Subjective    Ghalia Reicks today has no new complaints  - Oral intake is not great - -Discharge to SNF rehab as recommended by physical therapist -As per Hospice Team patient currently does Not meet criteria for routine admission to residential hospice house   Patient has been seen and examined prior to discharge   Objective   Blood pressure (!) 164/70, pulse 67, temperature (!) 96.8 F (36 C), resp. rate 16, height 5' (1.524 m), weight 43.2 kg, SpO2 100 %.   Intake/Output Summary (Last 24 hours) at 09/05/2022 1326 Last data filed at 09/05/2022 0900 Gross per 24 hour  Intake 125 ml  Output 600 ml  Net -475 ml    Exam Gen:- Awake Alert, no acute distress  HEENT:- Green.AT, No sclera icterus Ears- HOH Neck-Supple Neck,No JVD,.  Lungs-  CTAB , good air movement bilaterally CV- S1, S2 normal, regular Abd-  +ve B.Sounds, Abd Soft, No tenderness,     Extremities: Good pedal pulses, left leg rotated out shortened, discomfort with range of motion and palpation  Psych-underlying cognitive and memory deficits,  neuro-generalized weakness, mobility related challenges, no new focal deficits, no tremors    Data Review    CBC w Diff:  Lab Results  Component Value Date   WBC 7.8 09/01/2022   HGB 11.4 (L) 09/01/2022   HGB 10.4 (L) 06/28/2022   HCT 36.1 09/01/2022   HCT 31.7 (L) 06/28/2022   PLT 180 09/01/2022   PLT 298 06/28/2022   LYMPHOPCT 47 08/27/2022  MONOPCT 6 08/27/2022   EOSPCT 1 08/27/2022   BASOPCT 1 08/27/2022    CMP:  Lab Results  Component Value Date   NA 137 09/01/2022   NA 139 06/28/2022   K 3.8 09/01/2022   CL 100 09/01/2022   CO2 25 09/01/2022   BUN 13 09/01/2022   BUN 14 06/28/2022   CREATININE 0.70 09/01/2022   CREATININE 0.88 03/25/2013   PROT 6.9 08/27/2022   PROT 6.4 05/11/2022   ALBUMIN 3.1 (L) 08/27/2022   ALBUMIN 3.7 05/11/2022   BILITOT 0.5 08/27/2022   BILITOT 0.4 05/11/2022   ALKPHOS 82 08/27/2022   AST 37 08/27/2022   ALT 23 08/27/2022  . Total Discharge time is about 33 minutes  Roxan Hockey M.D on 09/05/2022 at 1:26 PM  Go to [email or web address] -  for contact info  [facility] - Office  [phone]

## 2022-09-05 NOTE — TOC Transition Note (Signed)
Transition of Care [facility]) - CM/SW Discharge Note   Patient Details  Name: Brianna [name] MRN: [id number] Date of Birth: 02-27-1925  Transition of Care (TOC) CM/SW Contact:  Joaquin Courts, RN Phone Number: 09/05/2022, 3:07 PM   Clinical Narrative:    CM and MD met with patient's daughter at bedside to discuss discharge planning.  Daughter expressed that she is in agreement with patient going to [facility] rehab for SNF.  Facility rep notified and bed assignment provided,  patient will discharge to room 415 bed 1.  Patient will be transported by EMS.  Daughter is aware of plan to discharge today.  No further TOC needs identified.  Final next level of care: Skilled Nursing Facility Barriers to Discharge: No Barriers Identified   Patient Goals and CMS Choice        Discharge Placement                       Discharge Plan and Services                                     Social Determinants of Health (SDOH) Interventions     Readmission Risk Interventions    09/04/2022    3:23 PM  Readmission Risk Prevention Plan  Post Dischage Appt Not Complete  Appt Comments Patinet will either go to SNF where medical director will assum PCP duties or to residential hospice.  Medication Screening Complete  Transportation Screening Complete

## 2022-09-05 NOTE — Plan of Care (Signed)
  Problem: Activity: Goal: Risk for activity intolerance will decrease Outcome: Not Progressing   Problem: Elimination: Goal: Will not experience complications related to bowel motility Outcome: Not Progressing   Problem: Pain Managment: Goal: General experience of comfort will improve Outcome: Not Progressing

## 2022-09-08 DIAGNOSIS — F03918 Unspecified dementia, unspecified severity, with other behavioral disturbance: Secondary | ICD-10-CM | POA: Diagnosis not present

## 2022-09-08 DIAGNOSIS — S72002A Fracture of unspecified part of neck of left femur, initial encounter for closed fracture: Secondary | ICD-10-CM | POA: Diagnosis not present

## 2022-09-08 DIAGNOSIS — F0390 Unspecified dementia without behavioral disturbance: Secondary | ICD-10-CM | POA: Diagnosis not present

## 2022-09-08 DIAGNOSIS — S065XAA Traumatic subdural hemorrhage with loss of consciousness status unknown, initial encounter: Secondary | ICD-10-CM | POA: Diagnosis not present

## 2022-09-11 DIAGNOSIS — F0390 Unspecified dementia without behavioral disturbance: Secondary | ICD-10-CM | POA: Diagnosis not present

## 2022-09-11 DIAGNOSIS — S72002A Fracture of unspecified part of neck of left femur, initial encounter for closed fracture: Secondary | ICD-10-CM | POA: Diagnosis not present

## 2022-09-11 DIAGNOSIS — I10 Essential (primary) hypertension: Secondary | ICD-10-CM | POA: Diagnosis not present

## 2022-09-11 DIAGNOSIS — S065XAA Traumatic subdural hemorrhage with loss of consciousness status unknown, initial encounter: Secondary | ICD-10-CM | POA: Diagnosis not present

## 2022-09-13 DIAGNOSIS — S065XAA Traumatic subdural hemorrhage with loss of consciousness status unknown, initial encounter: Secondary | ICD-10-CM | POA: Diagnosis not present

## 2022-09-13 DIAGNOSIS — S72002A Fracture of unspecified part of neck of left femur, initial encounter for closed fracture: Secondary | ICD-10-CM | POA: Diagnosis not present

## 2022-09-13 DIAGNOSIS — R5381 Other malaise: Secondary | ICD-10-CM | POA: Diagnosis not present

## 2022-09-16 DIAGNOSIS — B351 Tinea unguium: Secondary | ICD-10-CM | POA: Diagnosis not present

## 2022-09-16 DIAGNOSIS — I7091 Generalized atherosclerosis: Secondary | ICD-10-CM | POA: Diagnosis not present

## 2022-09-18 DIAGNOSIS — J449 Chronic obstructive pulmonary disease, unspecified: Secondary | ICD-10-CM | POA: Diagnosis not present

## 2022-09-18 DIAGNOSIS — E039 Hypothyroidism, unspecified: Secondary | ICD-10-CM | POA: Diagnosis not present

## 2022-09-18 DIAGNOSIS — I10 Essential (primary) hypertension: Secondary | ICD-10-CM | POA: Diagnosis not present

## 2022-09-18 DIAGNOSIS — R5381 Other malaise: Secondary | ICD-10-CM | POA: Diagnosis not present

## 2022-09-25 DIAGNOSIS — J449 Chronic obstructive pulmonary disease, unspecified: Secondary | ICD-10-CM | POA: Diagnosis not present

## 2022-09-25 DIAGNOSIS — F0390 Unspecified dementia without behavioral disturbance: Secondary | ICD-10-CM | POA: Diagnosis not present

## 2022-09-25 DIAGNOSIS — S72002A Fracture of unspecified part of neck of left femur, initial encounter for closed fracture: Secondary | ICD-10-CM | POA: Diagnosis not present

## 2022-09-25 DIAGNOSIS — Z79899 Other long term (current) drug therapy: Secondary | ICD-10-CM | POA: Diagnosis not present

## 2022-09-28 ENCOUNTER — Ambulatory Visit: Payer: Medicare Other | Admitting: Family Medicine

## 2022-10-30 DEATH — deceased
